# Patient Record
Sex: Female | Born: 1935 | Race: White | Hispanic: No | Smoking: Never smoker
Health system: Southern US, Community
[De-identification: ages and names within clinical notes are randomized; demographics above are authoritative.]

## PROBLEM LIST (undated history)

## (undated) DIAGNOSIS — Z923 Personal history of irradiation: Secondary | ICD-10-CM

## (undated) DIAGNOSIS — C801 Malignant (primary) neoplasm, unspecified: Secondary | ICD-10-CM

## (undated) DIAGNOSIS — I10 Essential (primary) hypertension: Secondary | ICD-10-CM

## (undated) DIAGNOSIS — E079 Disorder of thyroid, unspecified: Secondary | ICD-10-CM

## (undated) HISTORY — PX: EYE SURGERY: SHX253

## (undated) HISTORY — PX: ABDOMINAL HYSTERECTOMY: SHX81

## (undated) HISTORY — PX: BREAST BIOPSY: SHX20

## (undated) HISTORY — PX: HERNIA REPAIR: SHX51

## (undated) HISTORY — PX: KNEE SURGERY: SHX244

---

## 2005-01-23 ENCOUNTER — Other Ambulatory Visit: Payer: Self-pay

## 2005-01-23 ENCOUNTER — Emergency Department: Payer: Self-pay | Admitting: Emergency Medicine

## 2005-01-24 ENCOUNTER — Ambulatory Visit: Payer: Self-pay | Admitting: Emergency Medicine

## 2005-02-16 ENCOUNTER — Ambulatory Visit: Payer: Self-pay | Admitting: Internal Medicine

## 2005-08-05 ENCOUNTER — Ambulatory Visit: Payer: Self-pay | Admitting: Rheumatology

## 2006-02-20 ENCOUNTER — Ambulatory Visit: Payer: Self-pay | Admitting: Internal Medicine

## 2007-02-22 ENCOUNTER — Ambulatory Visit: Payer: Self-pay | Admitting: Internal Medicine

## 2007-03-17 ENCOUNTER — Ambulatory Visit: Payer: Self-pay | Admitting: Rheumatology

## 2007-04-12 ENCOUNTER — Ambulatory Visit: Payer: Self-pay | Admitting: General Practice

## 2007-04-12 ENCOUNTER — Other Ambulatory Visit: Payer: Self-pay

## 2007-04-19 ENCOUNTER — Ambulatory Visit: Payer: Self-pay | Admitting: General Practice

## 2008-02-13 ENCOUNTER — Other Ambulatory Visit: Payer: Self-pay

## 2008-02-13 ENCOUNTER — Emergency Department: Payer: Self-pay | Admitting: Emergency Medicine

## 2008-02-26 ENCOUNTER — Ambulatory Visit: Payer: Self-pay | Admitting: Internal Medicine

## 2009-02-26 ENCOUNTER — Ambulatory Visit: Payer: Self-pay | Admitting: Internal Medicine

## 2009-10-12 ENCOUNTER — Ambulatory Visit: Payer: Self-pay | Admitting: Rheumatology

## 2009-11-20 ENCOUNTER — Encounter: Admission: RE | Admit: 2009-11-20 | Discharge: 2009-11-20 | Payer: Self-pay | Admitting: Neurosurgery

## 2010-03-01 ENCOUNTER — Ambulatory Visit: Payer: Self-pay | Admitting: Internal Medicine

## 2010-03-08 ENCOUNTER — Ambulatory Visit: Payer: Self-pay | Admitting: General Practice

## 2010-03-24 ENCOUNTER — Inpatient Hospital Stay: Payer: Self-pay | Admitting: General Practice

## 2011-03-16 ENCOUNTER — Ambulatory Visit: Payer: Self-pay | Admitting: Internal Medicine

## 2011-03-19 ENCOUNTER — Ambulatory Visit: Payer: Self-pay | Admitting: Neurosurgery

## 2012-03-20 ENCOUNTER — Ambulatory Visit: Payer: Self-pay | Admitting: Internal Medicine

## 2012-10-04 ENCOUNTER — Ambulatory Visit: Payer: Self-pay | Admitting: Neurology

## 2012-10-05 ENCOUNTER — Ambulatory Visit: Payer: Self-pay | Admitting: Neurology

## 2012-10-09 ENCOUNTER — Ambulatory Visit: Payer: Self-pay | Admitting: Neurology

## 2012-11-05 ENCOUNTER — Ambulatory Visit: Payer: Self-pay | Admitting: Gastroenterology

## 2012-11-14 ENCOUNTER — Ambulatory Visit: Payer: Self-pay | Admitting: Gastroenterology

## 2012-12-03 ENCOUNTER — Ambulatory Visit: Payer: Self-pay | Admitting: Surgery

## 2012-12-03 DIAGNOSIS — I10 Essential (primary) hypertension: Secondary | ICD-10-CM

## 2012-12-03 LAB — CBC
HGB: 12.5 g/dL (ref 12.0–16.0)
MCHC: 34.8 g/dL (ref 32.0–36.0)
RBC: 4.01 10*6/uL (ref 3.80–5.20)
WBC: 5 10*3/uL (ref 3.6–11.0)

## 2012-12-14 ENCOUNTER — Inpatient Hospital Stay: Payer: Self-pay | Admitting: Surgery

## 2012-12-15 LAB — BASIC METABOLIC PANEL
Calcium, Total: 9 mg/dL (ref 8.5–10.1)
Chloride: 99 mmol/L (ref 98–107)
Creatinine: 0.62 mg/dL (ref 0.60–1.30)
EGFR (Non-African Amer.): >60
Glucose: 108 mg/dL — ABNORMAL HIGH (ref 65–99)
Potassium: 3.9 mmol/L (ref 3.5–5.1)
Sodium: 134 mmol/L — ABNORMAL LOW (ref 136–145)

## 2013-04-05 ENCOUNTER — Emergency Department: Payer: Self-pay | Admitting: Emergency Medicine

## 2013-04-05 LAB — CBC WITH DIFFERENTIAL/PLATELET
Basophil #: 0 10*3/uL (ref 0.0–0.1)
Eosinophil %: 4.6 %
HCT: 37.2 % (ref 35.0–47.0)
Lymphocyte #: 1.1 10*3/uL (ref 1.0–3.6)
Neutrophil %: 64.6 %
Platelet: 214 10*3/uL (ref 150–440)

## 2013-04-05 LAB — COMPREHENSIVE METABOLIC PANEL
Albumin: 3.2 g/dL — ABNORMAL LOW (ref 3.4–5.0)
Alkaline Phosphatase: 98 U/L (ref 50–136)
Anion Gap: 5 — ABNORMAL LOW (ref 7–16)
BUN: 14 mg/dL (ref 7–18)
Bilirubin,Total: 0.4 mg/dL (ref 0.2–1.0)
Chloride: 98 mmol/L (ref 98–107)
Co2: 30 mmol/L (ref 21–32)
Creatinine: 0.94 mg/dL (ref 0.60–1.30)
EGFR (African American): >60
EGFR (Non-African Amer.): 58 — ABNORMAL LOW
Osmolality: 267 (ref 275–301)
Total Protein: 6.7 g/dL (ref 6.4–8.2)

## 2013-04-05 LAB — URINALYSIS, COMPLETE
Bacteria: NONE SEEN
Bilirubin,UR: NEGATIVE
Blood: NEGATIVE
RBC,UR: 1 /HPF (ref 0–5)

## 2013-04-09 ENCOUNTER — Emergency Department: Payer: Self-pay | Admitting: Emergency Medicine

## 2013-04-09 LAB — URINALYSIS, COMPLETE
Bacteria: NONE SEEN
Glucose,UR: NEGATIVE mg/dL (ref 0–75)
Hyaline Cast: 3
Ketone: NEGATIVE
Protein: NEGATIVE

## 2013-04-09 LAB — MAGNESIUM: Magnesium: 1.4 mg/dL — ABNORMAL LOW

## 2013-04-09 LAB — COMPREHENSIVE METABOLIC PANEL
Albumin: 3.2 g/dL — ABNORMAL LOW (ref 3.4–5.0)
Alkaline Phosphatase: 100 U/L (ref 50–136)
Anion Gap: 5 — ABNORMAL LOW (ref 7–16)
EGFR (Non-African Amer.): 52 — ABNORMAL LOW
Glucose: 74 mg/dL (ref 65–99)

## 2013-04-09 LAB — CBC WITH DIFFERENTIAL/PLATELET
Basophil #: 0.1 10*3/uL (ref 0.0–0.1)
Eosinophil #: 0.6 10*3/uL (ref 0.0–0.7)
MCHC: 35 g/dL (ref 32.0–36.0)
MCV: 89 fL (ref 80–100)
Neutrophil #: 4.3 10*3/uL (ref 1.4–6.5)
Neutrophil %: 57.5 %
RBC: 4.09 10*6/uL (ref 3.80–5.20)
WBC: 7.4 10*3/uL (ref 3.6–11.0)

## 2013-04-10 LAB — WBCS, STOOL

## 2013-04-24 ENCOUNTER — Other Ambulatory Visit: Payer: Self-pay | Admitting: Gastroenterology

## 2013-04-24 LAB — CLOSTRIDIUM DIFFICILE BY PCR

## 2013-05-16 ENCOUNTER — Other Ambulatory Visit: Payer: Self-pay | Admitting: Gastroenterology

## 2013-05-16 LAB — CLOSTRIDIUM DIFFICILE BY PCR

## 2013-05-24 ENCOUNTER — Other Ambulatory Visit: Payer: Self-pay | Admitting: Gastroenterology

## 2013-05-24 LAB — CLOSTRIDIUM DIFFICILE BY PCR

## 2014-05-23 ENCOUNTER — Ambulatory Visit: Payer: Self-pay | Admitting: Family Medicine

## 2014-05-26 DIAGNOSIS — M5030 Other cervical disc degeneration, unspecified cervical region: Secondary | ICD-10-CM | POA: Insufficient documentation

## 2014-05-26 DIAGNOSIS — M5412 Radiculopathy, cervical region: Secondary | ICD-10-CM | POA: Insufficient documentation

## 2014-05-26 DIAGNOSIS — M47812 Spondylosis without myelopathy or radiculopathy, cervical region: Secondary | ICD-10-CM | POA: Insufficient documentation

## 2014-06-11 DIAGNOSIS — I10 Essential (primary) hypertension: Secondary | ICD-10-CM | POA: Diagnosis present

## 2014-06-11 DIAGNOSIS — K219 Gastro-esophageal reflux disease without esophagitis: Secondary | ICD-10-CM | POA: Insufficient documentation

## 2014-07-07 DIAGNOSIS — R002 Palpitations: Secondary | ICD-10-CM | POA: Insufficient documentation

## 2014-09-15 DIAGNOSIS — M1990 Unspecified osteoarthritis, unspecified site: Secondary | ICD-10-CM | POA: Insufficient documentation

## 2014-09-15 DIAGNOSIS — E039 Hypothyroidism, unspecified: Secondary | ICD-10-CM | POA: Diagnosis present

## 2014-11-28 DIAGNOSIS — M4807 Spinal stenosis, lumbosacral region: Secondary | ICD-10-CM | POA: Insufficient documentation

## 2014-11-28 DIAGNOSIS — M5416 Radiculopathy, lumbar region: Secondary | ICD-10-CM | POA: Insufficient documentation

## 2014-12-11 ENCOUNTER — Ambulatory Visit: Payer: Self-pay | Admitting: Family Medicine

## 2015-04-03 NOTE — Discharge Summary (Signed)
PATIENT NAME:  Jacqueline Tate, Jacqueline Tate MR#:  [id number] DATE OF BIRTH:  01-21-1931  DATE OF ADMISSION:  12/14/2012 DATE OF DISCHARGE:  12/17/2012  HISTORY OF PRESENT ILLNESS: This is a 81 year old female who has a history of epigastric pain and some dysphagia, heartburn and choking sensation. She has had endoscopic findings of large paraesophageal hiatus hernia and due to the magnitude of symptoms, repair was recommended for definitive treatment. Details of past medical history and medicines are recorded on the typed H and [place] [place]: She was brought in through the outpatient surgery department and went to the Operating Room, where she had a laparoscopic hiatus hernia repair and did use Gore-Tex Bio-A mesh and also did fundoplication.   Postoperatively, she was kept in the hospital for a period of 3 days of observation. She was begun on a liquid diet and gradually advanced to a full liquid diet, which she tolerated satisfactorily. She gradually increased her activities and walking, had minimal postoperative pain.   FINAL DIAGNOSES:  1.  Hiatus hernia. 2.  Gastroesophageal reflux.   OPERATION: Laparoscopic hiatus hernia repair with fundoplication.   DISCHARGE INSTRUCTIONS: Wound care instructions given. Glue remains intact. She may shower. She was advised to gradually advance her diet, chew thoroughly, also to continue her Dexilant for approximately a week post discharge, and plans made for followup in the office.   ____________________________ J. Rochel Brome, MD [email or web address] D: 12/27/2012 17:57:54 ET T: 12/27/2012 21:21:05 ET JOB#: [id number]  cc: Loreli Dollar, MD, <Dictator> Loreli Dollar MD ELECTRONICALLY SIGNED 12/28/2012 16:58

## 2015-04-03 NOTE — Op Note (Signed)
PATIENT NAME:  Jacqueline Tate, Jacqueline Tate MR#:  [id number] DATE OF BIRTH:  [birth year]  DATE OF PROCEDURE:  12/14/2012  PREOPERATIVE DIAGNOSIS: Paraesophageal hiatus hernia with chronic gastroesophageal reflux.   POSTOPERATIVE DIAGNOSIS: Paraesophageal hiatus hernia with chronic gastroesophageal reflux.   PROCEDURE: Laparoscopic hiatus hernia repair with Nissen fundoplication.   SURGEON: Rochel Brome, M.D.   ANESTHESIA: General.   INDICATIONS: This 81 year old female has been having problems with heartburn and choking sensation and episodes of epigastric pain gradually getting worse. She has been taking Dexalon  which helps with having heartburn. A barium swallow demonstrated a large paraesophageal hiatus hernia. It appeared that half or two thirds of her stomach extended up into the chest just anterior to the esophagus, and after evaluation surgery was recommended for definitive treatment.   DESCRIPTION OF PROCEDURE: The patient was placed on the operating table in the supine position under general endotracheal anesthesia. The abdomen was prepared with ChloraPrep and draped in a sterile manner.   A short incision was made in the inferior aspect of the umbilicus and carried down to the deep fascia which was grasped with laryngeal hook and elevated. A Veress needle was inserted, aspirated, and irrigated with a saline solution. Next, the peritoneal cavity was inflated with carbon dioxide. The Veress needle was removed. The 10 mm cannula was inserted. The 10 mm, 25 degrees laparoscope was inserted to view the peritoneal cavity. The liver appeared normal. The patient was placed in the reverse Trendelenburg position. Another incision was made in the subxiphoid position to insert an 11 mm cannula. Another incision was made in the left upper quadrant at the costal margin at the anterior axillary line to insert an 11 mm cannula. Another cannula was inserted in the right upper quadrant at the costal margin at the  midclavicular line, another midway between that point and the umbilicus, and ultimately another in the right upper quadrant at the anterior axillary line for a total of six cannulas. The fan retractor was introduced through the subxiphoid port and held in place with the Bookwalter mechanical arm to retract the left lobe of the liver in an anterior direction, exposing the diaphragmatic hiatus, most of the stomach, probably three-fourths of the stomach was in the chest. Babcock clamps were used to apply traction to the stomach, delivering a portion of it down into the abdomen. This subsequently needed to carry out this dissection to mobilize the stomach. The Harmonic scalpel was used to incise the gastrohepatic ligament down the right side and also incise the hernia sac circumferentially. The esophagus was found extending down into the posterior aspect of the hiatus, and with additional dissection, the stomach was mobilized down into the abdominal cavity, and circumferential dissection was carried out around the esophagus. The left and right crurea were identified. The hiatal hernia defect was large in size. Next, the repair was begun with 0 Surgidac sutures, figure-of-eight sutures, suturing the crurea together and beginning posteriorly and extending in an anterior direction. There was some tension appreciated, and I did make a relaxing incision in the crurea on the patient's right side which allowed better mobilization of the diaphragm, and the repair was continued; however, there was a point in which the diaphragm could no longer be brought together, as far dissecting more anteriorly and placing sutures, and it appeared that a portion of the gap would need to be bridged and I elected to use the Gore-Tex Bio-A 7 x 10 cm mesh. This did already come with the U-shaped slot cut  out to straddle the esophagus and this U-shaped slot was lengthened about another centimeter. The mesh was soaked and was inserted in place so  that the U-shaped slot faced anterior and to the left. The mesh was sutured to the crurea and also sutured to the left side of the diaphragm and subsequently the right side of the diaphragm and also between sutures applied multiple staples to further secure it to the diaphragm. This is also was sutured to the right of the relaxing incision in the right crus, and subsequently it was determined that the mesh was adequately secured. Next, the fundoplication was carried out, passing a portion of fundus posterior to the esophagus from left to right., and another portion of fundus brought adjacent to this. The fundus was plicated with interrupted 0 Surgidac simple sutures, placing 3, and incorporating the wall the esophagus into this plication. It is noted that hemostasis was intact. Blood loss for the entire case is very minimal and mostly in the skin edges. All the ports were removed. It is further noted that the patient did develop quite a bit of subcutaneous emphysema of the neck and I suspect that this was just carbon dioxide emphysema up through the mediastinum, and once the abdomen was deflated, we could soon determine that subcutaneous emphysema was starting to improve. Each of the skin incisions were closed with interrupted 5-0 chromic subcuticular sutures and Dermabond.      The patient was kept intubated as she was carried to the recovery room anticipating that the subcutaneous emphysema would soon resolve and soon be extubated. The patient tolerated the procedure satisfactorily other than that subcutaneous emphysema.    ____________________________ J. Rochel Brome, MD [email or web address] D: 12/14/2012 20:38:09 ET T: 12/15/2012 10:42:14 ET JOB#: [id number]  cc: Loreli Dollar, MD, <Dictator> Loreli Dollar MD ELECTRONICALLY SIGNED 12/17/2012 19:32

## 2015-07-18 ENCOUNTER — Emergency Department: Payer: Medicare Other

## 2015-07-18 ENCOUNTER — Other Ambulatory Visit: Payer: Self-pay

## 2015-07-18 ENCOUNTER — Encounter: Payer: Self-pay | Admitting: Emergency Medicine

## 2015-07-18 ENCOUNTER — Emergency Department
Admission: EM | Admit: 2015-07-18 | Discharge: 2015-07-18 | Disposition: A | Discharge status: Home / Self Care | Payer: Medicare Other | Attending: Emergency Medicine | Admitting: Emergency Medicine

## 2015-07-18 DIAGNOSIS — Z79899 Other long term (current) drug therapy: Secondary | ICD-10-CM | POA: Diagnosis not present

## 2015-07-18 DIAGNOSIS — K5732 Diverticulitis of large intestine without perforation or abscess without bleeding: Secondary | ICD-10-CM | POA: Insufficient documentation

## 2015-07-18 DIAGNOSIS — F419 Anxiety disorder, unspecified: Secondary | ICD-10-CM | POA: Insufficient documentation

## 2015-07-18 DIAGNOSIS — Z880 Allergy status to penicillin: Secondary | ICD-10-CM | POA: Diagnosis not present

## 2015-07-18 DIAGNOSIS — I10 Essential (primary) hypertension: Secondary | ICD-10-CM | POA: Insufficient documentation

## 2015-07-18 DIAGNOSIS — Z91040 Latex allergy status: Secondary | ICD-10-CM | POA: Insufficient documentation

## 2015-07-18 DIAGNOSIS — R109 Unspecified abdominal pain: Secondary | ICD-10-CM | POA: Diagnosis present

## 2015-07-18 HISTORY — DX: Essential (primary) hypertension: I10

## 2015-07-18 HISTORY — DX: Malignant (primary) neoplasm, unspecified: C80.1

## 2015-07-18 HISTORY — DX: Disorder of thyroid, unspecified: E07.9

## 2015-07-18 LAB — URINALYSIS COMPLETE WITH MICROSCOPIC (ARMC ONLY)
BILIRUBIN URINE: NEGATIVE
Bacteria, UA: NONE SEEN
GLUCOSE, UA: NEGATIVE mg/dL
Leukocytes, UA: NEGATIVE
NITRITE: NEGATIVE
PH: 7 (ref 5.0–8.0)
PROTEIN: 30 mg/dL — AB
SPECIFIC GRAVITY, URINE: 1.013 (ref 1.005–1.030)
Squamous Epithelial / LPF: NONE SEEN

## 2015-07-18 LAB — CBC WITH DIFFERENTIAL/PLATELET
BASOS ABS: 0.1 10*3/uL (ref 0–0.1)
BASOS PCT: 1 %
EOS ABS: 0.2 10*3/uL (ref 0–0.7)
EOS PCT: 2 %
HEMATOCRIT: 45.6 % (ref 35.0–47.0)
Hemoglobin: 16 g/dL (ref 12.0–16.0)
Lymphocytes Relative: 19 %
Lymphs Abs: 1.9 10*3/uL (ref 1.0–3.6)
MCH: 31.4 pg (ref 26.0–34.0)
MCHC: 35 g/dL (ref 32.0–36.0)
MCV: 89.7 fL (ref 80.0–100.0)
MONO ABS: 0.8 10*3/uL (ref 0.2–0.9)
MONOS PCT: 8 %
Neutro Abs: 7.3 10*3/uL — ABNORMAL HIGH (ref 1.4–6.5)
Neutrophils Relative %: 70 %
PLATELETS: 241 10*3/uL (ref 150–440)
RBC: 5.09 MIL/uL (ref 3.80–5.20)
RDW: 13.8 % (ref 11.5–14.5)
WBC: 10.3 10*3/uL (ref 3.6–11.0)

## 2015-07-18 LAB — BASIC METABOLIC PANEL
ANION GAP: 14 (ref 5–15)
BUN: 18 mg/dL (ref 6–20)
CALCIUM: 11.3 mg/dL — AB (ref 8.9–10.3)
CHLORIDE: 89 mmol/L — AB (ref 101–111)
CO2: 27 mmol/L (ref 22–32)
CREATININE: 0.8 mg/dL (ref 0.44–1.00)
GFR calc Af Amer: >60 mL/min (ref >60)
GFR calc non Af Amer: >60 mL/min (ref >60)
Glucose, Bld: 144 mg/dL — ABNORMAL HIGH (ref 65–99)
POTASSIUM: 2.8 mmol/L — AB (ref 3.5–5.1)
Sodium: 130 mmol/L — ABNORMAL LOW (ref 135–145)

## 2015-07-18 MED ORDER — IOHEXOL 350 MG/ML SOLN
75.0000 mL | Freq: Once | INTRAVENOUS | Status: AC | PRN
  Administered 2015-07-18: 75 mL via INTRAVENOUS

## 2015-07-18 MED ORDER — METRONIDAZOLE 500 MG PO TABS: 500.0000 mg | ORAL_TABLET | Freq: Three times a day (TID) | ORAL | Status: AC

## 2015-07-18 MED ORDER — LORAZEPAM 2 MG/ML IJ SOLN
0.5000 mg | Freq: Once | INTRAMUSCULAR | Status: AC
  Administered 2015-07-18: 0.5 mg via INTRAVENOUS
  Filled 2015-07-18 (×2): qty 1

## 2015-07-18 MED ORDER — CLONIDINE HCL 0.1 MG PO TABS: 0.1000 mg | ORAL_TABLET | Freq: Two times a day (BID) | ORAL | Status: DC | PRN

## 2015-07-18 MED ORDER — IOHEXOL 240 MG/ML SOLN
25.0000 mL | Freq: Once | INTRAMUSCULAR | Status: AC | PRN
  Administered 2015-07-18: 25 mL via ORAL

## 2015-07-18 MED ORDER — HYDROCODONE-ACETAMINOPHEN 5-325 MG PO TABS: 1.0000 | ORAL_TABLET | Freq: Four times a day (QID) | ORAL | Status: AC | PRN

## 2015-07-18 MED ORDER — POTASSIUM CHLORIDE CRYS ER 20 MEQ PO TBCR
20.0000 meq | EXTENDED_RELEASE_TABLET | Freq: Once | ORAL | Status: AC
  Administered 2015-07-18: 20 meq via ORAL
  Filled 2015-07-18: qty 1

## 2015-07-18 MED ORDER — CIPROFLOXACIN HCL 500 MG PO TABS
500.0000 mg | ORAL_TABLET | Freq: Two times a day (BID) | ORAL | Status: AC
Start: 2015-07-18 — End: 2015-07-25

## 2015-07-18 MED ORDER — MORPHINE SULFATE 2 MG/ML IJ SOLN
2.0000 mg | Freq: Once | INTRAMUSCULAR | Status: AC
  Administered 2015-07-18: 2 mg via INTRAVENOUS
  Filled 2015-07-18: qty 1

## 2015-07-18 MED ORDER — ONDANSETRON HCL 4 MG/2ML IJ SOLN
4.0000 mg | Freq: Once | INTRAMUSCULAR | Status: AC
  Administered 2015-07-18: 4 mg via INTRAVENOUS
  Filled 2015-07-18: qty 2

## 2015-07-18 NOTE — ED Notes (Signed)
Patient with no complaints at this time. Respirations even and unlabored. Skin warm/dry. Discharge instructions reviewed with patient at this time. Patient given opportunity to voice concerns/ask questions. IV removed per policy and band-aid applied to site. Patient discharged at this time and left Emergency Department, via wheelchair.   

## 2015-07-18 NOTE — Discharge Instructions (Signed)
Diverticulitis Diverticulitis is when small pockets that have formed in your colon (large intestine) become infected or swollen. HOME CARE  Follow your doctor's instructions.  Follow a special diet if told by your doctor.  When you feel better, your doctor may tell you to change your diet. You may be told to eat a lot of fiber. Fruits and vegetables are good sources of fiber. Fiber makes it easier to poop (have bowel movements).  Take supplements or probiotics as told by your doctor.  Only take medicines as told by your doctor.  Keep all follow-up visits with your doctor. GET HELP IF:  Your pain does not get better.  You have a hard time eating food.  You are not pooping like normal. GET HELP RIGHT AWAY IF:  Your pain gets worse.  Your problems do not get better.  Your problems suddenly get worse.  You have a fever.  You keep throwing up (vomiting).  You have bloody or black, tarry poop (stool). MAKE SURE YOU:   Understand these instructions.  Will watch your condition.  Will get help right away if you are not doing well or get worse. Document Released: 05/16/2008 Document Revised: 12/03/2013 Document Reviewed: 10/23/2013 [facility] Patient Information 2015 [place], [place]. This information is not intended to replace advice given to you by your health care provider. Make sure you discuss any questions you have with your health care provider.  Return immediately if condition worsens. Mostly liquid diet to rest the bowel. Return especially for increased pain, fever, uncontrolled blood pressure or any other new concerns. Only take the clonidine as needed for your blood pressure. He is continue on her normal blood pressure medications

## 2015-07-18 NOTE — ED Notes (Signed)
Pt complaining of her blood pressure being elevated , this AM with , also complaining of left flank pain and nausea

## 2015-07-18 NOTE — ED Provider Notes (Signed)
[facility] Emergency Department Provider Note  ____________________________________________  Time seen: 1900  I have reviewed the triage vital signs and the nursing notes.   HISTORY  Chief Complaint Flank Pain and Hypertension    HPI Jacqueline Tate is a 81 y.o. female was brought here by family for evaluation for possible urinary tract infection. Patient's also noted that her blood pressures been elevated throughout the day. She has taken her normal blood pressure medications along with some extra amlodipine. The patient signed describing some left-sided lower abdominal pain. She states she's had some urinary frequency without somewhat foul-smelling urine. She apparently was treated for urinary tract infection 3 weeks ago with antibiotics. Patient denies any fever or chills. Patient did have some nausea and vomited 2 prior to arrival without any biliary emesis or hematemesis. Patient denies any hematuria. She denies any chest pain or shortness of breath.    Past Medical History  Diagnosis Date  . Hypertension   . Thyroid disease   . Cancer     skin   urinary tract infection  There are no active problems to display for this patient.   Past Surgical History  Procedure Laterality Date  . Abdominal hysterectomy    . Eye surgery    . Hernia repair    . Knee surgery      Current Outpatient Rx  Name  Route  Sig  Dispense  Refill  . ciprofloxacin (CIPRO) 500 MG tablet   Oral   Take 1 tablet (500 mg total) by mouth 2 (two) times daily.   14 tablet   0   . cloNIDine (CATAPRES) 0.1 MG tablet   Oral   Take 1 tablet (0.1 mg total) by mouth 2 (two) times daily as needed.   14 tablet   11   . HYDROcodone-acetaminophen (NORCO) 5-325 MG per tablet   Oral   Take 1 tablet by mouth every 6 (six) hours as needed for moderate pain.   20 tablet   0   . metroNIDAZOLE (FLAGYL) 500 MG tablet   Oral   Take 1 tablet (500 mg total) by mouth 3 (three) times  daily.   21 tablet   0     Allergies Codeine; Latex; Penicillins; and Sulfa antibiotics  No family history on file.  Social History History  Substance Use Topics  . Smoking status: Never Smoker   . Smokeless tobacco: Not on file  . Alcohol Use: Not on file    Review of Systems  Constitutional: Negative for fever. Eyes: Negative for visual changes. ENT: Negative for sore throat Cardiovascular: Negative for chest pain. Respiratory: Negative for shortness of breath. Gastrointestinal: Patient describes left lower abdominal pain with some mild radiation of pain to the back region (see above). Genitourinary: She describes some burning and urinary frequency without hematuria and Musculoskeletal: Negative for back pain. Skin: Negative for rash. Neurological: Negative for headaches or focal weakness Psychiatric: Patient appears anxious and appears uncomfortable    ____________________________________________   PHYSICAL EXAM:  VITAL SIGNS: ED Triage Vitals  Enc Vitals Group     BP 07/18/15 1711 182/106 mmHg     Pulse Rate 07/18/15 1711 104     Resp 07/18/15 1711 20     Temp 07/18/15 1711 97.4 F (36.3 C)     Temp Source 07/18/15 1711 Oral     SpO2 07/18/15 1712 98 %     Weight 07/18/15 1712 150 lb (68.04 kg)     Height 07/18/15 1712  5\' 8"  (1.727 m)     Head Cir --      Peak Flow --      Pain Score 07/18/15 1713 8     Pain Loc --      Pain Edu? --      Excl. in [place]? --      Constitutional: Alert and oriented. Well appearing and in no distress. Eyes: Conjunctivae are normal.  ENT   Head: Normocephalic and atraumatic.   Mouth/Throat: Mucous membranes are moist. Cardiovascular: Normal rate, regular rhythm. Normal and symmetric distal pulses are present in all extremities. No murmurs, rubs, or gallops. Respiratory: Normal respiratory effort without tachypnea nor retractions. Breath sounds are clear and equal bilaterally.  Gastrointestinal: Mild tenderness to  deep palpation without any peritoneal signs such as rebound, guarding, or rigidity. Musculoskeletal: Nontender with normal range of motion in all extremities. No lower extremity tenderness nor edema. Neurologic:  Normal speech and language. No gross focal neurologic deficits are appreciated. Skin:  Skin is warm, dry and intact. No rash noted. Psychiatric: Mood and affect are normal. Patient exhibits appropriate insight and judgment.  ____________________________________________    LABS (pertinent positives/negatives)  Labs Reviewed  URINALYSIS COMPLETEWITH MICROSCOPIC ([facility] ONLY) - Abnormal; Notable for the following:    Color, Urine YELLOW (*)    APPearance CLOUDY (*)    Ketones, ur TRACE (*)    Hgb urine dipstick 1+ (*)    Protein, ur 30 (*)    All other components within normal limits  BASIC METABOLIC PANEL - Abnormal; Notable for the following:    Sodium 130 (*)    Potassium 2.8 (*)    Chloride 89 (*)    Glucose, Bld 144 (*)    Calcium 11.3 (*)    All other components within normal limits  CBC WITH DIFFERENTIAL/PLATELET - Abnormal; Notable for the following:    Neutro Abs 7.3 (*)    All other components within normal limits    ___Laboratory work was reviewed and noted to have significant hypokalemia. Patient was given 20 mEq of oral potassium here in emergency department _________________________________________   EKG  ED ECG REPORT I, Daymon Larsen, the attending physician, personally viewed and interpreted this ECG.  Date: 07/18/2015 EKG Time: 1743 Rate: 87 Rhythm: normal sinus rhythm QRS Axis: normal Intervals: normal ST/T Wave abnormalities: normal Conduction Disutrbances: none Narrative Interpretation: Left ventricular hypertrophy Old inferior infarct no acute ischemic changes are noted   ____________________________________________    RADIOLOGY I have personally reviewed any xrays that were ordered on this patient: Abdominal pelvic CT with  contrast   _______________________ IMPRESSION: 1. Suggestion of slight wall thickening along the descending colon, and slight soft tissue inflammation about the proximal sigmoid colon, concerning for mild diverticulitis. No evidence of perforation or abscess formation at this time. 2. Relatively diffuse diverticulosis along the descending and sigmoid colon. 3. Small right renal cyst seen. 4. Scattered calcification along the abdominal aorta and its branches._____________________   ____________________________________________   INITIAL IMPRESSION / ASSESSMENT AND PLAN / ED COURSE  Pertinent labs & imaging results that were available during my care of the patient were reviewed by me and considered in my medical decision making (see chart for details).  ED course: The patient's stay was uneventful. She was given some IV fluids and morphine for pain and felt symptomatically improved. Her blood pressure was decreased steadily since her arrival here to emergency department. (See nurses note). Patient I felt developed some left-sided lower abdominal pain which  likely increased her blood pressure. She normally has adequate control of her blood pressure and did have support backup medication she does not appear to have any signs or symptoms secondary to hypertensive emergency. Patient was given instructions on diverticulitis and that she'll need follow-up with her primary physician especially if she has any persistent pain and also recheck on her potassium levels. She has had hypokalemia in the past and usually resolves with bananas and other potassium-containing foods. Patient was given prescriptions for Cipro, Flagyl, Vicodin for pain, and clonidine as a secondary backup medication for hypertension.  ____________________________________________   FINAL CLINICAL IMPRESSION(S) / ED DIAGNOSES  Final diagnoses:  Diverticulitis of large intestine without perforation or abscess without bleeding    hypertensive urgency   Daymon Larsen, MD 07/18/15 2138

## 2015-07-18 NOTE — ED Notes (Signed)
Possible UTI with increased urination and increased smell

## 2015-07-18 NOTE — ED Notes (Signed)
Patient transported to CT 

## 2015-10-22 ENCOUNTER — Other Ambulatory Visit: Payer: Self-pay | Admitting: Family Medicine

## 2015-10-22 DIAGNOSIS — Z1231 Encounter for screening mammogram for malignant neoplasm of breast: Secondary | ICD-10-CM

## 2015-11-17 ENCOUNTER — Ambulatory Visit: Payer: PRIVATE HEALTH INSURANCE

## 2016-07-09 ENCOUNTER — Emergency Department: Payer: Medicare Other

## 2016-07-09 ENCOUNTER — Emergency Department
Admission: EM | Admit: 2016-07-09 | Discharge: 2016-07-09 | Disposition: A | Discharge status: Home / Self Care | Payer: Medicare Other | Attending: Emergency Medicine | Admitting: Emergency Medicine

## 2016-07-09 DIAGNOSIS — R000 Tachycardia, unspecified: Secondary | ICD-10-CM | POA: Diagnosis not present

## 2016-07-09 DIAGNOSIS — Z7982 Long term (current) use of aspirin: Secondary | ICD-10-CM | POA: Insufficient documentation

## 2016-07-09 DIAGNOSIS — Z91040 Latex allergy status: Secondary | ICD-10-CM | POA: Diagnosis not present

## 2016-07-09 DIAGNOSIS — Z791 Long term (current) use of non-steroidal anti-inflammatories (NSAID): Secondary | ICD-10-CM | POA: Diagnosis not present

## 2016-07-09 DIAGNOSIS — I10 Essential (primary) hypertension: Secondary | ICD-10-CM | POA: Diagnosis not present

## 2016-07-09 DIAGNOSIS — Z85828 Personal history of other malignant neoplasm of skin: Secondary | ICD-10-CM | POA: Diagnosis not present

## 2016-07-09 DIAGNOSIS — Z79899 Other long term (current) drug therapy: Secondary | ICD-10-CM | POA: Insufficient documentation

## 2016-07-09 DIAGNOSIS — R030 Elevated blood-pressure reading, without diagnosis of hypertension: Secondary | ICD-10-CM

## 2016-07-09 DIAGNOSIS — IMO0001 Reserved for inherently not codable concepts without codable children: Secondary | ICD-10-CM

## 2016-07-09 LAB — COMPREHENSIVE METABOLIC PANEL
ALK PHOS: 134 U/L — AB (ref 38–126)
ALT: 23 U/L (ref 14–54)
AST: 28 U/L (ref 15–41)
Albumin: 4.3 g/dL (ref 3.5–5.0)
Anion gap: 7 (ref 5–15)
BUN: 20 mg/dL (ref 6–20)
CALCIUM: 9.5 mg/dL (ref 8.9–10.3)
CHLORIDE: 102 mmol/L (ref 101–111)
CO2: 28 mmol/L (ref 22–32)
CREATININE: 0.93 mg/dL (ref 0.44–1.00)
GFR calc Af Amer: >60 mL/min (ref >60)
GFR calc non Af Amer: 55 mL/min — ABNORMAL LOW (ref >60)
GLUCOSE: 52 mg/dL — AB (ref 65–99)
Potassium: 3.3 mmol/L — ABNORMAL LOW (ref 3.5–5.1)
SODIUM: 137 mmol/L (ref 135–145)
Total Bilirubin: 0.3 mg/dL (ref 0.3–1.2)
Total Protein: 7.8 g/dL (ref 6.5–8.1)

## 2016-07-09 LAB — CBC
HCT: 41.5 % (ref 35.0–47.0)
HEMOGLOBIN: 14.7 g/dL (ref 12.0–16.0)
MCH: 32.7 pg (ref 26.0–34.0)
MCHC: 35.5 g/dL (ref 32.0–36.0)
MCV: 92.3 fL (ref 80.0–100.0)
PLATELETS: 197 10*3/uL (ref 150–440)
RBC: 4.49 MIL/uL (ref 3.80–5.20)
RDW: 13.5 % (ref 11.5–14.5)
WBC: 8.7 10*3/uL (ref 3.6–11.0)

## 2016-07-09 LAB — TROPONIN I: Troponin I: <0.03 ng/mL (ref <0.03)

## 2016-07-09 MED ORDER — SODIUM CHLORIDE 0.9 % IV SOLN
1000.0000 mL | Freq: Once | INTRAVENOUS | Status: AC
  Administered 2016-07-09: 1000 mL via INTRAVENOUS

## 2016-07-09 NOTE — ED Provider Notes (Signed)
[facility] Emergency Department Provider Note   ____________________________________________    I have reviewed the triage vital signs and the nursing notes.   HISTORY  Chief Complaint Tachycardia and Hypertension     HPI Jacqueline Tate is a 82 y.o. female who presents with complaints of high blood pressure and elevated heart rate. Patient reports she frequently checks her blood pressure in the morning and noted over the last 2 days it has been elevated. She checked it this morning because she "felt jittery" and it was elevated and her heart rate was also elevated. She decided to come to the emergency department to be "checked out". She denies chest pain. No palpitations. No shortness of breath. Overall she feels well now. She follows with her PCP who manages her blood pressure medications. No fevers or chills. No recent travel. No calf pain or swelling.   Past Medical History:  Diagnosis Date  . Cancer ([place])    skin  . Hypertension   . Thyroid disease     There are no active problems to display for this patient.   Past Surgical History:  Procedure Laterality Date  . ABDOMINAL HYSTERECTOMY    . EYE SURGERY    . HERNIA REPAIR    . KNEE SURGERY      Prior to Admission medications   Medication Sig Start Date End Date Taking? Authorizing Provider  amLODipine (NORVASC) 5 MG tablet Take 1 tablet by mouth daily. 06/15/16  Yes Historical Provider, MD  aspirin 81 MG tablet Take 81 mg by mouth daily.   Yes Historical Provider, MD  cyclobenzaprine (FLEXERIL) 5 MG tablet Take 5 mg by mouth at bedtime.   Yes Historical Provider, MD  docusate sodium (COLACE) 100 MG capsule Take 100 mg by mouth daily.   Yes Historical Provider, MD  gabapentin (NEURONTIN) 600 MG tablet Take 1 tablet by mouth 3 (three) times daily. 05/27/16  Yes Historical Provider, MD  levothyroxine (SYNTHROID, LEVOTHROID) 50 MCG tablet Take 1 tablet by mouth daily. 05/27/16  Yes Historical  Provider, MD  lisinopril-hydrochlorothiazide (PRINZIDE,ZESTORETIC) 20-12.5 MG tablet Take 1 tablet by mouth daily. 05/13/16  Yes Historical Provider, MD  meloxicam (MOBIC) 7.5 MG tablet Take 1 tablet by mouth 2 (two) times daily. 05/27/16  Yes Historical Provider, MD  metoCLOPramide (REGLAN) 10 MG tablet Take 5 mg by mouth daily. 05/27/16  Yes Historical Provider, MD  metoprolol tartrate (LOPRESSOR) 25 MG tablet Take 1 tablet by mouth 2 (two) times daily. 05/27/16  Yes Historical Provider, MD  Omeprazole 20 MG TBEC Take 10-20 mg by mouth daily. Take 10 mg by mouth in the morning, take 20 mg by mouth at night   Yes Historical Provider, MD  Probiotic Product ([place]) Take 1 capsule by mouth daily.   Yes Historical Provider, MD  traZODone (DESYREL) 50 MG tablet Take 50 mg by mouth daily. 04/19/16  Yes Historical Provider, MD  DULoxetine (CYMBALTA) 60 MG capsule Take 1 capsule by mouth daily. 05/27/16   Historical Provider, MD  fluticasone (FLONASE) 50 MCG/ACT nasal spray Place 1 spray into both nostrils daily as needed. 06/17/16   Historical Provider, MD  .   Allergies Codeine; Latex; Penicillins; and Sulfa antibiotics  History reviewed. No pertinent family history.  Social History Social History  Substance Use Topics  . Smoking status: Never Smoker  . Smokeless tobacco: Not on file  . Alcohol use Not on file    Review of Systems  Constitutional: No  fever/chills   Cardiovascular: Denies chest pain. Respiratory: Denies shortness of breath. Gastrointestinal: No abdominal pain.  No nausea, no vomiting.   Genitourinary: Negative for dysuria. Musculoskeletal: Negative for Neck pain Skin: Negative for rash. Neurological: Negative for headaches   10-point ROS otherwise negative.  ____________________________________________   PHYSICAL EXAM:  VITAL SIGNS: ED Triage Vitals  Enc Vitals Group     BP (!) 156/109     Pulse Rate (!) 107     Resp 18     Temp 97.8 F (36.6 C)      Temp Source Oral     SpO2 98 %     Weight 148 lb (67.1 kg)     Height 5\' 7"  (1.702 m)     Head Circumference      Peak Flow      Pain Score      Pain Loc      Pain Edu?      Excl. in [place]?     Constitutional: Alert and oriented. No acute distress. Pleasant and interactive Eyes: Conjunctivae are normal.  Head: Atraumatic. Nose: No congestion/rhinnorhea. Mouth/Throat: Mucous membranes are Dry Neck:  Painless ROM Cardiovascular: Tachycardia, regular rhythm. Grossly normal heart sounds.  Good peripheral circulation. Respiratory: Normal respiratory effort.  No retractions. Lungs CTAB. Gastrointestinal: Soft and nontender. No distention.  No CVA tenderness. Genitourinary: deferred Musculoskeletal: No lower extremity tenderness nor edema.  Warm and well perfused Neurologic:  Normal speech and language. No gross focal neurologic deficits are appreciated.  Skin:  Skin is warm, dry and intact. No rash noted. Psychiatric: Mood and affect are normal. Speech and behavior are normal.  ____________________________________________   LABS (all labs ordered are listed, but only abnormal results are displayed)  Labs Reviewed  COMPREHENSIVE METABOLIC PANEL - Abnormal; Notable for the following:       Result Value   Potassium 3.3 (*)    Glucose, Bld 52 (*)    Alkaline Phosphatase 134 (*)    GFR calc non Af Amer 55 (*)    All other components within normal limits  CBC  TROPONIN I  URINALYSIS COMPLETEWITH MICROSCOPIC ([facility] ONLY)   ____________________________________________  EKG  ED ECG REPORT I, Lavonia Drafts, the attending physician, personally viewed and interpreted this ECG.   Date: 07/09/2016   Rate: 110  Rhythm: sinus tachycardia  Axis: Normal  Intervals:none  ST&T Change: Nonspecific  ____________________________________________  RADIOLOGY  Chest x-ray unremarkable ____________________________________________   PROCEDURES  Procedure(s) performed:  No    Critical Care performed: No ____________________________________________   INITIAL IMPRESSION / ASSESSMENT AND PLAN / ED COURSE  Pertinent labs & imaging results that were available during my care of the patient were reviewed by me and considered in my medical decision making (see chart for details).  Patient well-appearing and in no distress. Her heart rate is mildly elevated upon arrival and her blood pressure is mildly elevated as well. Her exam is benign except she does have some dry mucous membranes which makes me suspect that she is mildly dehydrated. We will give IV fluids, check labs chest x-ray and reevaluate  Clinical Course  ----------------------------------------- 1:50 PM on 07/09/2016 -----------------------------------------  Heart rate 85 without intervention  ----------------------------------------- 2:59 PM on 07/09/2016 -----------------------------------------  Patient's workup is benign. On reexam she feels asymptomatic and very well. We discussed her lab work and agreed that outpatient follow-up was appropriate as needed for blood pressure management. Return precautions discussed ____________________________________________   FINAL CLINICAL IMPRESSION(S) / ED DIAGNOSES  Final diagnoses:  Elevated blood pressure      NEW MEDICATIONS STARTED DURING THIS VISIT:  New Prescriptions   No medications on file     Note:  This document was prepared using Dragon voice recognition software and may include unintentional dictation errors.    Lavonia Drafts, MD 07/09/16 1500

## 2016-07-09 NOTE — ED Notes (Signed)
Patient transported to X-ray 

## 2016-07-09 NOTE — ED Triage Notes (Signed)
Patient arrives to [facility] ed via POV from home with c/o hypertension and tachycardia. Patient regularly checks her HR/BP and noticed a change over the last two days with significant worsening today. Patient was concerned of elevation of HR>120 sustained and elevated BP sustained for 2+ days.   +ShOB +Weakness +Nausea

## 2016-09-28 ENCOUNTER — Other Ambulatory Visit: Payer: Self-pay | Admitting: Family Medicine

## 2016-09-28 DIAGNOSIS — Z1231 Encounter for screening mammogram for malignant neoplasm of breast: Secondary | ICD-10-CM

## 2016-11-16 ENCOUNTER — Ambulatory Visit
Admission: RE | Admit: 2016-11-16 | Discharge: 2016-11-16 | Disposition: A | Discharge status: Home / Self Care | Payer: Medicare Other | Source: Ambulatory Visit | Attending: Family Medicine | Admitting: Family Medicine

## 2016-11-16 DIAGNOSIS — Z1231 Encounter for screening mammogram for malignant neoplasm of breast: Secondary | ICD-10-CM

## 2017-02-20 ENCOUNTER — Ambulatory Visit: Payer: Medicare Other | Admitting: Radiation Oncology

## 2017-02-22 ENCOUNTER — Institutional Professional Consult (permissible substitution): Payer: Medicare Other | Admitting: Radiation Oncology

## 2017-02-27 ENCOUNTER — Ambulatory Visit: Payer: PRIVATE HEALTH INSURANCE | Admitting: Radiation Oncology

## 2017-02-28 ENCOUNTER — Emergency Department
Admission: EM | Admit: 2017-02-28 | Discharge: 2017-02-28 | Disposition: A | Discharge status: Other Acute Inpatient Hospital | Payer: Medicare Other | Attending: Emergency Medicine | Admitting: Emergency Medicine

## 2017-02-28 ENCOUNTER — Inpatient Hospital Stay (HOSPITAL_COMMUNITY)
Admission: AD | Admit: 2017-02-28 | Discharge: 2017-03-07 | DRG: 175 | Disposition: A | Discharge status: Home / Self Care | Payer: Medicare Other | Source: Other Acute Inpatient Hospital | Attending: Internal Medicine | Admitting: Internal Medicine

## 2017-02-28 ENCOUNTER — Inpatient Hospital Stay (HOSPITAL_COMMUNITY): Payer: Medicare Other

## 2017-02-28 ENCOUNTER — Emergency Department: Payer: Medicare Other

## 2017-02-28 DIAGNOSIS — I2510 Atherosclerotic heart disease of native coronary artery without angina pectoris: Secondary | ICD-10-CM | POA: Diagnosis present

## 2017-02-28 DIAGNOSIS — F419 Anxiety disorder, unspecified: Secondary | ICD-10-CM | POA: Diagnosis not present

## 2017-02-28 DIAGNOSIS — E039 Hypothyroidism, unspecified: Secondary | ICD-10-CM | POA: Diagnosis present

## 2017-02-28 DIAGNOSIS — I10 Essential (primary) hypertension: Secondary | ICD-10-CM | POA: Diagnosis present

## 2017-02-28 DIAGNOSIS — I272 Pulmonary hypertension, unspecified: Secondary | ICD-10-CM | POA: Diagnosis not present

## 2017-02-28 DIAGNOSIS — R0600 Dyspnea, unspecified: Secondary | ICD-10-CM | POA: Diagnosis present

## 2017-02-28 DIAGNOSIS — R739 Hyperglycemia, unspecified: Secondary | ICD-10-CM | POA: Diagnosis present

## 2017-02-28 DIAGNOSIS — Z7982 Long term (current) use of aspirin: Secondary | ICD-10-CM | POA: Insufficient documentation

## 2017-02-28 DIAGNOSIS — I959 Hypotension, unspecified: Secondary | ICD-10-CM | POA: Diagnosis present

## 2017-02-28 DIAGNOSIS — G4700 Insomnia, unspecified: Secondary | ICD-10-CM | POA: Diagnosis not present

## 2017-02-28 DIAGNOSIS — Z91040 Latex allergy status: Secondary | ICD-10-CM | POA: Diagnosis not present

## 2017-02-28 DIAGNOSIS — I2699 Other pulmonary embolism without acute cor pulmonale: Secondary | ICD-10-CM | POA: Diagnosis not present

## 2017-02-28 DIAGNOSIS — R0902 Hypoxemia: Secondary | ICD-10-CM | POA: Diagnosis not present

## 2017-02-28 DIAGNOSIS — I82491 Acute embolism and thrombosis of other specified deep vein of right lower extremity: Secondary | ICD-10-CM | POA: Diagnosis present

## 2017-02-28 DIAGNOSIS — I2609 Other pulmonary embolism with acute cor pulmonale: Secondary | ICD-10-CM | POA: Diagnosis present

## 2017-02-28 DIAGNOSIS — R0602 Shortness of breath: Secondary | ICD-10-CM | POA: Diagnosis present

## 2017-02-28 DIAGNOSIS — Z85820 Personal history of malignant melanoma of skin: Secondary | ICD-10-CM | POA: Insufficient documentation

## 2017-02-28 DIAGNOSIS — Z79899 Other long term (current) drug therapy: Secondary | ICD-10-CM | POA: Diagnosis not present

## 2017-02-28 DIAGNOSIS — K219 Gastro-esophageal reflux disease without esophagitis: Secondary | ICD-10-CM | POA: Diagnosis not present

## 2017-02-28 DIAGNOSIS — Z85828 Personal history of other malignant neoplasm of skin: Secondary | ICD-10-CM

## 2017-02-28 DIAGNOSIS — M79609 Pain in unspecified limb: Secondary | ICD-10-CM | POA: Diagnosis not present

## 2017-02-28 DIAGNOSIS — R0603 Acute respiratory distress: Secondary | ICD-10-CM | POA: Diagnosis not present

## 2017-02-28 DIAGNOSIS — I2602 Saddle embolus of pulmonary artery with acute cor pulmonale: Secondary | ICD-10-CM | POA: Diagnosis not present

## 2017-02-28 HISTORY — PX: IR GENERIC HISTORICAL: IMG1180011

## 2017-02-28 LAB — CBC
HEMATOCRIT: 41.2 % (ref 35.0–47.0)
HEMOGLOBIN: 14.2 g/dL (ref 12.0–16.0)
MCH: 32.2 pg (ref 26.0–34.0)
MCHC: 34.6 g/dL (ref 32.0–36.0)
MCV: 93 fL (ref 80.0–100.0)
Platelets: 193 10*3/uL (ref 150–440)
RBC: 4.43 MIL/uL (ref 3.80–5.20)
RDW: 14 % (ref 11.5–14.5)
WBC: 10.2 10*3/uL (ref 3.6–11.0)

## 2017-02-28 LAB — BASIC METABOLIC PANEL
ANION GAP: 8 (ref 5–15)
BUN: 29 mg/dL — ABNORMAL HIGH (ref 6–20)
CALCIUM: 8.8 mg/dL — AB (ref 8.9–10.3)
CO2: 21 mmol/L — ABNORMAL LOW (ref 22–32)
Chloride: 106 mmol/L (ref 101–111)
Creatinine, Ser: 0.99 mg/dL (ref 0.44–1.00)
GFR, EST AFRICAN AMERICAN: 59 mL/min — AB (ref >60)
GFR, EST NON AFRICAN AMERICAN: 51 mL/min — AB (ref >60)
Glucose, Bld: 187 mg/dL — ABNORMAL HIGH (ref 65–99)
Potassium: 4.3 mmol/L (ref 3.5–5.1)
Sodium: 135 mmol/L (ref 135–145)

## 2017-02-28 LAB — PROTIME-INR
INR: 1.11
Prothrombin Time: 14.3 seconds (ref 11.4–15.2)

## 2017-02-28 LAB — TROPONIN I: Troponin I: 0.06 ng/mL (ref <0.03)

## 2017-02-28 LAB — HEPARIN LEVEL (UNFRACTIONATED): HEPARIN UNFRACTIONATED: 0.99 [IU]/mL — AB (ref 0.30–0.70)

## 2017-02-28 LAB — APTT: aPTT: 31 seconds (ref 24–36)

## 2017-02-28 LAB — MRSA PCR SCREENING: MRSA by PCR: NEGATIVE

## 2017-02-28 LAB — BRAIN NATRIURETIC PEPTIDE: B Natriuretic Peptide: 1202 pg/mL — ABNORMAL HIGH (ref 0.0–100.0)

## 2017-02-28 MED ORDER — ZOLPIDEM TARTRATE 5 MG PO TABS
5.0000 mg | ORAL_TABLET | Freq: Every evening | ORAL | Status: DC | PRN
  Administered 2017-03-03 – 2017-03-06 (×2): 5 mg via ORAL
  Filled 2017-02-28 (×2): qty 1

## 2017-02-28 MED ORDER — SODIUM CHLORIDE 0.9 % IV SOLN: 250.0000 mL | INTRAVENOUS | Status: DC | PRN

## 2017-02-28 MED ORDER — METOCLOPRAMIDE HCL 5 MG PO TABS
5.0000 mg | ORAL_TABLET | Freq: Every day | ORAL | Status: DC
  Administered 2017-03-01 – 2017-03-07 (×7): 5 mg via ORAL
  Filled 2017-02-28 (×7): qty 1

## 2017-02-28 MED ORDER — PANTOPRAZOLE SODIUM 40 MG PO TBEC
40.0000 mg | DELAYED_RELEASE_TABLET | Freq: Every day | ORAL | Status: DC
  Administered 2017-03-01 – 2017-03-07 (×7): 40 mg via ORAL
  Filled 2017-02-28 (×7): qty 1

## 2017-02-28 MED ORDER — MIDAZOLAM HCL 2 MG/2ML IJ SOLN
INTRAMUSCULAR | Status: AC
  Filled 2017-02-28: qty 4

## 2017-02-28 MED ORDER — BISACODYL 5 MG PO TBEC
5.0000 mg | DELAYED_RELEASE_TABLET | Freq: Every day | ORAL | Status: DC | PRN
  Administered 2017-03-06: 5 mg via ORAL
  Filled 2017-02-28: qty 1

## 2017-02-28 MED ORDER — IOPAMIDOL (ISOVUE-300) INJECTION 61%
INTRAVENOUS | Status: AC
  Administered 2017-02-28: 25 mL
  Filled 2017-02-28: qty 100

## 2017-02-28 MED ORDER — LEVOTHYROXINE SODIUM 100 MCG IV SOLR: 25.0000 ug | Freq: Every day | INTRAVENOUS | Status: DC

## 2017-02-28 MED ORDER — FENTANYL CITRATE (PF) 100 MCG/2ML IJ SOLN
INTRAMUSCULAR | Status: AC
  Filled 2017-02-28: qty 2

## 2017-02-28 MED ORDER — DOCUSATE SODIUM 100 MG PO CAPS
100.0000 mg | ORAL_CAPSULE | Freq: Every day | ORAL | Status: DC
  Administered 2017-03-01 – 2017-03-07 (×5): 100 mg via ORAL
  Filled 2017-02-28 (×7): qty 1

## 2017-02-28 MED ORDER — ALPRAZOLAM 0.25 MG PO TABS
0.2500 mg | ORAL_TABLET | Freq: Every evening | ORAL | Status: DC | PRN
  Administered 2017-03-01: 0.25 mg via ORAL
  Filled 2017-02-28: qty 2
  Filled 2017-02-28: qty 1

## 2017-02-28 MED ORDER — SODIUM CHLORIDE 0.9 % IV BOLUS (SEPSIS)
500.0000 mL | Freq: Once | INTRAVENOUS | Status: AC
  Administered 2017-02-28: 500 mL via INTRAVENOUS

## 2017-02-28 MED ORDER — SODIUM CHLORIDE 0.9 % IV SOLN
12.0000 mg | Freq: Once | INTRAVENOUS | Status: AC
  Administered 2017-02-28: 12 mg via INTRAVENOUS
  Filled 2017-02-28: qty 12

## 2017-02-28 MED ORDER — GABAPENTIN 600 MG PO TABS
600.0000 mg | ORAL_TABLET | Freq: Three times a day (TID) | ORAL | Status: DC
  Administered 2017-03-01 – 2017-03-07 (×21): 600 mg via ORAL
  Filled 2017-02-28 (×23): qty 1

## 2017-02-28 MED ORDER — LORATADINE 10 MG PO TABS
10.0000 mg | ORAL_TABLET | Freq: Every day | ORAL | Status: DC
  Administered 2017-03-01 – 2017-03-07 (×7): 10 mg via ORAL
  Filled 2017-02-28 (×7): qty 1

## 2017-02-28 MED ORDER — LEVOTHYROXINE SODIUM 50 MCG PO TABS
50.0000 ug | ORAL_TABLET | Freq: Every day | ORAL | Status: DC
  Administered 2017-03-01 – 2017-03-07 (×7): 50 ug via ORAL
  Filled 2017-02-28 (×8): qty 1

## 2017-02-28 MED ORDER — IOPAMIDOL (ISOVUE-370) INJECTION 76%
75.0000 mL | Freq: Once | INTRAVENOUS | Status: AC | PRN
  Administered 2017-02-28: 75 mL via INTRAVENOUS

## 2017-02-28 MED ORDER — IOPAMIDOL (ISOVUE-300) INJECTION 61%
INTRAVENOUS | Status: AC
  Administered 2017-02-28: 1 mL
  Filled 2017-02-28: qty 100

## 2017-02-28 MED ORDER — HEPARIN (PORCINE) IN NACL 100-0.45 UNIT/ML-% IJ SOLN
1000.0000 [IU]/h | INTRAMUSCULAR | Status: DC
  Administered 2017-03-01: 950 [IU]/h via INTRAVENOUS
  Administered 2017-03-02 – 2017-03-06 (×5): 1000 [IU]/h via INTRAVENOUS
  Filled 2017-02-28 (×9): qty 250

## 2017-02-28 MED ORDER — HEPARIN (PORCINE) IN NACL 100-0.45 UNIT/ML-% IJ SOLN
1100.0000 [IU]/h | INTRAMUSCULAR | Status: DC
  Administered 2017-02-28: 1100 [IU]/h via INTRAVENOUS
  Filled 2017-02-28 (×3): qty 250

## 2017-02-28 MED ORDER — LIDOCAINE HCL (PF) 1 % IJ SOLN
INTRAMUSCULAR | Status: AC
  Filled 2017-02-28: qty 30

## 2017-02-28 MED ORDER — CYCLOBENZAPRINE HCL 10 MG PO TABS
5.0000 mg | ORAL_TABLET | Freq: Every day | ORAL | Status: DC
  Administered 2017-03-01 – 2017-03-06 (×6): 5 mg via ORAL
  Filled 2017-02-28 (×7): qty 1

## 2017-02-28 MED ORDER — DULOXETINE HCL 60 MG PO CPEP
60.0000 mg | ORAL_CAPSULE | Freq: Every day | ORAL | Status: DC
  Administered 2017-03-01 – 2017-03-06 (×6): 60 mg via ORAL
  Filled 2017-02-28 (×6): qty 1

## 2017-02-28 MED ORDER — FENTANYL CITRATE (PF) 100 MCG/2ML IJ SOLN
INTRAMUSCULAR | Status: AC | PRN
  Administered 2017-02-28: 25 ug via INTRAVENOUS

## 2017-02-28 MED ORDER — HEPARIN BOLUS VIA INFUSION
3350.0000 [IU] | Freq: Once | INTRAVENOUS | Status: AC
  Administered 2017-02-28: 3350 [IU] via INTRAVENOUS
  Filled 2017-02-28: qty 3350

## 2017-02-28 MED ORDER — PANTOPRAZOLE SODIUM 40 MG IV SOLR: 40.0000 mg | INTRAVENOUS | Status: DC

## 2017-02-28 MED ORDER — MIDAZOLAM HCL 2 MG/2ML IJ SOLN
INTRAMUSCULAR | Status: AC | PRN
  Administered 2017-02-28: 0.5 mg via INTRAVENOUS

## 2017-02-28 NOTE — Sedation Documentation (Signed)
R IF sheaths unremarkable, drsg CDI.

## 2017-02-28 NOTE — ED Provider Notes (Signed)
[facility] Emergency Department Provider Note  ____________________________________________  Time seen: Approximately 2:23 PM  I have reviewed the triage vital signs and the nursing notes.   HISTORY  Chief Complaint Weakness and Shortness of Breath   HPI Jacqueline Tate is a 83 y.o. female a history of hypertension, hypothyroidism, and skin cancer who presents for evaluation of shortness of breath. Patient reports4 days of shortness of breath worse with minimal exertion. Patient has also had sharp pleuritic moderate to severe pain located in her upper back between her shoulder blades for the same number of days. No cough or congestion, no chest pain. No leg pain or swelling. No prior history of PE. Yesterday she went to see her cardiologist who recommended an outpatient echo. Patient was feeling much worse today which prompted her visit to the emergency room. She denies personal or family history of blood clots.  Past Medical History:  Diagnosis Date  . Cancer ([place])    skin  . Hypertension   . Thyroid disease     Patient Active Problem List   Diagnosis Date Noted  . Pulmonary embolism ([place]) 02/28/2017    Past Surgical History:  Procedure Laterality Date  . ABDOMINAL HYSTERECTOMY    . BREAST BIOPSY Bilateral    years ago  . EYE SURGERY    . HERNIA REPAIR    . KNEE SURGERY      Prior to Admission medications   Medication Sig Start Date End Date Taking? Authorizing Provider  ALPRAZolam (XANAX) 0.25 MG tablet Take 0.25-0.5 mg by mouth at bedtime as needed for anxiety.   Yes Historical Provider, MD  amLODipine (NORVASC) 5 MG tablet Take 1 tablet by mouth daily. 06/15/16  Yes Historical Provider, MD  aspirin 81 MG tablet Take 81 mg by mouth daily.   Yes Historical Provider, MD  bisacodyl (DULCOLAX) 5 MG EC tablet Take 5 mg by mouth daily as needed for moderate constipation.   Yes Historical Provider, MD  cyclobenzaprine (FLEXERIL) 5 MG tablet Take 5  mg by mouth at bedtime.   Yes Historical Provider, MD  DULoxetine (CYMBALTA) 60 MG capsule Take 1 capsule by mouth daily. 05/27/16  Yes Historical Provider, MD  fluticasone (FLONASE) 50 MCG/ACT nasal spray Place 1 spray into both nostrils daily as needed. 06/17/16  Yes Historical Provider, MD  gabapentin (NEURONTIN) 600 MG tablet Take 1 tablet by mouth 3 (three) times daily. 05/27/16  Yes Historical Provider, MD  levothyroxine (SYNTHROID, LEVOTHROID) 50 MCG tablet Take 1 tablet by mouth daily. 05/27/16  Yes Historical Provider, MD  lisinopril (PRINIVIL,ZESTRIL) 20 MG tablet Take 20 mg by mouth daily.   Yes Historical Provider, MD  loratadine (CLARITIN) 10 MG tablet Take 10 mg by mouth daily.   Yes Historical Provider, MD  meloxicam (MOBIC) 7.5 MG tablet Take 1 tablet by mouth 2 (two) times daily. 05/27/16  Yes Historical Provider, MD  metoCLOPramide (REGLAN) 10 MG tablet Take 5 mg by mouth daily. 05/27/16  Yes Historical Provider, MD  metoprolol (LOPRESSOR) 50 MG tablet Take 1 tablet by mouth 2 (two) times daily. 05/27/16  Yes Historical Provider, MD  omeprazole (PRILOSEC) 20 MG capsule Take 20 mg by mouth 2 (two) times daily.    Yes Historical Provider, MD  Probiotic Product ([place]) Take 1 capsule by mouth daily.   Yes Historical Provider, MD  traZODone (DESYREL) 50 MG tablet Take 50 mg by mouth at bedtime.  04/19/16  Yes Historical Provider, MD  docusate  sodium (COLACE) 100 MG capsule Take 100 mg by mouth daily.    Historical Provider, MD  lisinopril-hydrochlorothiazide (PRINZIDE,ZESTORETIC) 20-12.5 MG tablet Take 1 tablet by mouth daily. 05/13/16   Historical Provider, MD    Allergies Codeine; Latex; Penicillins; and Sulfa antibiotics  Family History  Problem Relation Age of Onset  . Breast cancer Neg Hx     Social History Social History  Substance Use Topics  . Smoking status: Never Smoker  . Smokeless tobacco: Not on file  . Alcohol use Not on file    Review of  Systems  Constitutional: Negative for fever. Eyes: Negative for visual changes. ENT: Negative for sore throat. Neck: No neck pain  Cardiovascular: Negative for chest pain. Respiratory: + shortness of breath. Gastrointestinal: Negative for abdominal pain, vomiting or diarrhea. Genitourinary: Negative for dysuria. Musculoskeletal: + upper back pain. Skin: Negative for rash. Neurological: Negative for headaches, weakness or numbness. Psych: No SI or HI  ____________________________________________   PHYSICAL EXAM:  VITAL SIGNS: ED Triage Vitals  Enc Vitals Group     BP 02/28/17 1419 125/83     Pulse Rate 02/28/17 1419 (!) 107     Resp 02/28/17 1419 20     Temp 02/28/17 1419 97.7 F (36.5 C)     Temp Source 02/28/17 1419 Oral     SpO2 02/28/17 1419 96 %     Weight 02/28/17 1420 148 lb (67.1 kg)     Height --      Head Circumference --      Peak Flow --      Pain Score 02/28/17 1426 6     Pain Loc --      Pain Edu? --      Excl. in [place]? --     Constitutional: Alert and oriented, dyspneic.  HEENT:      Head: Normocephalic and atraumatic.         Eyes: Conjunctivae are normal. Sclera is non-icteric. EOMI. PERRL      Mouth/Throat: Mucous membranes are moist.       Neck: Supple with no signs of meningismus. Cardiovascular: Tachycardic with regular rhythm. No murmurs, gallops, or rubs. 2+ symmetrical distal pulses are present in all extremities. No JVD. Respiratory: Increased WOB, dyspneic, tachypenic, no hypoxia. Lungs are clear to auscultation bilaterally. No wheezes, crackles, or rhonchi.  Gastrointestinal: Soft, non tender, and non distended with positive bowel sounds. No rebound or guarding. Genitourinary: No CVA tenderness. Musculoskeletal: Nontender with normal range of motion in all extremities. No edema, cyanosis, or erythema of extremities. Neurologic: Normal speech and language. Face is symmetric. Moving all extremities. No gross focal neurologic deficits are  appreciated. Skin: Skin is warm, dry and intact. No rash noted. Psychiatric: Mood and affect are normal. Speech and behavior are normal.  ____________________________________________   LABS (all labs ordered are listed, but only abnormal results are displayed)  Labs Reviewed  BASIC METABOLIC PANEL - Abnormal; Notable for the following:       Result Value   CO2 21 (*)    Glucose, Bld 187 (*)    BUN 29 (*)    Calcium 8.8 (*)    GFR calc non Af Amer 51 (*)    GFR calc Af Amer 59 (*)    All other components within normal limits  TROPONIN I - Abnormal; Notable for the following:    Troponin I 0.06 (*)    All other components within normal limits  BRAIN NATRIURETIC PEPTIDE - Abnormal; Notable for the following:  B Natriuretic Peptide 1,202.0 (*)    All other components within normal limits  CBC  PROTIME-INR  APTT  URINALYSIS, COMPLETE (UACMP) WITH MICROSCOPIC  HEPARIN LEVEL (UNFRACTIONATED)  CBG MONITORING, ED   ____________________________________________  EKG  ED ECG REPORT I, Rudene Re, the attending physician, personally viewed and interpreted this ECG.  Sinus tachycardia, rate of 107,  incomplete right bundle branch block, right axis deviation, prolonged QTC, diffuse T-wave inversions in inferior, anterior and lateral leads which are unchanged from prior. ____________________________________________  RADIOLOGY  CTA chest: Positive for acute large bilateral pulmonary emboli with CT evidence of right heart strain (RV/LV Ratio = 1.6) consistent with at least submassive (intermediate risk) PE. The presence of right heart strain has been associated with an increased risk of morbidity and mortality. Please activate Code PE by paging [phone].  Aortic atherosclerosis.  Moderately large hiatal hernia.  Hazy parenchymal changes peripheral aspect right lower lobe possibly chronic or related to pulmonary embolus. Scarring/subsegmental atelectasis left lower  lobe/lingula. ____________________________________________   PROCEDURES  Procedure(s) performed: None Procedures Critical Care performed: yes  CRITICAL CARE Performed by: Rudene Re  ?  Total critical care time: 45 min  Critical care time was exclusive of separately billable procedures and treating other patients.  Critical care was necessary to treat or prevent imminent or life-threatening deterioration.  Critical care was time spent personally by me on the following activities: development of treatment plan with patient and/or surrogate as well as nursing, discussions with consultants, evaluation of patient's response to treatment, examination of patient, obtaining history from patient or surrogate, ordering and performing treatments and interventions, ordering and review of laboratory studies, ordering and review of radiographic studies, pulse oximetry and re-evaluation of patient's condition.  ____________________________________________   INITIAL IMPRESSION / ASSESSMENT AND PLAN / ED COURSE   83 y.o. female a history of hypertension, hypothyroidism, and skin cancer who presents for evaluation of shortness of breath and pleuritic upper back pain. Patient is very dyspneic, mildly tachypnea can tachycardic with mildly increased work of breathing and clear lungs concerning for pulmonary embolism versus pericardial effusion. CTA pending. EKG with no ischemic changes. Labs pending.  Clinical Course as of Feb 29 1912  Tue Feb 28, 2017  1531 CT showing bilateral submassive PE is with evidence of right heart strain. Troponin is elevated 0.06. Patient was started on fluids. Heparin has been ordered. Code PE has been activated.  [CV]  9629 Spoke with Dr. Alva Garnet, ICU MD at [facility] who recommended transferring patient for thrombolysis. IVF hanging. Pharmacy at the bedside for heparin initiation. Family updated. [facility] on their way to transport patient.  [CV]    Clinical Course User  Index [CV] Rudene Re, MD    Pertinent labs & imaging results that were available during my care of the patient were reviewed by me and considered in my medical decision making (see chart for details).    ____________________________________________   FINAL CLINICAL IMPRESSION(S) / ED DIAGNOSES  Final diagnoses:  Other acute pulmonary embolism with acute cor pulmonale (HCC)      NEW MEDICATIONS STARTED DURING THIS VISIT:  Discharge Medication List as of 02/28/2017  4:59 PM       Note:  This document was prepared using Dragon voice recognition software and may include unintentional dictation errors.    Rudene Re, MD 02/28/17 [phone]

## 2017-02-28 NOTE — Consult Note (Signed)
ANTICOAGULATION CONSULT NOTE - Initial Consult  Pharmacy Consult for heparin drip Indication: pulmonary embolus  Allergies  Allergen Reactions  . Codeine Nausea And Vomiting  . Latex   . Penicillins Rash    Has patient had a PCN reaction causing immediate rash, facial/tongue/throat swelling, SOB or lightheadedness with hypotension: Yes Has patient had a PCN reaction causing severe rash involving mucus membranes or skin necrosis: No Has patient had a PCN reaction that required hospitalization No Has patient had a PCN reaction occurring within the last 10 years: Yes If all of the above answers are "NO", then may proceed with Cephalosporin use.   . Sulfa Antibiotics Rash    Patient Measurements: Weight: 148 lb (67.1 kg) Heparin Dosing Weight: 67.1kg  Vital Signs: Temp: 97.7 F (36.5 C) (03/20 1419) Temp Source: Oral (03/20 1419) BP: 99/63 (03/20 1513) Pulse Rate: 84 (03/20 1513)  Labs:  Recent Labs  02/28/17 1415  HGB 14.2  HCT 41.2  PLT 193  CREATININE 0.99  TROPONINI 0.06*    CrCl cannot be calculated (Unknown ideal weight.).   Medical History: Past Medical History:  Diagnosis Date  . Cancer ([place])    skin  . Hypertension   . Thyroid disease     Medications:  Scheduled:  . heparin  3,350 Units Intravenous Once    Assessment: Pt is a 83 year old female who presents with SOB found to have a PE. Pharmacy consulted to dose heparin drip. CBC WNL. Baseline INR and APTT ordered as add on. Pt not on home anticoagulation.  Height per care everywhere =64 in  Goal of Therapy:  Heparin level 0.3-0.7 units/ml Monitor platelets by anticoagulation protocol: Yes   Plan:  Give 3350 units bolus x 1 Start heparin infusion at 1100 units/hr Check anti-Xa level in 8 hours and daily while on heparin Continue to monitor H&H and platelets  Melissa D Maccia, Pharm.D, BCPS Clinical Pharmacist  02/28/2017,3:39 PM

## 2017-02-28 NOTE — Sedation Documentation (Signed)
6Fr. sheat X2 to R IJ. Sutured, gauze/tegaderm bandage, CDI. NS syringe for flush, will connect to IVF upon arrival to unit.

## 2017-02-28 NOTE — ED Notes (Signed)
Pt taken to CT via stretcher.

## 2017-02-28 NOTE — Sedation Documentation (Signed)
Patient denies pain and is resting comfortably.  

## 2017-02-28 NOTE — ED Notes (Signed)
Called pharmacy to get heparin sent to ED

## 2017-02-28 NOTE — Procedures (Signed)
  Pre-operative Diagnosis: Bilateral PE with right heart strain       Post-operative Diagnosis:  Bilateral PE with right heart strain   Indications: Submassive PE with right heart strain  Procedure: Pulmonary arteriogram with placement of bilateral EKOS PA catheters for thrombolytic infusion.  PA pressures.  Findings: Elevated PA pressures:  RT PA: 51/25 (35 mean);  LT PA: 48/21 (mean 33)  Bilateral catheters directed into lower lobe pulmonary arteries  Complications: None     EBL: Minimal  Plan: Start 12 hour ultrasound-assisted thrombolysis with TPA.  Total of 2 mg TPA per hour.

## 2017-02-28 NOTE — Consult Note (Signed)
Chief Complaint: Patient was seen in consultation today for submassive pulmonary emboli at the request of Critical Care  Referring Physician(s): Merton Border   Patient Status: [facility] - In-pt  History of Present Illness: Jacqueline Tate is a 83 y.o. female with progressive shortness of breath and fatigue over the past weeks.  Patient was recently seen by Cardiologist and reportedly she was hypotensive and an echocardiogram was ordered but not yet performed.  Shortness of breath progressed and patient went to [facility] ED today.  Chest CTA demonstrated large bilateral emboli with evidence for significant right heart strain.  Patient was started on heparin and transferred to [facility] for potential catheter-directed thrombolysis with EKOS.  Patient is still complaining of shortness of breath with minimal exertion but she is resting comfortable when she is immobile in bed.  Patient has history of skin cancer but no other malignancy.  She has been complaining of dizziness but no significant falls or injuries in past few months.  She had a knot behind the left knee and says that it has resolved.  Past surgical history is significant for previous left knee surgery years ago and prior hiatal hernia surgery.  Past Medical History:  Diagnosis Date  . Cancer ([place])    skin  . Hypertension   . Thyroid disease     Past Surgical History:  Procedure Laterality Date  . ABDOMINAL HYSTERECTOMY    . BREAST BIOPSY Bilateral    years ago  . EYE SURGERY    . HERNIA REPAIR    . KNEE SURGERY      Allergies: Codeine; Latex; Penicillins; and Sulfa antibiotics  Medications: Prior to Admission medications   Medication Sig Start Date End Date Taking? Authorizing Provider  ALPRAZolam (XANAX) 0.25 MG tablet Take 0.25-0.5 mg by mouth at bedtime as needed for anxiety.    Historical Provider, MD  amLODipine (NORVASC) 5 MG tablet Take 1 tablet by mouth daily. 06/15/16   Historical Provider, MD  aspirin 81 MG  tablet Take 81 mg by mouth daily.    Historical Provider, MD  bisacodyl (DULCOLAX) 5 MG EC tablet Take 5 mg by mouth daily as needed for moderate constipation.    Historical Provider, MD  cyclobenzaprine (FLEXERIL) 5 MG tablet Take 5 mg by mouth at bedtime.    Historical Provider, MD  docusate sodium (COLACE) 100 MG capsule Take 100 mg by mouth daily.    Historical Provider, MD  DULoxetine (CYMBALTA) 60 MG capsule Take 1 capsule by mouth daily. 05/27/16   Historical Provider, MD  fluticasone (FLONASE) 50 MCG/ACT nasal spray Place 1 spray into both nostrils daily as needed. 06/17/16   Historical Provider, MD  gabapentin (NEURONTIN) 600 MG tablet Take 1 tablet by mouth 3 (three) times daily. 05/27/16   Historical Provider, MD  levothyroxine (SYNTHROID, LEVOTHROID) 50 MCG tablet Take 1 tablet by mouth daily. 05/27/16   Historical Provider, MD  lisinopril (PRINIVIL,ZESTRIL) 20 MG tablet Take 20 mg by mouth daily.    Historical Provider, MD  lisinopril-hydrochlorothiazide (PRINZIDE,ZESTORETIC) 20-12.5 MG tablet Take 1 tablet by mouth daily. 05/13/16   Historical Provider, MD  loratadine (CLARITIN) 10 MG tablet Take 10 mg by mouth daily.    Historical Provider, MD  meloxicam (MOBIC) 7.5 MG tablet Take 1 tablet by mouth 2 (two) times daily. 05/27/16   Historical Provider, MD  metoCLOPramide (REGLAN) 10 MG tablet Take 5 mg by mouth daily. 05/27/16   Historical Provider, MD  metoprolol (LOPRESSOR) 50 MG tablet Take  1 tablet by mouth 2 (two) times daily. 05/27/16   Historical Provider, MD  omeprazole (PRILOSEC) 20 MG capsule Take 20 mg by mouth 2 (two) times daily.     Historical Provider, MD  Probiotic Product ([place]) Take 1 capsule by mouth daily.    Historical Provider, MD  traZODone (DESYREL) 50 MG tablet Take 50 mg by mouth at bedtime.  04/19/16   Historical Provider, MD     Family History  Problem Relation Age of Onset  . Breast cancer Neg Hx     Social History   Social History  .  Marital status: Married    Spouse name: N/A  . Number of children: N/A  . Years of education: N/A   Social History Main Topics  . Smoking status: Never Smoker  . Smokeless tobacco: Not on file  . Alcohol use Not on file  . Drug use: Unknown  . Sexual activity: Not on file   Other Topics Concern  . Not on file   Social History Narrative  . No narrative on file     Review of Systems  Constitutional: Positive for activity change and fatigue.  Respiratory: Positive for shortness of breath.   Cardiovascular: Negative for leg swelling.    Vital Signs: BP 114/77 (BP Location: Right Arm)   Pulse 98   Temp 97.8 F (36.6 C) (Oral)   Resp (!) 21   Ht 5\' 5"  (1.651 m)   Wt 156 lb 8.4 oz (71 kg)   SpO2 97%   BMI 26.05 kg/m   Physical Exam  Constitutional: She is oriented to person, place, and time.  Cardiovascular: Regular rhythm and normal heart sounds.   Tachycardiac  Pulmonary/Chest: Effort normal and breath sounds normal.  Abdominal: Soft. Bowel sounds are normal. She exhibits no distension. There is no tenderness.  Neurological: She is alert and oriented to person, place, and time.    Mallampati Score: 2     Imaging: Ct Angio Chest Pe W And/or Wo Contrast  Result Date: 02/28/2017 CLINICAL DATA:  83 year old female with shortness of breath. Initial encounter. EXAM: CT ANGIOGRAPHY CHEST WITH CONTRAST TECHNIQUE: Multidetector CT imaging of the chest was performed using the standard protocol during bolus administration of intravenous contrast. Multiplanar CT image reconstructions and MIPs were obtained to evaluate the vascular anatomy. CONTRAST:  75 cc Isovue 370. COMPARISON:  None. FINDINGS: Cardiovascular: Large bilateral pulmonary emboli with evidence of right heart strain. Coronary artery calcifications. Atherosclerotic changes thoracic aorta. Exam not optimized to evaluate the aorta although no obvious dissection. Mediastinum/Nodes: Moderately large hiatal hernia.  Lungs/Pleura: Hazy parenchymal changes peripheral aspect right lower lobe possibly chronic or related to pulmonary embolus. Scarring/subsegmental atelectasis left lower lobe/lingula. Upper Abdomen: Prominent contrast hepatic veins may be related to increased right heart pressure. Musculoskeletal: Degenerative changes lower thoracic/ upper lumbar spine and lower cervical spine. Review of the MIP images confirms the above findings. IMPRESSION: Positive for acute large bilateral pulmonary emboli with CT evidence of right heart strain (RV/LV Ratio = 1.6) consistent with at least submassive (intermediate risk) PE. The presence of right heart strain has been associated with an increased risk of morbidity and mortality. Please activate Code PE by paging [phone]. Aortic atherosclerosis. Moderately large hiatal hernia. Hazy parenchymal changes peripheral aspect right lower lobe possibly chronic or related to pulmonary embolus. Scarring/subsegmental atelectasis left lower lobe/lingula. These results were called by telephone at the time of interpretation on 02/28/2017 at 3:14 pm to Dr. Rudene Re ,  who verbally acknowledged these results. Electronically Signed   By: Genia Del M.D.   On: 02/28/2017 15:24    Labs:  CBC:  Recent Labs  07/09/16 1304 02/28/17 1415  WBC 8.7 10.2  HGB 14.7 14.2  HCT 41.5 41.2  PLT 197 193    COAGS:  Recent Labs  02/28/17 1421  INR 1.11  APTT 31    BMP:  Recent Labs  07/09/16 1304 02/28/17 1415  NA 137 135  K 3.3* 4.3  CL 102 106  CO2 28 21*  GLUCOSE 52* 187*  BUN 20 29*  CALCIUM 9.5 8.8*  CREATININE 0.93 0.99  GFRNONAA 55* 51*  GFRAA >60 59*    LIVER FUNCTION TESTS:  Recent Labs  07/09/16 1304  BILITOT 0.3  AST 28  ALT 23  ALKPHOS 134*  PROT 7.8  ALBUMIN 4.3    TUMOR MARKERS: No results for input(s): AFPTM, CEA, CA199, CHROMGRNA in the last 8760 hours.  Assessment and Plan:  83 yo with large bilateral pulmonary emboli and  right heart strain.  Findings compatible with a submassive PE.  Patient would be a candidate for ultrasound-assistant PE thrombolysis.  She has no contraindications for thrombolytic therapy.  Discussed the risks and benefits of catheter-directed thrombolytic therapy with the patient and family.  The risk of bleeding at the catheter insertion sites and elsewhere were discussed with patient and family.  After a long discussion with patient and family, we decided to proceed with the catheter-directed thrombolytic therapy.  Will place the EKOS catheters this evening and plan for 12 hour bilateral treatment.  Thank you for this interesting consult.  I greatly enjoyed meeting Jacqueline Tate and look forward to participating in their care.  A copy of this report was sent to the requesting provider on this date.  Electronically Signed: Carylon Perches 02/28/2017, 7:25 PM   I spent a total of 20 Minutes    in face to face in clinical consultation, greater than 50% of which was counseling/coordinating care for submassive pulmonary emboli.

## 2017-02-28 NOTE — H&P (Signed)
Name: Jacqueline Tate MRN: [id number] DOB: [birth year]    ADMISSION DATE:  02/28/2017 CONSULTATION DATE:  02/28/2017  REFERRING MD :  Dr. Governor Specking  CHIEF COMPLAINT:  SOB  HISTORY OF PRESENT ILLNESS:  83 year old female with PMH as below, which is significant for skin Ca, HTN, and hypothyroidism. Family reports that she has experienced malaise and fatigue for about the past month and also noted a "knot" in the back of her left knee. Not a visible knot but more like a cramp. She initially complained of SOB dating back to 3/16 while gardening. She presented to her cardiologist 3/19 with complaints of chest pain, which was felt to be atypical. She was also complaining of dizziness and her BP was noted to be somewhat low and her amlodipine was reduced. 3/20 she presented to [facility] ED with worsening dyspnea and upper back pain. In the ED she was dyspneic and tachycardic with clear CXR so she was sent for CT angiogram of the chest, which demonstrated bilateral PE with evidence of R heart strain. She was transferred to [facility] for further evaluation and consideration of catheter directed lysis.  SIGNIFICANT EVENTS  3/20 admit   STUDIES:  CT angiogram 3/20 > acute large bilateral pulmonary emboli with CT evidence of right heart strain (RV/LV Ratio = 1.6). Moderate hiatal hernia. Hazy parenchymal changes to RLL.    PAST MEDICAL HISTORY :   has a past medical history of Cancer ([place]); Hypertension; and Thyroid disease.  has a past surgical history that includes Abdominal hysterectomy; Eye surgery; Hernia repair; Knee surgery; and Breast biopsy (Bilateral). Prior to Admission medications   Medication Sig Start Date End Date Taking? Authorizing Provider  ALPRAZolam (XANAX) 0.25 MG tablet Take 0.25-0.5 mg by mouth at bedtime as needed for anxiety.    Historical Provider, MD  amLODipine (NORVASC) 5 MG tablet Take 1 tablet by mouth daily. 06/15/16   Historical Provider, MD  aspirin 81 MG tablet Take 81  mg by mouth daily.    Historical Provider, MD  bisacodyl (DULCOLAX) 5 MG EC tablet Take 5 mg by mouth daily as needed for moderate constipation.    Historical Provider, MD  cyclobenzaprine (FLEXERIL) 5 MG tablet Take 5 mg by mouth at bedtime.    Historical Provider, MD  docusate sodium (COLACE) 100 MG capsule Take 100 mg by mouth daily.    Historical Provider, MD  DULoxetine (CYMBALTA) 60 MG capsule Take 1 capsule by mouth daily. 05/27/16   Historical Provider, MD  fluticasone (FLONASE) 50 MCG/ACT nasal spray Place 1 spray into both nostrils daily as needed. 06/17/16   Historical Provider, MD  gabapentin (NEURONTIN) 600 MG tablet Take 1 tablet by mouth 3 (three) times daily. 05/27/16   Historical Provider, MD  levothyroxine (SYNTHROID, LEVOTHROID) 50 MCG tablet Take 1 tablet by mouth daily. 05/27/16   Historical Provider, MD  lisinopril (PRINIVIL,ZESTRIL) 20 MG tablet Take 20 mg by mouth daily.    Historical Provider, MD  lisinopril-hydrochlorothiazide (PRINZIDE,ZESTORETIC) 20-12.5 MG tablet Take 1 tablet by mouth daily. 05/13/16   Historical Provider, MD  loratadine (CLARITIN) 10 MG tablet Take 10 mg by mouth daily.    Historical Provider, MD  meloxicam (MOBIC) 7.5 MG tablet Take 1 tablet by mouth 2 (two) times daily. 05/27/16   Historical Provider, MD  metoCLOPramide (REGLAN) 10 MG tablet Take 5 mg by mouth daily. 05/27/16   Historical Provider, MD  metoprolol (LOPRESSOR) 50 MG tablet Take 1 tablet by mouth 2 (two) times  daily. 05/27/16   Historical Provider, MD  omeprazole (PRILOSEC) 20 MG capsule Take 20 mg by mouth 2 (two) times daily.     Historical Provider, MD  Probiotic Product ([place]) Take 1 capsule by mouth daily.    Historical Provider, MD  traZODone (DESYREL) 50 MG tablet Take 50 mg by mouth at bedtime.  04/19/16   Historical Provider, MD   Allergies  Allergen Reactions  . Codeine Nausea And Vomiting  . Latex   . Penicillins Rash    Has patient had a PCN reaction causing  immediate rash, facial/tongue/throat swelling, SOB or lightheadedness with hypotension: Yes Has patient had a PCN reaction causing severe rash involving mucus membranes or skin necrosis: No Has patient had a PCN reaction that required hospitalization No Has patient had a PCN reaction occurring within the last 10 years: Yes If all of the above answers are "NO", then may proceed with Cephalosporin use.   . Sulfa Antibiotics Rash    FAMILY HISTORY:  family history is not on file. SOCIAL HISTORY:  reports that she has never smoked. She does not have any smokeless tobacco history on file.  REVIEW OF SYSTEMS:   Constitutional: weight loss, gain, night sweats, Fevers, chills, fatigue .  HEENT: headaches, Sore throat, sneezing, nasal congestion, post nasal drip, Difficulty swallowing, Tooth/dental problems, visual complaints visual changes, ear ache CV:  chest pain, radiates: back,Orthopnea, PND, swelling in lower extremities, dizziness, palpitations, syncope.  GI  heartburn, indigestion, abdominal pain, nausea, vomiting, diarrhea, change in bowel habits, loss of appetite, bloody stools.  Resp: cough, productive: , hemoptysis, dyspnea, chest pain, pleuritic.  Skin: rash or itching or icterus GU: dysuria, change in color of urine, urgency or frequency. flank pain, hematuria  MS: joint pain or swelling. decreased range of motion  Psych: change in mood or affect. depression or anxiety.  Neuro: difficulty with speech, weakness, numbness, ataxia, dizziness   SUBJECTIVE:  States she feels short of breath and dizzy. IR MD at beside.   VITAL SIGNS: Temp:  [97.7 F (36.5 C)] 97.7 F (36.5 C) (03/20 1419) Pulse Rate:  [84-107] 91 (03/20 1630) Resp:  [17-24] 19 (03/20 1630) BP: (99-125)/(63-90) 115/87 (03/20 1630) SpO2:  [90 %-99 %] 99 % (03/20 1630) Weight:  [67.1 kg (148 lb)-71 kg (156 lb 8.4 oz)] 71 kg (156 lb 8.4 oz) (03/20 1741)  PHYSICAL EXAMINATION: General:  Elderly female of normal  body habitus in mild resp distress on nasal cannula.  Neuro:  Alert, oriented, non-focal HEENT: [place]/AT, PERRL, no JVD Cardiovascular:  Borderline tachy, regular, no MRG Lungs:  Clear. Easily winded with conversation Abdomen:  Soft, non-tender, non-distended Musculoskeletal:  No acute deformity or ROM limitation Skin:  Grossly intact   Recent Labs Lab 02/28/17 1415  NA 135  K 4.3  CL 106  CO2 21*  BUN 29*  CREATININE 0.99  GLUCOSE 187*    Recent Labs Lab 02/28/17 1415  HGB 14.2  HCT 41.2  WBC 10.2  PLT 193   Ct Angio Chest Pe W And/or Wo Contrast  Result Date: 02/28/2017 CLINICAL DATA:  83 year old female with shortness of breath. Initial encounter. EXAM: CT ANGIOGRAPHY CHEST WITH CONTRAST TECHNIQUE: Multidetector CT imaging of the chest was performed using the standard protocol during bolus administration of intravenous contrast. Multiplanar CT image reconstructions and MIPs were obtained to evaluate the vascular anatomy. CONTRAST:  75 cc Isovue 370. COMPARISON:  None. FINDINGS: Cardiovascular: Large bilateral pulmonary emboli with evidence of right heart  strain. Coronary artery calcifications. Atherosclerotic changes thoracic aorta. Exam not optimized to evaluate the aorta although no obvious dissection. Mediastinum/Nodes: Moderately large hiatal hernia. Lungs/Pleura: Hazy parenchymal changes peripheral aspect right lower lobe possibly chronic or related to pulmonary embolus. Scarring/subsegmental atelectasis left lower lobe/lingula. Upper Abdomen: Prominent contrast hepatic veins may be related to increased right heart pressure. Musculoskeletal: Degenerative changes lower thoracic/ upper lumbar spine and lower cervical spine. Review of the MIP images confirms the above findings. IMPRESSION: Positive for acute large bilateral pulmonary emboli with CT evidence of right heart strain (RV/LV Ratio = 1.6) consistent with at least submassive (intermediate risk) PE. The presence of right  heart strain has been associated with an increased risk of morbidity and mortality. Please activate Code PE by paging [phone]. Aortic atherosclerosis. Moderately large hiatal hernia. Hazy parenchymal changes peripheral aspect right lower lobe possibly chronic or related to pulmonary embolus. Scarring/subsegmental atelectasis left lower lobe/lingula. These results were called by telephone at the time of interpretation on 02/28/2017 at 3:14 pm to Dr. Rudene Re , who verbally acknowledged these results. Electronically Signed   By: Genia Del M.D.   On: 02/28/2017 15:24    ASSESSMENT / PLAN:  Acute Hypoxic Respiratory Distress Secondary to Bilateral Pulmonary Emboli with evidence of right heart strain  Plan -Maintain Oxygenation >92 -Lower Extremity Dopplers Pending  -Heparin gtt per Pharmacy  -IR Consulted - will plan for EKOS   Tachycardia  Elevated Troponin (Suspected Demand ischemia) Elevated BNP H/O HTN Plan -Cardiac Monitoring  -Trend Troponin  -Echo Pending  -Hold home Norvasc, ASA, Lisinopril, Metoprolol in setting of hypotension   Hypothyroidism  Plan -Continue home synthroid   Hyperglycemia  Plan -Trend Glucose  H/O Diverticulitis, GERD Plan -NPO -PPI   H/O Anxiety  Plan -Hold home xanax, flexeril, Cymbalta, Neurontin, trazodone    CC Time: 40 minutes   Pulmonary and [place] Pager: [phone]  02/28/2017, 5:59 PM

## 2017-02-28 NOTE — Sedation Documentation (Signed)
RA pressure 51/25 (35)

## 2017-02-28 NOTE — ED Notes (Signed)
Pt placed on 2L nasal cannula for comfort.

## 2017-02-28 NOTE — Sedation Documentation (Signed)
PAP 47/20 (33)

## 2017-02-28 NOTE — ED Triage Notes (Signed)
Pt presents to ED via [facility] from home c/o of weakness that has progressively become worse over last week and SOB when talking or moving. Pt denies hx of CHF or COPD. Has chronic back pain and gets injections for that. Pt saw Dr. Ubaldo Glassing yesterday, normal 12 lead. EMS states sinus tach on their 12 lead. Pt was hardly able to walk 10 feet for EMS without almost giving out on them.   CBG198, 123/83, 95% RA, 110-120 HR. With EMS   Some nausea, SOB with talking, movement, weakness, and appears pale.

## 2017-03-01 ENCOUNTER — Inpatient Hospital Stay (HOSPITAL_COMMUNITY): Payer: Medicare Other

## 2017-03-01 ENCOUNTER — Encounter (HOSPITAL_COMMUNITY): Payer: Self-pay | Admitting: Diagnostic Radiology

## 2017-03-01 DIAGNOSIS — I2699 Other pulmonary embolism without acute cor pulmonale: Secondary | ICD-10-CM

## 2017-03-01 DIAGNOSIS — M79609 Pain in unspecified limb: Secondary | ICD-10-CM

## 2017-03-01 DIAGNOSIS — I2602 Saddle embolus of pulmonary artery with acute cor pulmonale: Secondary | ICD-10-CM

## 2017-03-01 HISTORY — PX: IR GENERIC HISTORICAL: IMG1180011

## 2017-03-01 LAB — CBC
HCT: 37.4 % (ref 36.0–46.0)
HCT: 37.6 % (ref 36.0–46.0)
Hemoglobin: 12.8 g/dL (ref 12.0–15.0)
Hemoglobin: 12.8 g/dL (ref 12.0–15.0)
MCH: 31.5 pg (ref 26.0–34.0)
MCH: 31.2 pg (ref 26.0–34.0)
MCHC: 34.2 g/dL (ref 30.0–36.0)
MCHC: 34 g/dL (ref 30.0–36.0)
MCV: 92.1 fL (ref 78.0–100.0)
MCV: 91.7 fL (ref 78.0–100.0)
Platelets: 141 10*3/uL — ABNORMAL LOW (ref 150–400)
Platelets: 150 10*3/uL (ref 150–400)
RBC: 4.06 MIL/uL (ref 3.87–5.11)
RBC: 4.1 MIL/uL (ref 3.87–5.11)
RDW: 14.3 % (ref 11.5–15.5)
RDW: 14 % (ref 11.5–15.5)
WBC: 6.3 10*3/uL (ref 4.0–10.5)
WBC: 6.9 10*3/uL (ref 4.0–10.5)

## 2017-03-01 LAB — ECHOCARDIOGRAM COMPLETE
AVLVOTPG: 6 mmHg
Ao-asc: 33 cm
CHL CUP DOP CALC LVOT VTI: 18.6 cm
CHL CUP MV DEC (S): 232
CHL CUP TV REG PEAK VELOCITY: 284 cm/s
EWDT: 232 ms
FS: 24 % — AB (ref 28–44)
Height: 65 in
IVS/LV PW RATIO, ED: 0.88
LA ID, A-P, ES: 31 mm
LADIAMINDEX: 1.71 cm/m2
LAVOLA4C: 27.1 mL
LEFT ATRIUM END SYS DIAM: 31 mm
LV PW d: 10.5 mm — AB (ref 0.6–1.1)
LVOT SV: 58 mL
LVOT area: 3.14 cm2
LVOT peak vel: 124 cm/s
LVOTD: 20 mm
MV pk E vel: 68.6 m/s
MVPKAVEL: 102 m/s
RV TAPSE: 17.1 mm
TR max vel: 284 cm/s
WEIGHTICAEL: 2504.43 [oz_av]

## 2017-03-01 LAB — GLUCOSE, CAPILLARY
GLUCOSE-CAPILLARY: 116 mg/dL — AB (ref 65–99)
GLUCOSE-CAPILLARY: 155 mg/dL — AB (ref 65–99)
GLUCOSE-CAPILLARY: 97 mg/dL (ref 65–99)
Glucose-Capillary: 118 mg/dL — ABNORMAL HIGH (ref 65–99)

## 2017-03-01 LAB — BASIC METABOLIC PANEL
Anion gap: 12 (ref 5–15)
BUN: 20 mg/dL (ref 6–20)
CHLORIDE: 108 mmol/L (ref 101–111)
CO2: 19 mmol/L — AB (ref 22–32)
CREATININE: 0.75 mg/dL (ref 0.44–1.00)
Calcium: 8.4 mg/dL — ABNORMAL LOW (ref 8.9–10.3)
GFR calc non Af Amer: >60 mL/min (ref >60)
Glucose, Bld: 124 mg/dL — ABNORMAL HIGH (ref 65–99)
POTASSIUM: 3.4 mmol/L — AB (ref 3.5–5.1)
SODIUM: 139 mmol/L (ref 135–145)

## 2017-03-01 LAB — HEPARIN LEVEL (UNFRACTIONATED)
HEPARIN UNFRACTIONATED: 0.69 [IU]/mL (ref 0.30–0.70)
Heparin Unfractionated: 0.4 IU/mL (ref 0.30–0.70)
Heparin Unfractionated: 0.4 IU/mL (ref 0.30–0.70)
Heparin Unfractionated: 0.49 IU/mL (ref 0.30–0.70)
Heparin Unfractionated: 0.53 IU/mL (ref 0.30–0.70)

## 2017-03-01 LAB — MAGNESIUM: MAGNESIUM: 1.9 mg/dL (ref 1.7–2.4)

## 2017-03-01 LAB — FIBRINOGEN
Fibrinogen: 383 mg/dL (ref 210–475)
Fibrinogen: 427 mg/dL (ref 210–475)

## 2017-03-01 LAB — TROPONIN I
TROPONIN I: 0.15 ng/mL — AB (ref <0.03)
Troponin I: 0.15 ng/mL (ref <0.03)
Troponin I: 0.09 ng/mL (ref <0.03)

## 2017-03-01 LAB — PHOSPHORUS: PHOSPHORUS: 3.3 mg/dL (ref 2.5–4.6)

## 2017-03-01 MED ORDER — SODIUM CHLORIDE 0.9 % IV SOLN: INTRAVENOUS | Status: DC

## 2017-03-01 MED ORDER — SODIUM CHLORIDE 0.9% FLUSH
3.0000 mL | Freq: Two times a day (BID) | INTRAVENOUS | Status: DC
  Administered 2017-03-01 – 2017-03-07 (×2): 3 mL via INTRAVENOUS

## 2017-03-01 MED ORDER — PERFLUTREN LIPID MICROSPHERE
1.0000 mL | INTRAVENOUS | Status: AC | PRN
  Administered 2017-03-01: 2 mL via INTRAVENOUS
  Filled 2017-03-01: qty 10

## 2017-03-01 MED ORDER — ONDANSETRON HCL 4 MG/2ML IJ SOLN
4.0000 mg | Freq: Four times a day (QID) | INTRAMUSCULAR | Status: DC | PRN
  Administered 2017-03-01 – 2017-03-05 (×2): 4 mg via INTRAVENOUS
  Filled 2017-03-01 (×2): qty 2

## 2017-03-01 MED ORDER — SODIUM CHLORIDE 0.9 % IV SOLN: 250.0000 mL | INTRAVENOUS | Status: DC | PRN

## 2017-03-01 MED ORDER — ACETAMINOPHEN 325 MG PO TABS
650.0000 mg | ORAL_TABLET | Freq: Four times a day (QID) | ORAL | Status: DC | PRN
  Administered 2017-03-01 – 2017-03-06 (×7): 650 mg via ORAL
  Filled 2017-03-01 (×7): qty 2

## 2017-03-01 MED ORDER — POTASSIUM CHLORIDE CRYS ER 20 MEQ PO TBCR
40.0000 meq | EXTENDED_RELEASE_TABLET | Freq: Once | ORAL | Status: AC
  Administered 2017-03-01: 40 meq via ORAL
  Filled 2017-03-01: qty 2

## 2017-03-01 MED ORDER — SODIUM CHLORIDE 0.9% FLUSH: 3.0000 mL | INTRAVENOUS | Status: DC | PRN

## 2017-03-01 NOTE — Consult Note (Signed)
[facility] for heparin Indication: pulmonary embolus  Allergies  Allergen Reactions  . Codeine Nausea And Vomiting  . Latex   . Penicillins Rash    Has patient had a PCN reaction causing immediate rash, facial/tongue/throat swelling, SOB or lightheadedness with hypotension: Yes Has patient had a PCN reaction causing severe rash involving mucus membranes or skin necrosis: No Has patient had a PCN reaction that required hospitalization No Has patient had a PCN reaction occurring within the last 10 years: Yes If all of the above answers are "NO", then may proceed with Cephalosporin use.   . Sulfa Antibiotics Rash    Patient Measurements: Height: 5\' 5"  (165.1 cm) Weight: 156 lb 8.4 oz (71 kg) IBW/kg (Calculated) : 57 Heparin Dosing Weight: 67.1kg  Vital Signs: Temp: 98.1 F (36.7 C) (03/21 0010) Temp Source: Oral (03/21 0010) BP: 120/90 (03/20 2115) Pulse Rate: 106 (03/20 2115)  Labs:  Recent Labs  02/28/17 1415 02/28/17 1421 02/28/17 2340  HGB 14.2  --   --   HCT 41.2  --   --   PLT 193  --   --   APTT  --  31  --   LABPROT  --  14.3  --   INR  --  1.11  --   HEPARINUNFRC  --   --  0.99*  CREATININE 0.99  --   --   TROPONINI 0.06*  --   --     Estimated Creatinine Clearance: 42.6 mL/min (by C-G formula based on SCr of 0.99 mg/dL).  Assessment: 83 y.o. female with PE receiving catheter-directed thrombolysis  for heparin  Goal of Therapy:  Heparin level 0.3-0.7 units/ml Monitor platelets by anticoagulation protocol: Yes   Plan:  Decrease Heparin 950 units/hr Follow-up am labs.   Phillis Knack, PharmD, BCPS  03/01/2017,12:11 AM

## 2017-03-01 NOTE — Progress Notes (Signed)
*  PRELIMINARY RESULTS* Vascular Ultrasound Bilateral lower extremity venous duplex has been completed.  Preliminary findings: Small segment of acute deep vein thrombosis in the right distal gastrocnemius veins.  Other visualized veins of the lower extremities appear negative for thrombosis. negative for bakers cysts bilaterally.  Jacqueline Tate 03/01/2017, 10:30 AM

## 2017-03-01 NOTE — Care Management Note (Addendum)
Case Management Note  Patient Details  Name: Jacqueline Tate MRN: [id number] Date of Birth: Jul 09, 1931  Subjective/Objective:     Pt admitted with PE               Action/Plan:   PTA from home with husband and active. Pt is now s/p EKOS.  CM will continue to follow for discharge needs   Expected Discharge Date:                  Expected Discharge Plan:  Home/Self Care  In-House Referral:     Discharge planning Services  CM Consult  Post Acute Care Choice:    Choice offered to:     DME Arranged:    DME Agency:     HH Arranged:    HH Agency:     Status of Service:  In process, will continue to follow  If discussed at Long Length of Stay Meetings, dates discussed:    Additional Comments:  Maryclare Labrador, RN 03/01/2017, 3:35 PM

## 2017-03-01 NOTE — Progress Notes (Signed)
Name: Jacqueline Tate MRN: [id number] DOB: November 10, 1931    ADMISSION DATE:  02/28/2017 CONSULTATION DATE:  02/28/2017  REFERRING MD :  Dr. Governor Specking  CHIEF COMPLAINT:  SOB  SIGNIFICANT EVENTS  3/20 admit with PE > taken for EKOS  STUDIES:  CTA 3/20 > acute large bilateral pulmonary emboli with CT evidence of right heart strain (RV/LV Ratio = 1.6) LE duplex 3/21 >  Echo 3/21 >   SUBJECTIVE:  No acute events.  Doing well this AM.  Has some mild discomfort at catheter site.  VITAL SIGNS: Temp:  [97.5 F (36.4 C)-99.4 F (37.4 C)] 99.4 F (37.4 C) (03/21 0332) Pulse Rate:  [84-111] 99 (03/21 0700) Resp:  [12-24] 15 (03/21 0700) BP: (99-155)/(63-132) 141/81 (03/21 0700) SpO2:  [90 %-99 %] 96 % (03/21 0700) Weight:  [67.1 kg (148 lb)-71 kg (156 lb 8.4 oz)] 71 kg (156 lb 8.4 oz) (03/21 0433)  PHYSICAL EXAMINATION: General:  Elderly female of normal body habitus in NAD. Neuro:  Alert, oriented, non-focal. HEENT: [place]/AT, PERRL, no JVD. Cardiovascular:  RRR, no M//R/G. Lungs:  Clear bilaterally.  Normal resp effort. Abdomen:  Soft, non-tender, non-distended. Musculoskeletal:  No acute deformity or ROM limitation. Skin:  Grossly intact.   Recent Labs Lab 02/28/17 1415 03/01/17 0026  NA 135 139  K 4.3 3.4*  CL 106 108  CO2 21* 19*  BUN 29* 20  CREATININE 0.99 0.75  GLUCOSE 187* 124*    Recent Labs Lab 02/28/17 1415 03/01/17 0540  HGB 14.2 12.8  HCT 41.2 37.4  WBC 10.2 6.3  PLT 193 141*   Ct Angio Chest Pe W And/or Wo Contrast  Result Date: 02/28/2017 CLINICAL DATA:  83 year old female with shortness of breath. Initial encounter. EXAM: CT ANGIOGRAPHY CHEST WITH CONTRAST TECHNIQUE: Multidetector CT imaging of the chest was performed using the standard protocol during bolus administration of intravenous contrast. Multiplanar CT image reconstructions and MIPs were obtained to evaluate the vascular anatomy. CONTRAST:  75 cc Isovue 370. COMPARISON:  None. FINDINGS:  Cardiovascular: Large bilateral pulmonary emboli with evidence of right heart strain. Coronary artery calcifications. Atherosclerotic changes thoracic aorta. Exam not optimized to evaluate the aorta although no obvious dissection. Mediastinum/Nodes: Moderately large hiatal hernia. Lungs/Pleura: Hazy parenchymal changes peripheral aspect right lower lobe possibly chronic or related to pulmonary embolus. Scarring/subsegmental atelectasis left lower lobe/lingula. Upper Abdomen: Prominent contrast hepatic veins may be related to increased right heart pressure. Musculoskeletal: Degenerative changes lower thoracic/ upper lumbar spine and lower cervical spine. Review of the MIP images confirms the above findings. IMPRESSION: Positive for acute large bilateral pulmonary emboli with CT evidence of right heart strain (RV/LV Ratio = 1.6) consistent with at least submassive (intermediate risk) PE. The presence of right heart strain has been associated with an increased risk of morbidity and mortality. Please activate Code PE by paging [phone]. Aortic atherosclerosis. Moderately large hiatal hernia. Hazy parenchymal changes peripheral aspect right lower lobe possibly chronic or related to pulmonary embolus. Scarring/subsegmental atelectasis left lower lobe/lingula. These results were called by telephone at the time of interpretation on 02/28/2017 at 3:14 pm to Dr. Rudene Re , who verbally acknowledged these results. Electronically Signed   By: Genia Del M.D.   On: 02/28/2017 15:24    ASSESSMENT / PLAN:  Acute Hypoxic Respiratory Distress Secondary to Bilateral Pulmonary Emboli with evidence of right heart strain.  S/p EKOS 3/20. Plan -Maintain Oxygenation >92. -f/u on echo, LE duplex. -Heparin gtt per Pharmacy, transition to coumadin  likely today or tomorrow 3/22. -to IR today for removal of EKOS catheters.  Elevated Troponin (Suspected Demand ischemia) Elevated BNP H/O HTN Plan -Cardiac  Monitoring.  -Trend Troponin.  -Echo Pending.  -Hold home Norvasc, ASA, Lisinopril, Metoprolol in setting of hypotension.  Hypothyroidism  Plan -Continue preadmission synthroid.   Hyperglycemia  Plan -Trend Glucose.  H/O Diverticulitis, GERD Plan -advance diet to clear liquids, can advance further after EKOS catheters removed. -PPI.  H/O Anxiety  Plan -continue preadmission xanax, flexeril, duloxetine, gabapentin.  CC time: 30 min.  Will return back to ICU after EKOS catheters removed and observe for a few hours.  If does well, can transfer out to tele.   Montey Hora, [facility] Pager: [phone]  or [phone] 03/01/2017, 7:38 AM  Attending Note:  I have examined patient, reviewed labs, studies and notes. I have discussed the case with [place], and I agree with the data and plans as amended above. 83 year old woman admitted 3/20 with acute bilateral pulmonary emboli and evidence of associated right heart strain and positive troponin. She is now status post targeted lysis. Her venous catheter was just removed this morning. She denies any current dyspnea. She remains slightly tachycardic, regular. Her lungs are clear. She is awake, comfortable, interactive. Abdominal exam is benign. We will continue heparin at this time, plan to transition her to oral anticoagulation. She will likely want to decide between warfarin and Xarelto. Her husband has been on both of these medications before, currently on Xarelto. We will advance her diet, continue to hold her home antihypertensive regimen given her relative hypotension in the setting of acute pulmonary embolism. If she remains stable then we will transition her out of the ICU this afternoon. Independent critical care time is 33 minutes.   Baltazar Apo, MD, PhD 03/01/2017, 1:29 PM [facility] [phone] or if no answer [phone]

## 2017-03-01 NOTE — Consult Note (Addendum)
[facility] for heparin Indication: pulmonary embolus  Allergies  Allergen Reactions  . Codeine Nausea And Vomiting  . Latex   . Penicillins Rash    Has patient had a PCN reaction causing immediate rash, facial/tongue/throat swelling, SOB or lightheadedness with hypotension: Yes Has patient had a PCN reaction causing severe rash involving mucus membranes or skin necrosis: No Has patient had a PCN reaction that required hospitalization No Has patient had a PCN reaction occurring within the last 10 years: Yes If all of the above answers are "NO", then may proceed with Cephalosporin use.   . Sulfa Antibiotics Rash    Patient Measurements: Height: 5\' 5"  (165.1 cm) Weight: 156 lb 8.4 oz (71 kg) IBW/kg (Calculated) : 57 Heparin Dosing Weight: 67.1kg  Vital Signs: Temp: 99.4 F (37.4 C) (03/21 0332) Temp Source: Oral (03/21 0332) BP: 129/70 (03/21 1000) Pulse Rate: 108 (03/21 1000)  Labs:  Recent Labs  02/28/17 1415 02/28/17 1421 02/28/17 2340 03/01/17 0026 03/01/17 0333 03/01/17 0540 03/01/17 0930 03/01/17 1033  HGB 14.2  --   --   --   --  12.8 12.8  --   HCT 41.2  --   --   --   --  37.4 37.6  --   PLT 193  --   --   --   --  141* 150  --   APTT  --  31  --   --   --   --   --   --   LABPROT  --  14.3  --   --   --   --   --   --   INR  --  1.11  --   --   --   --   --   --   HEPARINUNFRC  --   --  0.99*  --   --  0.69  --  0.40  CREATININE 0.99  --   --  0.75  --   --   --   --   TROPONINI 0.06*  --  0.15*  --  0.15*  --  0.09*  --     Estimated Creatinine Clearance: 52.7 mL/min (by C-G formula based on SCr of 0.75 mg/dL).   Assessment: 83 y.o. female admitted with SOB, CTA showing bilateral PE with R heart strain. Patient received catheter-directed thrombolysis overnight, had bilateral infusions over 12 hours. Lytic infusions are now complete, patient has saline infusing, awaiting return to IR to remove catheters. Heparin has  simultaneously been infusing, level this morning is therapeutic. Will need to aim for higher therapeutic level once catheters are pulled. CBC wnl. RN reports small amount of bleeding from R catheter site.   Goal of Therapy:  Heparin level 0.3-0.7 units/ml Monitor platelets by anticoagulation protocol: Yes    Plan:  -Continue heparin at 950 units/hr -Check level this evening -F/u transition to oral anticoagulation   Hughes Better, PharmD, BCPS Clinical Pharmacist 03/01/2017 10:23 AM

## 2017-03-01 NOTE — Progress Notes (Signed)
  Echocardiogram 2D Echocardiogram has been performed with definity.  Aggie Cosier 03/01/2017, 3:16 PM

## 2017-03-01 NOTE — Progress Notes (Signed)
Referring Physician(s):  Dr. Nehemiah Massed  Supervising Physician: Markus Daft  Patient Status:  [facility] - In-pt  Chief Complaint:  Bilateral pulmonary emboli  Subjective: Patient feeling much better.  States her shortness of breath has significantly improved.   Allergies: Codeine; Latex; Penicillins; and Sulfa antibiotics  Medications: Prior to Admission medications   Medication Sig Start Date End Date Taking? Authorizing Provider  ALPRAZolam (XANAX) 0.25 MG tablet Take 0.25-0.5 mg by mouth at bedtime as needed for anxiety.    Historical Provider, MD  amLODipine (NORVASC) 5 MG tablet Take 1 tablet by mouth daily. 06/15/16   Historical Provider, MD  aspirin 81 MG tablet Take 81 mg by mouth daily.    Historical Provider, MD  bisacodyl (DULCOLAX) 5 MG EC tablet Take 5 mg by mouth daily as needed for moderate constipation.    Historical Provider, MD  cyclobenzaprine (FLEXERIL) 5 MG tablet Take 5 mg by mouth at bedtime.    Historical Provider, MD  docusate sodium (COLACE) 100 MG capsule Take 100 mg by mouth daily.    Historical Provider, MD  DULoxetine (CYMBALTA) 60 MG capsule Take 1 capsule by mouth daily. 05/27/16   Historical Provider, MD  fluticasone (FLONASE) 50 MCG/ACT nasal spray Place 1 spray into both nostrils daily as needed. 06/17/16   Historical Provider, MD  gabapentin (NEURONTIN) 600 MG tablet Take 1 tablet by mouth 3 (three) times daily. 05/27/16   Historical Provider, MD  levothyroxine (SYNTHROID, LEVOTHROID) 50 MCG tablet Take 1 tablet by mouth daily. 05/27/16   Historical Provider, MD  lisinopril-hydrochlorothiazide (PRINZIDE,ZESTORETIC) 20-12.5 MG tablet Take 1 tablet by mouth daily. 05/13/16   Historical Provider, MD  loratadine (CLARITIN) 10 MG tablet Take 10 mg by mouth daily.    Historical Provider, MD  meloxicam (MOBIC) 7.5 MG tablet Take 1 tablet by mouth 2 (two) times daily. 05/27/16   Historical Provider, MD  metoCLOPramide (REGLAN) 10 MG tablet Take 5 mg by mouth daily.  05/27/16   Historical Provider, MD  metoprolol (LOPRESSOR) 50 MG tablet Take 1 tablet by mouth 2 (two) times daily. 05/27/16   Historical Provider, MD  omeprazole (PRILOSEC) 20 MG capsule Take 20 mg by mouth 2 (two) times daily.     Historical Provider, MD  Probiotic Product ([place]) Take 1 capsule by mouth daily.    Historical Provider, MD  traZODone (DESYREL) 50 MG tablet Take 50 mg by mouth at bedtime.  04/19/16   Historical Provider, MD     Vital Signs: BP 132/69 (BP Location: Right Arm)   Pulse (!) 110   Temp 99.4 F (37.4 C) (Oral)   Resp 13   Ht 5\' 5"  (1.651 m)   Wt 156 lb 8.4 oz (71 kg)   SpO2 93%   BMI 26.05 kg/m   Physical Exam  Alert, NAD Neck: sheath in place, no bleeding  Imaging: Ct Angio Chest Pe W And/or Wo Contrast  Result Date: 02/28/2017 CLINICAL DATA:  83 year old female with shortness of breath. Initial encounter. EXAM: CT ANGIOGRAPHY CHEST WITH CONTRAST TECHNIQUE: Multidetector CT imaging of the chest was performed using the standard protocol during bolus administration of intravenous contrast. Multiplanar CT image reconstructions and MIPs were obtained to evaluate the vascular anatomy. CONTRAST:  75 cc Isovue 370. COMPARISON:  None. FINDINGS: Cardiovascular: Large bilateral pulmonary emboli with evidence of right heart strain. Coronary artery calcifications. Atherosclerotic changes thoracic aorta. Exam not optimized to evaluate the aorta although no obvious dissection. Mediastinum/Nodes: Moderately large  hiatal hernia. Lungs/Pleura: Hazy parenchymal changes peripheral aspect right lower lobe possibly chronic or related to pulmonary embolus. Scarring/subsegmental atelectasis left lower lobe/lingula. Upper Abdomen: Prominent contrast hepatic veins may be related to increased right heart pressure. Musculoskeletal: Degenerative changes lower thoracic/ upper lumbar spine and lower cervical spine. Review of the MIP images confirms the above findings.  IMPRESSION: Positive for acute large bilateral pulmonary emboli with CT evidence of right heart strain (RV/LV Ratio = 1.6) consistent with at least submassive (intermediate risk) PE. The presence of right heart strain has been associated with an increased risk of morbidity and mortality. Please activate Code PE by paging [phone]. Aortic atherosclerosis. Moderately large hiatal hernia. Hazy parenchymal changes peripheral aspect right lower lobe possibly chronic or related to pulmonary embolus. Scarring/subsegmental atelectasis left lower lobe/lingula. These results were called by telephone at the time of interpretation on 02/28/2017 at 3:14 pm to Dr. Rudene Re , who verbally acknowledged these results. Electronically Signed   By: Genia Del M.D.   On: 02/28/2017 15:24   Ir Angiogram Pulmonary Bilateral Selective  Result Date: 03/01/2017 INDICATION: 83 year old with large bilateral pulmonary emboli and right heart strain. Patient is a candidate for ultrasound assisted pulmonary emboli thrombolysis. EXAM: PULMONARY ARTERIOGRAPHY WITH SELECTIVE ANGIOGRAPHY OF BILATERAL PULMONARY ARTERIES PLACEMENT OF BILATERAL EKOS INFUSION CATHETERS IN PULMONARY ARTERIES ULTRASOUND GUIDANCE FOR VASCULAR ACCESS X 2 COMPARISON:  Chest CTA 02/28/2017 MEDICATIONS: None. ANESTHESIA/SEDATION: Versed 0.5 mg IV; Fentanyl 25 mcg IV Moderate Sedation Time:  45 minutes The patient was continuously monitored during the procedure by the interventional radiology nurse under my direct supervision. FLUOROSCOPY TIME:  Fluoroscopy Time: 11 minutes 24 seconds (45 mGy). CONTRAST:  25 mL [id number] COMPLICATIONS: None immediate. TECHNIQUE: Informed written consent was obtained from the patient after a thorough discussion of the procedural risks, benefits and alternatives. All questions were addressed. Maximal Sterile Barrier Technique was utilized including caps, mask, sterile gowns, sterile gloves, sterile drape, hand hygiene and skin  antiseptic. A timeout was performed prior to the initiation of the procedure. Patient was placed supine. Ultrasound demonstrated a large patent right internal jugular vein. The right side of the neck was prepped and draped in sterile fashion. Skin was anesthetized with 1% lidocaine. Using ultrasound guidance, 21 gauge needle was directed into the right internal jugular vein and a micropuncture dilator set was placed. A second micropuncture dilator set was placed with ultrasound guidance just below the other catheter. Subsequently, the micro catheters were upsized to 6 [place] vascular sheaths. C2 catheter and Bentson wire were advanced through the heart and placed in the right pulmonary artery. Right pulmonary artery pressure and angiography performed. Catheter was advanced into a right lower lobe pulmonary artery and a Rosen wire was placed. C2 was again used to navigate the heart and was advanced into the left pulmonary artery. Left pulmonary artery pressure and arteriogram was performed. Catheter was then advanced into a left lower lobe pulmonary artery using a Glidewire. A second Rosen wire was placed. A 12 cm infusion length EKOS catheter was advanced over the Rosen wire and positioned in the left pulmonary artery. Small amount of contrast was injected in the lower lobe pulmonary artery to confirm adequate positioning. A second 12 cm EKOS catheter was advanced over the other Rosen wire and advanced into the right lower lobe pulmonary artery. Again, a small amount of contrast was injected to confirm adequate placement. Both sheaths were sutured to the skin. The infusion catheters were secured to the sheaths. Patient was transferred to  the ICU and the tPA infusion was started through both catheters. Fluoroscopic and ultrasound images were taken and saved for documentation. FINDINGS: Right pulmonary artery pressure:  51/25, mean of 35 Left pulmonary artery pressure:  48/21, mean of 33 Infusion catheters placed in  the bilateral lower lobe pulmonary arteries. IMPRESSION: Successful placement of bilateral pulmonary artery infusion EKOS catheters. Plan for ultrasound assisted pulmonary embolism thrombolysis through bilateral catheters. Plan for 1 mg of tPA through each catheter for 12 hours, total dose of 24 mg. Plan to recheck pulmonary artery pressures following the 12 hour infusion. Elevated pulmonary artery pressures as described. Electronically Signed   By: Markus Daft M.D.   On: 03/01/2017 08:22   Ir Angiogram Selective Each Additional Vessel  Result Date: 03/01/2017 INDICATION: 83 year old with large bilateral pulmonary emboli and right heart strain. Patient is a candidate for ultrasound assisted pulmonary emboli thrombolysis. EXAM: PULMONARY ARTERIOGRAPHY WITH SELECTIVE ANGIOGRAPHY OF BILATERAL PULMONARY ARTERIES PLACEMENT OF BILATERAL EKOS INFUSION CATHETERS IN PULMONARY ARTERIES ULTRASOUND GUIDANCE FOR VASCULAR ACCESS X 2 COMPARISON:  Chest CTA 02/28/2017 MEDICATIONS: None. ANESTHESIA/SEDATION: Versed 0.5 mg IV; Fentanyl 25 mcg IV Moderate Sedation Time:  45 minutes The patient was continuously monitored during the procedure by the interventional radiology nurse under my direct supervision. FLUOROSCOPY TIME:  Fluoroscopy Time: 11 minutes 24 seconds (45 mGy). CONTRAST:  25 mL [id number] COMPLICATIONS: None immediate. TECHNIQUE: Informed written consent was obtained from the patient after a thorough discussion of the procedural risks, benefits and alternatives. All questions were addressed. Maximal Sterile Barrier Technique was utilized including caps, mask, sterile gowns, sterile gloves, sterile drape, hand hygiene and skin antiseptic. A timeout was performed prior to the initiation of the procedure. Patient was placed supine. Ultrasound demonstrated a large patent right internal jugular vein. The right side of the neck was prepped and draped in sterile fashion. Skin was anesthetized with 1% lidocaine. Using  ultrasound guidance, 21 gauge needle was directed into the right internal jugular vein and a micropuncture dilator set was placed. A second micropuncture dilator set was placed with ultrasound guidance just below the other catheter. Subsequently, the micro catheters were upsized to 6 [place] vascular sheaths. C2 catheter and Bentson wire were advanced through the heart and placed in the right pulmonary artery. Right pulmonary artery pressure and angiography performed. Catheter was advanced into a right lower lobe pulmonary artery and a Rosen wire was placed. C2 was again used to navigate the heart and was advanced into the left pulmonary artery. Left pulmonary artery pressure and arteriogram was performed. Catheter was then advanced into a left lower lobe pulmonary artery using a Glidewire. A second Rosen wire was placed. A 12 cm infusion length EKOS catheter was advanced over the Rosen wire and positioned in the left pulmonary artery. Small amount of contrast was injected in the lower lobe pulmonary artery to confirm adequate positioning. A second 12 cm EKOS catheter was advanced over the other Rosen wire and advanced into the right lower lobe pulmonary artery. Again, a small amount of contrast was injected to confirm adequate placement. Both sheaths were sutured to the skin. The infusion catheters were secured to the sheaths. Patient was transferred to the ICU and the tPA infusion was started through both catheters. Fluoroscopic and ultrasound images were taken and saved for documentation. FINDINGS: Right pulmonary artery pressure:  51/25, mean of 35 Left pulmonary artery pressure:  48/21, mean of 33 Infusion catheters placed in the bilateral lower lobe pulmonary arteries. IMPRESSION: Successful placement of bilateral pulmonary  artery infusion EKOS catheters. Plan for ultrasound assisted pulmonary embolism thrombolysis through bilateral catheters. Plan for 1 mg of tPA through each catheter for 12 hours, total dose  of 24 mg. Plan to recheck pulmonary artery pressures following the 12 hour infusion. Elevated pulmonary artery pressures as described. Electronically Signed   By: Markus Daft M.D.   On: 03/01/2017 08:22   Ir Angiogram Selective Each Additional Vessel  Result Date: 03/01/2017 INDICATION: 83 year old with large bilateral pulmonary emboli and right heart strain. Patient is a candidate for ultrasound assisted pulmonary emboli thrombolysis. EXAM: PULMONARY ARTERIOGRAPHY WITH SELECTIVE ANGIOGRAPHY OF BILATERAL PULMONARY ARTERIES PLACEMENT OF BILATERAL EKOS INFUSION CATHETERS IN PULMONARY ARTERIES ULTRASOUND GUIDANCE FOR VASCULAR ACCESS X 2 COMPARISON:  Chest CTA 02/28/2017 MEDICATIONS: None. ANESTHESIA/SEDATION: Versed 0.5 mg IV; Fentanyl 25 mcg IV Moderate Sedation Time:  45 minutes The patient was continuously monitored during the procedure by the interventional radiology nurse under my direct supervision. FLUOROSCOPY TIME:  Fluoroscopy Time: 11 minutes 24 seconds (45 mGy). CONTRAST:  25 mL [id number] COMPLICATIONS: None immediate. TECHNIQUE: Informed written consent was obtained from the patient after a thorough discussion of the procedural risks, benefits and alternatives. All questions were addressed. Maximal Sterile Barrier Technique was utilized including caps, mask, sterile gowns, sterile gloves, sterile drape, hand hygiene and skin antiseptic. A timeout was performed prior to the initiation of the procedure. Patient was placed supine. Ultrasound demonstrated a large patent right internal jugular vein. The right side of the neck was prepped and draped in sterile fashion. Skin was anesthetized with 1% lidocaine. Using ultrasound guidance, 21 gauge needle was directed into the right internal jugular vein and a micropuncture dilator set was placed. A second micropuncture dilator set was placed with ultrasound guidance just below the other catheter. Subsequently, the micro catheters were upsized to 6 [place]  vascular sheaths. C2 catheter and Bentson wire were advanced through the heart and placed in the right pulmonary artery. Right pulmonary artery pressure and angiography performed. Catheter was advanced into a right lower lobe pulmonary artery and a Rosen wire was placed. C2 was again used to navigate the heart and was advanced into the left pulmonary artery. Left pulmonary artery pressure and arteriogram was performed. Catheter was then advanced into a left lower lobe pulmonary artery using a Glidewire. A second Rosen wire was placed. A 12 cm infusion length EKOS catheter was advanced over the Rosen wire and positioned in the left pulmonary artery. Small amount of contrast was injected in the lower lobe pulmonary artery to confirm adequate positioning. A second 12 cm EKOS catheter was advanced over the other Rosen wire and advanced into the right lower lobe pulmonary artery. Again, a small amount of contrast was injected to confirm adequate placement. Both sheaths were sutured to the skin. The infusion catheters were secured to the sheaths. Patient was transferred to the ICU and the tPA infusion was started through both catheters. Fluoroscopic and ultrasound images were taken and saved for documentation. FINDINGS: Right pulmonary artery pressure:  51/25, mean of 35 Left pulmonary artery pressure:  48/21, mean of 33 Infusion catheters placed in the bilateral lower lobe pulmonary arteries. IMPRESSION: Successful placement of bilateral pulmonary artery infusion EKOS catheters. Plan for ultrasound assisted pulmonary embolism thrombolysis through bilateral catheters. Plan for 1 mg of tPA through each catheter for 12 hours, total dose of 24 mg. Plan to recheck pulmonary artery pressures following the 12 hour infusion. Elevated pulmonary artery pressures as described. Electronically Signed   By: Markus Daft  M.D.   On: 03/01/2017 08:22   Ir US Guide Vasc Access Right  Result Date: 03/01/2017 INDICATION: 83 year old with  large bilateral pulmonary emboli and right heart strain. Patient is a candidate for ultrasound assisted pulmonary emboli thrombolysis. EXAM: PULMONARY ARTERIOGRAPHY WITH SELECTIVE ANGIOGRAPHY OF BILATERAL PULMONARY ARTERIES PLACEMENT OF BILATERAL EKOS INFUSION CATHETERS IN PULMONARY ARTERIES ULTRASOUND GUIDANCE FOR VASCULAR ACCESS X 2 COMPARISON:  Chest CTA 02/28/2017 MEDICATIONS: None. ANESTHESIA/SEDATION: Versed 0.5 mg IV; Fentanyl 25 mcg IV Moderate Sedation Time:  45 minutes The patient was continuously monitored during the procedure by the interventional radiology nurse under my direct supervision. FLUOROSCOPY TIME:  Fluoroscopy Time: 11 minutes 24 seconds (45 mGy). CONTRAST:  25 mL [id number] COMPLICATIONS: None immediate. TECHNIQUE: Informed written consent was obtained from the patient after a thorough discussion of the procedural risks, benefits and alternatives. All questions were addressed. Maximal Sterile Barrier Technique was utilized including caps, mask, sterile gowns, sterile gloves, sterile drape, hand hygiene and skin antiseptic. A timeout was performed prior to the initiation of the procedure. Patient was placed supine. Ultrasound demonstrated a large patent right internal jugular vein. The right side of the neck was prepped and draped in sterile fashion. Skin was anesthetized with 1% lidocaine. Using ultrasound guidance, 21 gauge needle was directed into the right internal jugular vein and a micropuncture dilator set was placed. A second micropuncture dilator set was placed with ultrasound guidance just below the other catheter. Subsequently, the micro catheters were upsized to 6 [place] vascular sheaths. C2 catheter and Bentson wire were advanced through the heart and placed in the right pulmonary artery. Right pulmonary artery pressure and angiography performed. Catheter was advanced into a right lower lobe pulmonary artery and a Rosen wire was placed. C2 was again used to navigate the heart and  was advanced into the left pulmonary artery. Left pulmonary artery pressure and arteriogram was performed. Catheter was then advanced into a left lower lobe pulmonary artery using a Glidewire. A second Rosen wire was placed. A 12 cm infusion length EKOS catheter was advanced over the Rosen wire and positioned in the left pulmonary artery. Small amount of contrast was injected in the lower lobe pulmonary artery to confirm adequate positioning. A second 12 cm EKOS catheter was advanced over the other Rosen wire and advanced into the right lower lobe pulmonary artery. Again, a small amount of contrast was injected to confirm adequate placement. Both sheaths were sutured to the skin. The infusion catheters were secured to the sheaths. Patient was transferred to the ICU and the tPA infusion was started through both catheters. Fluoroscopic and ultrasound images were taken and saved for documentation. FINDINGS: Right pulmonary artery pressure:  51/25, mean of 35 Left pulmonary artery pressure:  48/21, mean of 33 Infusion catheters placed in the bilateral lower lobe pulmonary arteries. IMPRESSION: Successful placement of bilateral pulmonary artery infusion EKOS catheters. Plan for ultrasound assisted pulmonary embolism thrombolysis through bilateral catheters. Plan for 1 mg of tPA through each catheter for 12 hours, total dose of 24 mg. Plan to recheck pulmonary artery pressures following the 12 hour infusion. Elevated pulmonary artery pressures as described. Electronically Signed   By: Markus Daft M.D.   On: 03/01/2017 08:22   Ir Infusion Thrombol Arterial Initial (ms)  Result Date: 03/01/2017 INDICATION: 83 year old with large bilateral pulmonary emboli and right heart strain. Patient is a candidate for ultrasound assisted pulmonary emboli thrombolysis. EXAM: PULMONARY ARTERIOGRAPHY WITH SELECTIVE ANGIOGRAPHY OF BILATERAL PULMONARY ARTERIES PLACEMENT OF BILATERAL EKOS INFUSION CATHETERS IN  PULMONARY ARTERIES ULTRASOUND  GUIDANCE FOR VASCULAR ACCESS X 2 COMPARISON:  Chest CTA 02/28/2017 MEDICATIONS: None. ANESTHESIA/SEDATION: Versed 0.5 mg IV; Fentanyl 25 mcg IV Moderate Sedation Time:  45 minutes The patient was continuously monitored during the procedure by the interventional radiology nurse under my direct supervision. FLUOROSCOPY TIME:  Fluoroscopy Time: 11 minutes 24 seconds (45 mGy). CONTRAST:  25 mL [id number] COMPLICATIONS: None immediate. TECHNIQUE: Informed written consent was obtained from the patient after a thorough discussion of the procedural risks, benefits and alternatives. All questions were addressed. Maximal Sterile Barrier Technique was utilized including caps, mask, sterile gowns, sterile gloves, sterile drape, hand hygiene and skin antiseptic. A timeout was performed prior to the initiation of the procedure. Patient was placed supine. Ultrasound demonstrated a large patent right internal jugular vein. The right side of the neck was prepped and draped in sterile fashion. Skin was anesthetized with 1% lidocaine. Using ultrasound guidance, 21 gauge needle was directed into the right internal jugular vein and a micropuncture dilator set was placed. A second micropuncture dilator set was placed with ultrasound guidance just below the other catheter. Subsequently, the micro catheters were upsized to 6 [place] vascular sheaths. C2 catheter and Bentson wire were advanced through the heart and placed in the right pulmonary artery. Right pulmonary artery pressure and angiography performed. Catheter was advanced into a right lower lobe pulmonary artery and a Rosen wire was placed. C2 was again used to navigate the heart and was advanced into the left pulmonary artery. Left pulmonary artery pressure and arteriogram was performed. Catheter was then advanced into a left lower lobe pulmonary artery using a Glidewire. A second Rosen wire was placed. A 12 cm infusion length EKOS catheter was advanced over the Rosen wire and  positioned in the left pulmonary artery. Small amount of contrast was injected in the lower lobe pulmonary artery to confirm adequate positioning. A second 12 cm EKOS catheter was advanced over the other Rosen wire and advanced into the right lower lobe pulmonary artery. Again, a small amount of contrast was injected to confirm adequate placement. Both sheaths were sutured to the skin. The infusion catheters were secured to the sheaths. Patient was transferred to the ICU and the tPA infusion was started through both catheters. Fluoroscopic and ultrasound images were taken and saved for documentation. FINDINGS: Right pulmonary artery pressure:  51/25, mean of 35 Left pulmonary artery pressure:  48/21, mean of 33 Infusion catheters placed in the bilateral lower lobe pulmonary arteries. IMPRESSION: Successful placement of bilateral pulmonary artery infusion EKOS catheters. Plan for ultrasound assisted pulmonary embolism thrombolysis through bilateral catheters. Plan for 1 mg of tPA through each catheter for 12 hours, total dose of 24 mg. Plan to recheck pulmonary artery pressures following the 12 hour infusion. Elevated pulmonary artery pressures as described. Electronically Signed   By: Markus Daft M.D.   On: 03/01/2017 08:22   Ir Infusion Thrombol Arterial Initial (ms)  Result Date: 03/01/2017 INDICATION: 83 year old with large bilateral pulmonary emboli and right heart strain. Patient is a candidate for ultrasound assisted pulmonary emboli thrombolysis. EXAM: PULMONARY ARTERIOGRAPHY WITH SELECTIVE ANGIOGRAPHY OF BILATERAL PULMONARY ARTERIES PLACEMENT OF BILATERAL EKOS INFUSION CATHETERS IN PULMONARY ARTERIES ULTRASOUND GUIDANCE FOR VASCULAR ACCESS X 2 COMPARISON:  Chest CTA 02/28/2017 MEDICATIONS: None. ANESTHESIA/SEDATION: Versed 0.5 mg IV; Fentanyl 25 mcg IV Moderate Sedation Time:  45 minutes The patient was continuously monitored during the procedure by the interventional radiology nurse under my direct  supervision. FLUOROSCOPY TIME:  Fluoroscopy Time: 11 minutes  24 seconds (45 mGy). CONTRAST:  25 mL [id number] COMPLICATIONS: None immediate. TECHNIQUE: Informed written consent was obtained from the patient after a thorough discussion of the procedural risks, benefits and alternatives. All questions were addressed. Maximal Sterile Barrier Technique was utilized including caps, mask, sterile gowns, sterile gloves, sterile drape, hand hygiene and skin antiseptic. A timeout was performed prior to the initiation of the procedure. Patient was placed supine. Ultrasound demonstrated a large patent right internal jugular vein. The right side of the neck was prepped and draped in sterile fashion. Skin was anesthetized with 1% lidocaine. Using ultrasound guidance, 21 gauge needle was directed into the right internal jugular vein and a micropuncture dilator set was placed. A second micropuncture dilator set was placed with ultrasound guidance just below the other catheter. Subsequently, the micro catheters were upsized to 6 [place] vascular sheaths. C2 catheter and Bentson wire were advanced through the heart and placed in the right pulmonary artery. Right pulmonary artery pressure and angiography performed. Catheter was advanced into a right lower lobe pulmonary artery and a Rosen wire was placed. C2 was again used to navigate the heart and was advanced into the left pulmonary artery. Left pulmonary artery pressure and arteriogram was performed. Catheter was then advanced into a left lower lobe pulmonary artery using a Glidewire. A second Rosen wire was placed. A 12 cm infusion length EKOS catheter was advanced over the Rosen wire and positioned in the left pulmonary artery. Small amount of contrast was injected in the lower lobe pulmonary artery to confirm adequate positioning. A second 12 cm EKOS catheter was advanced over the other Rosen wire and advanced into the right lower lobe pulmonary artery. Again, a small amount of  contrast was injected to confirm adequate placement. Both sheaths were sutured to the skin. The infusion catheters were secured to the sheaths. Patient was transferred to the ICU and the tPA infusion was started through both catheters. Fluoroscopic and ultrasound images were taken and saved for documentation. FINDINGS: Right pulmonary artery pressure:  51/25, mean of 35 Left pulmonary artery pressure:  48/21, mean of 33 Infusion catheters placed in the bilateral lower lobe pulmonary arteries. IMPRESSION: Successful placement of bilateral pulmonary artery infusion EKOS catheters. Plan for ultrasound assisted pulmonary embolism thrombolysis through bilateral catheters. Plan for 1 mg of tPA through each catheter for 12 hours, total dose of 24 mg. Plan to recheck pulmonary artery pressures following the 12 hour infusion. Elevated pulmonary artery pressures as described. Electronically Signed   By: Markus Daft M.D.   On: 03/01/2017 08:22   Ir Jacolyn Reedy F/u Eval Art/ven Final Day (ms)  Result Date: 03/01/2017 INDICATION: 83 year old female with large bilateral pulmonary emboli and recently completed a 12 hour infusion of tPA through bilateral EKOS catheters. Total dose of 24 mg of tPA was given. Patient says that her breathing has improved. No signs of bleeding. EXAM: FOLLOW-UP THROMBOLYTIC THERAPY- FINAL DAY COMPARISON:  None. MEDICATIONS: None. ANESTHESIA/SEDATION: None FLUOROSCOPY TIME:  None COMPLICATIONS: None immediate. TECHNIQUE: Pulmonary artery pressures were obtained from EKOS catheters. Catheters were slightly pulled back so that the pressures were obtained in the main left and right pulmonary arteries. The infusion catheters were completely removed. The right jugular vascular sheaths were removed with manual compression. Bandage placed over the puncture sites. FINDINGS: The mean pulmonary artery pressure in bilateral pulmonary arteries is 20. Mean pulmonary artery pressures prior to thrombolytic therapy was 33  on the left and 35 on the right. IMPRESSION: Decreased pulmonary artery pressures bilaterally.  Completion of the pulmonary emboli thrombolysis. Electronically Signed   By: Markus Daft M.D.   On: 03/01/2017 13:29    Labs:  CBC:  Recent Labs  07/09/16 1304 02/28/17 1415 03/01/17 0540 03/01/17 0930  WBC 8.7 10.2 6.3 6.9  HGB 14.7 14.2 12.8 12.8  HCT 41.5 41.2 37.4 37.6  PLT 197 193 141* 150    COAGS:  Recent Labs  02/28/17 1421  INR 1.11  APTT 31    BMP:  Recent Labs  07/09/16 1304 02/28/17 1415 03/01/17 0026  NA 137 135 139  K 3.3* 4.3 3.4*  CL 102 106 108  CO2 28 21* 19*  GLUCOSE 52* 187* 124*  BUN 20 29* 20  CALCIUM 9.5 8.8* 8.4*  CREATININE 0.93 0.99 0.75  GFRNONAA 55* 51* >60  GFRAA >60 59* >60    LIVER FUNCTION TESTS:  Recent Labs  07/09/16 1304  BILITOT 0.3  AST 28  ALT 23  ALKPHOS 134*  PROT 7.8  ALBUMIN 4.3    Assessment and Plan: Bilateral pulmonary embolus with right heart strain s/p placement of bilateral EKOS catheter for TPa infusion.  Visited patient with Dr. Anselm Pancoast today.  Fibrinogen 427 this AM.  Patient resting comfortably with sheath in place. States she is feeling much better.  Pulmonary mean arterial pressure: right- 20, left- 19 Ok to pull sheath per Dr. Anselm Pancoast.   Continues heparin infusion.  IR to follow.   Electronically Signed: Docia Barrier 03/01/2017, 2:05 PM   I spent a total of 15 Minutes at the the patient's bedside AND on the patient's hospital floor or unit, greater than 50% of which was counseling/coordinating care for pulmonary embolus

## 2017-03-01 NOTE — Consult Note (Signed)
[place] for heparin Indication: pulmonary embolus  Allergies  Allergen Reactions  . Codeine Nausea And Vomiting  . Latex   . Penicillins Rash    Has patient had a PCN reaction causing immediate rash, facial/tongue/throat swelling, SOB or lightheadedness with hypotension: Yes Has patient had a PCN reaction causing severe rash involving mucus membranes or skin necrosis: No Has patient had a PCN reaction that required hospitalization No Has patient had a PCN reaction occurring within the last 10 years: Yes If all of the above answers are "NO", then may proceed with Cephalosporin use.   . Sulfa Antibiotics Rash    Patient Measurements: Height: 5\' 5"  (165.1 cm) Weight: 156 lb 8.4 oz (71 kg) IBW/kg (Calculated) : 57 Heparin Dosing Weight: 67.1kg  Vital Signs: Temp: 98 F (36.7 C) (03/21 1516) Temp Source: Oral (03/21 1516) BP: 125/68 (03/21 1900) Pulse Rate: 112 (03/21 1900)  Labs:  Recent Labs  02/28/17 1415 02/28/17 1421  02/28/17 2340 03/01/17 0026 03/01/17 0333 03/01/17 0540 03/01/17 0930  03/01/17 1033 03/01/17 1530 03/01/17 1810  HGB 14.2  --   --   --   --   --  12.8 12.8  --   --   --   --   HCT 41.2  --   --   --   --   --  37.4 37.6  --   --   --   --   PLT 193  --   --   --   --   --  141* 150  --   --   --   --   APTT  --  31  --   --   --   --   --   --   --   --   --   --   LABPROT  --  14.3  --   --   --   --   --   --   --   --   --   --   INR  --  1.11  --   --   --   --   --   --   --   --   --   --   HEPARINUNFRC  --   --   < > 0.99*  --   --  0.69  --   < > 0.40 0.49 0.53  CREATININE 0.99  --   --   --  0.75  --   --   --   --   --   --   --   TROPONINI 0.06*  --   --  0.15*  --  0.15*  --  0.09*  --   --   --   --   < > = values in this interval not displayed.  Estimated Creatinine Clearance: 52.7 mL/min (by C-G formula based on SCr of 0.75 mg/dL).   Assessment: 83 y.o. female admitted with SOB, CTA  showing bilateral PE with R heart strain. Patient received catheter-directed thrombolysis overnight, had bilateral infusions over 12 hours. Lytic infusions are now complete, patient has saline infusing, awaiting return to IR to remove catheters. Heparin has simultaneously been infusing, level this morning is therapeutic. Will need to aim for higher therapeutic level once catheters are pulled. CBC wnl. RN reports small amount of bleeding from R catheter site this morning.  Patient completed 12 hour thrombolysis infusion without complication. Heparin level therapeutic this  evening at 0.53. Vascular US positive for acute DVT. Verified with RN that catheters have been removed.   Goal of Therapy:  Heparin level 0.3-0.7 units/ml Monitor platelets by anticoagulation protocol: Yes    Plan:  Increase heparin to 1000 units/hr Check anti-Xa level in 8 hours and daily while on heparin Continue to monitor H&H and platelets F/u transition to oral anticoagulation    Thank you for allowing [place] to participate in this patients care. Jens Som, PharmD Pager: [phone]

## 2017-03-02 LAB — CBC
HEMATOCRIT: 36.5 % (ref 36.0–46.0)
Hemoglobin: 12 g/dL (ref 12.0–15.0)
MCH: 31.1 pg (ref 26.0–34.0)
MCHC: 32.9 g/dL (ref 30.0–36.0)
MCV: 94.6 fL (ref 78.0–100.0)
PLATELETS: 115 10*3/uL — AB (ref 150–400)
RBC: 3.86 MIL/uL — ABNORMAL LOW (ref 3.87–5.11)
RDW: 14.7 % (ref 11.5–15.5)
WBC: 4.6 10*3/uL (ref 4.0–10.5)

## 2017-03-02 LAB — BASIC METABOLIC PANEL
ANION GAP: 10 (ref 5–15)
BUN: 15 mg/dL (ref 6–20)
CO2: 22 mmol/L (ref 22–32)
Calcium: 9 mg/dL (ref 8.9–10.3)
Chloride: 105 mmol/L (ref 101–111)
Creatinine, Ser: 0.9 mg/dL (ref 0.44–1.00)
GFR calc Af Amer: >60 mL/min (ref >60)
GFR calc non Af Amer: 58 mL/min — ABNORMAL LOW (ref >60)
Glucose, Bld: 200 mg/dL — ABNORMAL HIGH (ref 65–99)
POTASSIUM: 4.1 mmol/L (ref 3.5–5.1)
Sodium: 137 mmol/L (ref 135–145)

## 2017-03-02 LAB — GLUCOSE, CAPILLARY
GLUCOSE-CAPILLARY: 164 mg/dL — AB (ref 65–99)
GLUCOSE-CAPILLARY: 96 mg/dL (ref 65–99)
GLUCOSE-CAPILLARY: 98 mg/dL (ref 65–99)
Glucose-Capillary: 117 mg/dL — ABNORMAL HIGH (ref 65–99)
Glucose-Capillary: 127 mg/dL — ABNORMAL HIGH (ref 65–99)
Glucose-Capillary: 146 mg/dL — ABNORMAL HIGH (ref 65–99)

## 2017-03-02 LAB — HEPARIN LEVEL (UNFRACTIONATED): Heparin Unfractionated: 0.49 IU/mL (ref 0.30–0.70)

## 2017-03-02 MED ORDER — WARFARIN SODIUM 5 MG PO TABS
5.0000 mg | ORAL_TABLET | Freq: Once | ORAL | Status: AC
  Administered 2017-03-02: 5 mg via ORAL
  Filled 2017-03-02: qty 1

## 2017-03-02 MED ORDER — COUMADIN BOOK
Freq: Once | Status: AC
  Administered 2017-03-02: 11:00:00
  Filled 2017-03-02: qty 1

## 2017-03-02 MED ORDER — WARFARIN VIDEO: Freq: Once | Status: DC

## 2017-03-02 MED ORDER — WARFARIN - PHARMACIST DOSING INPATIENT: Freq: Every day | Status: DC

## 2017-03-02 NOTE — Consult Note (Addendum)
[place] for heparin Indication: pulmonary embolus  Allergies  Allergen Reactions  . Codeine Nausea And Vomiting  . Latex   . Penicillins Rash    Has patient had a PCN reaction causing immediate rash, facial/tongue/throat swelling, SOB or lightheadedness with hypotension: Yes Has patient had a PCN reaction causing severe rash involving mucus membranes or skin necrosis: No Has patient had a PCN reaction that required hospitalization No Has patient had a PCN reaction occurring within the last 10 years: Yes If all of the above answers are "NO", then may proceed with Cephalosporin use.   . Sulfa Antibiotics Rash    Patient Measurements: Height: 5\' 5"  (165.1 cm) Weight: 157 lb 10.1 oz (71.5 kg) IBW/kg (Calculated) : 57 Heparin Dosing Weight: 67.1kg  Vital Signs: Temp: 97.6 F (36.4 C) (03/22 0329) Temp Source: Oral (03/22 0329) BP: 121/70 (03/22 0600) Pulse Rate: 94 (03/22 0600)  Labs:  Recent Labs  02/28/17 1415 02/28/17 1421  02/28/17 2340 03/01/17 0026 03/01/17 0333 03/01/17 0540 03/01/17 0930  03/01/17 1530 03/01/17 1810 03/02/17 0504  HGB 14.2  --   --   --   --   --  12.8 12.8  --   --   --  12.0  HCT 41.2  --   --   --   --   --  37.4 37.6  --   --   --  36.5  PLT 193  --   --   --   --   --  141* 150  --   --   --  PENDING  APTT  --  31  --   --   --   --   --   --   --   --   --   --   LABPROT  --  14.3  --   --   --   --   --   --   --   --   --   --   INR  --  1.11  --   --   --   --   --   --   --   --   --   --   HEPARINUNFRC  --   --   < > 0.99*  --   --  0.69  --   < > 0.49 0.53 0.49  CREATININE 0.99  --   --   --  0.75  --   --   --   --   --   --   --   TROPONINI 0.06*  --   --  0.15*  --  0.15*  --  0.09*  --   --   --   --   < > = values in this interval not displayed.  Estimated Creatinine Clearance: 52.8 mL/min (by C-G formula based on SCr of 0.75 mg/dL).   Assessment: 83 y.o. female admitted with SOB,  CTA showing bilateral PE with R heart strain. Patient received catheter-directed thrombolysis on 3/21, had bilateral infusions over 12 hours. Thrombolysis is now complete. Pt remains on heparin, level WNL this morning. CBC and fibrinogen stable.   Goal of Therapy:  Heparin level 0.3-0.7 units/ml Monitor platelets by anticoagulation protocol: Yes    Plan:  -Continue heparin at 1000 units/hr -Daily HL, CBC -F/u transition to oral anticoagulation   Hughes Better, PharmD, BCPS Clinical Pharmacist 03/02/2017 7:49 AM     Addendum -Ready for PO anticoagulation -  Will start warfarin this evening -Baseline INR 1.1 -Warfarin 5 mg po x1 -Daily INR -Continue heparin for a minimum of 5 days and until INR is > 2 for a minimum of 24 hours    Harvel Quale 03/02/2017 9:47 AM

## 2017-03-02 NOTE — Progress Notes (Signed)
Name: Jacqueline Tate MRN: [id number] DOB: Mar 03, 1931    ADMISSION DATE:  02/28/2017 CONSULTATION DATE:  02/28/2017  REFERRING MD :  Dr. Governor Specking  CHIEF COMPLAINT:  SOB  SIGNIFICANT EVENTS  3/20 admit with PE > taken for EKOS  STUDIES:  CTA 3/20 > acute large bilateral pulmonary emboli with CT evidence of right heart strain (RV/LV Ratio = 1.6) LE duplex 3/21 > small DVT in right distal gastrocnemius. Echo 3/21 > EF 60-65%, G1DD.  SUBJECTIVE:  No acute events.  Doing well this AM.  Has some mild discomfort at catheter site.  VITAL SIGNS: Temp:  [97.2 F (36.2 C)-98 F (36.7 C)] 97.6 F (36.4 C) (03/22 0825) Pulse Rate:  [93-123] 98 (03/22 0800) Resp:  [9-24] 14 (03/22 0800) BP: (94-149)/(60-85) 133/66 (03/22 0800) SpO2:  [93 %-97 %] 96 % (03/22 0800) Weight:  [71.5 kg (157 lb 10.1 oz)] 71.5 kg (157 lb 10.1 oz) (03/22 0318)  PHYSICAL EXAMINATION: General:  Elderly female of normal body habitus in NAD.  Visiting with family. Neuro:  Alert, oriented, non-focal. HEENT: [place]/AT, PERRL, no JVD. Cardiovascular:  RRR, no M//R/G. Lungs:  Clear bilaterally.  Normal resp effort. Abdomen:  Soft, non-tender, non-distended. Musculoskeletal:  No acute deformity or ROM limitation. Skin:  Grossly intact.   Recent Labs Lab 02/28/17 1415 03/01/17 0026  NA 135 139  K 4.3 3.4*  CL 106 108  CO2 21* 19*  BUN 29* 20  CREATININE 0.99 0.75  GLUCOSE 187* 124*    Recent Labs Lab 03/01/17 0540 03/01/17 0930 03/02/17 0504  HGB 12.8 12.8 12.0  HCT 37.4 37.6 36.5  WBC 6.3 6.9 4.6  PLT 141* 150 PENDING   Ct Angio Chest Pe W And/or Wo Contrast  Result Date: 02/28/2017 CLINICAL DATA:  83 year old female with shortness of breath. Initial encounter. EXAM: CT ANGIOGRAPHY CHEST WITH CONTRAST TECHNIQUE: Multidetector CT imaging of the chest was performed using the standard protocol during bolus administration of intravenous contrast. Multiplanar CT image reconstructions and MIPs were  obtained to evaluate the vascular anatomy. CONTRAST:  75 cc Isovue 370. COMPARISON:  None. FINDINGS: Cardiovascular: Large bilateral pulmonary emboli with evidence of right heart strain. Coronary artery calcifications. Atherosclerotic changes thoracic aorta. Exam not optimized to evaluate the aorta although no obvious dissection. Mediastinum/Nodes: Moderately large hiatal hernia. Lungs/Pleura: Hazy parenchymal changes peripheral aspect right lower lobe possibly chronic or related to pulmonary embolus. Scarring/subsegmental atelectasis left lower lobe/lingula. Upper Abdomen: Prominent contrast hepatic veins may be related to increased right heart pressure. Musculoskeletal: Degenerative changes lower thoracic/ upper lumbar spine and lower cervical spine. Review of the MIP images confirms the above findings. IMPRESSION: Positive for acute large bilateral pulmonary emboli with CT evidence of right heart strain (RV/LV Ratio = 1.6) consistent with at least submassive (intermediate risk) PE. The presence of right heart strain has been associated with an increased risk of morbidity and mortality. Please activate Code PE by paging [phone]. Aortic atherosclerosis. Moderately large hiatal hernia. Hazy parenchymal changes peripheral aspect right lower lobe possibly chronic or related to pulmonary embolus. Scarring/subsegmental atelectasis left lower lobe/lingula. These results were called by telephone at the time of interpretation on 02/28/2017 at 3:14 pm to Dr. Rudene Re , who verbally acknowledged these results. Electronically Signed   By: Genia Del M.D.   On: 02/28/2017 15:24   Ir Angiogram Pulmonary Bilateral Selective  Result Date: 03/01/2017 INDICATION: 83 year old with large bilateral pulmonary emboli and right heart strain. Patient is a candidate for ultrasound  assisted pulmonary emboli thrombolysis. EXAM: PULMONARY ARTERIOGRAPHY WITH SELECTIVE ANGIOGRAPHY OF BILATERAL PULMONARY ARTERIES PLACEMENT OF  BILATERAL EKOS INFUSION CATHETERS IN PULMONARY ARTERIES ULTRASOUND GUIDANCE FOR VASCULAR ACCESS X 2 COMPARISON:  Chest CTA 02/28/2017 MEDICATIONS: None. ANESTHESIA/SEDATION: Versed 0.5 mg IV; Fentanyl 25 mcg IV Moderate Sedation Time:  45 minutes The patient was continuously monitored during the procedure by the interventional radiology nurse under my direct supervision. FLUOROSCOPY TIME:  Fluoroscopy Time: 11 minutes 24 seconds (45 mGy). CONTRAST:  25 mL [id number] COMPLICATIONS: None immediate. TECHNIQUE: Informed written consent was obtained from the patient after a thorough discussion of the procedural risks, benefits and alternatives. All questions were addressed. Maximal Sterile Barrier Technique was utilized including caps, mask, sterile gowns, sterile gloves, sterile drape, hand hygiene and skin antiseptic. A timeout was performed prior to the initiation of the procedure. Patient was placed supine. Ultrasound demonstrated a large patent right internal jugular vein. The right side of the neck was prepped and draped in sterile fashion. Skin was anesthetized with 1% lidocaine. Using ultrasound guidance, 21 gauge needle was directed into the right internal jugular vein and a micropuncture dilator set was placed. A second micropuncture dilator set was placed with ultrasound guidance just below the other catheter. Subsequently, the micro catheters were upsized to 6 [place] vascular sheaths. C2 catheter and Bentson wire were advanced through the heart and placed in the right pulmonary artery. Right pulmonary artery pressure and angiography performed. Catheter was advanced into a right lower lobe pulmonary artery and a Rosen wire was placed. C2 was again used to navigate the heart and was advanced into the left pulmonary artery. Left pulmonary artery pressure and arteriogram was performed. Catheter was then advanced into a left lower lobe pulmonary artery using a Glidewire. A second Rosen wire was placed. A 12 cm  infusion length EKOS catheter was advanced over the Rosen wire and positioned in the left pulmonary artery. Small amount of contrast was injected in the lower lobe pulmonary artery to confirm adequate positioning. A second 12 cm EKOS catheter was advanced over the other Rosen wire and advanced into the right lower lobe pulmonary artery. Again, a small amount of contrast was injected to confirm adequate placement. Both sheaths were sutured to the skin. The infusion catheters were secured to the sheaths. Patient was transferred to the ICU and the tPA infusion was started through both catheters. Fluoroscopic and ultrasound images were taken and saved for documentation. FINDINGS: Right pulmonary artery pressure:  51/25, mean of 35 Left pulmonary artery pressure:  48/21, mean of 33 Infusion catheters placed in the bilateral lower lobe pulmonary arteries. IMPRESSION: Successful placement of bilateral pulmonary artery infusion EKOS catheters. Plan for ultrasound assisted pulmonary embolism thrombolysis through bilateral catheters. Plan for 1 mg of tPA through each catheter for 12 hours, total dose of 24 mg. Plan to recheck pulmonary artery pressures following the 12 hour infusion. Elevated pulmonary artery pressures as described. Electronically Signed   By: Markus Daft M.D.   On: 03/01/2017 08:22   Ir Angiogram Selective Each Additional Vessel  Result Date: 03/01/2017 INDICATION: 83 year old with large bilateral pulmonary emboli and right heart strain. Patient is a candidate for ultrasound assisted pulmonary emboli thrombolysis. EXAM: PULMONARY ARTERIOGRAPHY WITH SELECTIVE ANGIOGRAPHY OF BILATERAL PULMONARY ARTERIES PLACEMENT OF BILATERAL EKOS INFUSION CATHETERS IN PULMONARY ARTERIES ULTRASOUND GUIDANCE FOR VASCULAR ACCESS X 2 COMPARISON:  Chest CTA 02/28/2017 MEDICATIONS: None. ANESTHESIA/SEDATION: Versed 0.5 mg IV; Fentanyl 25 mcg IV Moderate Sedation Time:  45 minutes The patient was continuously  monitored during the  procedure by the interventional radiology nurse under my direct supervision. FLUOROSCOPY TIME:  Fluoroscopy Time: 11 minutes 24 seconds (45 mGy). CONTRAST:  25 mL [id number] COMPLICATIONS: None immediate. TECHNIQUE: Informed written consent was obtained from the patient after a thorough discussion of the procedural risks, benefits and alternatives. All questions were addressed. Maximal Sterile Barrier Technique was utilized including caps, mask, sterile gowns, sterile gloves, sterile drape, hand hygiene and skin antiseptic. A timeout was performed prior to the initiation of the procedure. Patient was placed supine. Ultrasound demonstrated a large patent right internal jugular vein. The right side of the neck was prepped and draped in sterile fashion. Skin was anesthetized with 1% lidocaine. Using ultrasound guidance, 21 gauge needle was directed into the right internal jugular vein and a micropuncture dilator set was placed. A second micropuncture dilator set was placed with ultrasound guidance just below the other catheter. Subsequently, the micro catheters were upsized to 6 [place] vascular sheaths. C2 catheter and Bentson wire were advanced through the heart and placed in the right pulmonary artery. Right pulmonary artery pressure and angiography performed. Catheter was advanced into a right lower lobe pulmonary artery and a Rosen wire was placed. C2 was again used to navigate the heart and was advanced into the left pulmonary artery. Left pulmonary artery pressure and arteriogram was performed. Catheter was then advanced into a left lower lobe pulmonary artery using a Glidewire. A second Rosen wire was placed. A 12 cm infusion length EKOS catheter was advanced over the Rosen wire and positioned in the left pulmonary artery. Small amount of contrast was injected in the lower lobe pulmonary artery to confirm adequate positioning. A second 12 cm EKOS catheter was advanced over the other Rosen wire and advanced into  the right lower lobe pulmonary artery. Again, a small amount of contrast was injected to confirm adequate placement. Both sheaths were sutured to the skin. The infusion catheters were secured to the sheaths. Patient was transferred to the ICU and the tPA infusion was started through both catheters. Fluoroscopic and ultrasound images were taken and saved for documentation. FINDINGS: Right pulmonary artery pressure:  51/25, mean of 35 Left pulmonary artery pressure:  48/21, mean of 33 Infusion catheters placed in the bilateral lower lobe pulmonary arteries. IMPRESSION: Successful placement of bilateral pulmonary artery infusion EKOS catheters. Plan for ultrasound assisted pulmonary embolism thrombolysis through bilateral catheters. Plan for 1 mg of tPA through each catheter for 12 hours, total dose of 24 mg. Plan to recheck pulmonary artery pressures following the 12 hour infusion. Elevated pulmonary artery pressures as described. Electronically Signed   By: Markus Daft M.D.   On: 03/01/2017 08:22   Ir Angiogram Selective Each Additional Vessel  Result Date: 03/01/2017 INDICATION: 83 year old with large bilateral pulmonary emboli and right heart strain. Patient is a candidate for ultrasound assisted pulmonary emboli thrombolysis. EXAM: PULMONARY ARTERIOGRAPHY WITH SELECTIVE ANGIOGRAPHY OF BILATERAL PULMONARY ARTERIES PLACEMENT OF BILATERAL EKOS INFUSION CATHETERS IN PULMONARY ARTERIES ULTRASOUND GUIDANCE FOR VASCULAR ACCESS X 2 COMPARISON:  Chest CTA 02/28/2017 MEDICATIONS: None. ANESTHESIA/SEDATION: Versed 0.5 mg IV; Fentanyl 25 mcg IV Moderate Sedation Time:  45 minutes The patient was continuously monitored during the procedure by the interventional radiology nurse under my direct supervision. FLUOROSCOPY TIME:  Fluoroscopy Time: 11 minutes 24 seconds (45 mGy). CONTRAST:  25 mL [id number] COMPLICATIONS: None immediate. TECHNIQUE: Informed written consent was obtained from the patient after a thorough discussion  of the procedural risks, benefits and alternatives. All questions were  addressed. Maximal Sterile Barrier Technique was utilized including caps, mask, sterile gowns, sterile gloves, sterile drape, hand hygiene and skin antiseptic. A timeout was performed prior to the initiation of the procedure. Patient was placed supine. Ultrasound demonstrated a large patent right internal jugular vein. The right side of the neck was prepped and draped in sterile fashion. Skin was anesthetized with 1% lidocaine. Using ultrasound guidance, 21 gauge needle was directed into the right internal jugular vein and a micropuncture dilator set was placed. A second micropuncture dilator set was placed with ultrasound guidance just below the other catheter. Subsequently, the micro catheters were upsized to 6 [place] vascular sheaths. C2 catheter and Bentson wire were advanced through the heart and placed in the right pulmonary artery. Right pulmonary artery pressure and angiography performed. Catheter was advanced into a right lower lobe pulmonary artery and a Rosen wire was placed. C2 was again used to navigate the heart and was advanced into the left pulmonary artery. Left pulmonary artery pressure and arteriogram was performed. Catheter was then advanced into a left lower lobe pulmonary artery using a Glidewire. A second Rosen wire was placed. A 12 cm infusion length EKOS catheter was advanced over the Rosen wire and positioned in the left pulmonary artery. Small amount of contrast was injected in the lower lobe pulmonary artery to confirm adequate positioning. A second 12 cm EKOS catheter was advanced over the other Rosen wire and advanced into the right lower lobe pulmonary artery. Again, a small amount of contrast was injected to confirm adequate placement. Both sheaths were sutured to the skin. The infusion catheters were secured to the sheaths. Patient was transferred to the ICU and the tPA infusion was started through both catheters.  Fluoroscopic and ultrasound images were taken and saved for documentation. FINDINGS: Right pulmonary artery pressure:  51/25, mean of 35 Left pulmonary artery pressure:  48/21, mean of 33 Infusion catheters placed in the bilateral lower lobe pulmonary arteries. IMPRESSION: Successful placement of bilateral pulmonary artery infusion EKOS catheters. Plan for ultrasound assisted pulmonary embolism thrombolysis through bilateral catheters. Plan for 1 mg of tPA through each catheter for 12 hours, total dose of 24 mg. Plan to recheck pulmonary artery pressures following the 12 hour infusion. Elevated pulmonary artery pressures as described. Electronically Signed   By: Markus Daft M.D.   On: 03/01/2017 08:22   Ir US Guide Vasc Access Right  Result Date: 03/01/2017 INDICATION: 83 year old with large bilateral pulmonary emboli and right heart strain. Patient is a candidate for ultrasound assisted pulmonary emboli thrombolysis. EXAM: PULMONARY ARTERIOGRAPHY WITH SELECTIVE ANGIOGRAPHY OF BILATERAL PULMONARY ARTERIES PLACEMENT OF BILATERAL EKOS INFUSION CATHETERS IN PULMONARY ARTERIES ULTRASOUND GUIDANCE FOR VASCULAR ACCESS X 2 COMPARISON:  Chest CTA 02/28/2017 MEDICATIONS: None. ANESTHESIA/SEDATION: Versed 0.5 mg IV; Fentanyl 25 mcg IV Moderate Sedation Time:  45 minutes The patient was continuously monitored during the procedure by the interventional radiology nurse under my direct supervision. FLUOROSCOPY TIME:  Fluoroscopy Time: 11 minutes 24 seconds (45 mGy). CONTRAST:  25 mL [id number] COMPLICATIONS: None immediate. TECHNIQUE: Informed written consent was obtained from the patient after a thorough discussion of the procedural risks, benefits and alternatives. All questions were addressed. Maximal Sterile Barrier Technique was utilized including caps, mask, sterile gowns, sterile gloves, sterile drape, hand hygiene and skin antiseptic. A timeout was performed prior to the initiation of the procedure. Patient was placed  supine. Ultrasound demonstrated a large patent right internal jugular vein. The right side of the neck was prepped and draped in  sterile fashion. Skin was anesthetized with 1% lidocaine. Using ultrasound guidance, 21 gauge needle was directed into the right internal jugular vein and a micropuncture dilator set was placed. A second micropuncture dilator set was placed with ultrasound guidance just below the other catheter. Subsequently, the micro catheters were upsized to 6 [place] vascular sheaths. C2 catheter and Bentson wire were advanced through the heart and placed in the right pulmonary artery. Right pulmonary artery pressure and angiography performed. Catheter was advanced into a right lower lobe pulmonary artery and a Rosen wire was placed. C2 was again used to navigate the heart and was advanced into the left pulmonary artery. Left pulmonary artery pressure and arteriogram was performed. Catheter was then advanced into a left lower lobe pulmonary artery using a Glidewire. A second Rosen wire was placed. A 12 cm infusion length EKOS catheter was advanced over the Rosen wire and positioned in the left pulmonary artery. Small amount of contrast was injected in the lower lobe pulmonary artery to confirm adequate positioning. A second 12 cm EKOS catheter was advanced over the other Rosen wire and advanced into the right lower lobe pulmonary artery. Again, a small amount of contrast was injected to confirm adequate placement. Both sheaths were sutured to the skin. The infusion catheters were secured to the sheaths. Patient was transferred to the ICU and the tPA infusion was started through both catheters. Fluoroscopic and ultrasound images were taken and saved for documentation. FINDINGS: Right pulmonary artery pressure:  51/25, mean of 35 Left pulmonary artery pressure:  48/21, mean of 33 Infusion catheters placed in the bilateral lower lobe pulmonary arteries. IMPRESSION: Successful placement of bilateral  pulmonary artery infusion EKOS catheters. Plan for ultrasound assisted pulmonary embolism thrombolysis through bilateral catheters. Plan for 1 mg of tPA through each catheter for 12 hours, total dose of 24 mg. Plan to recheck pulmonary artery pressures following the 12 hour infusion. Elevated pulmonary artery pressures as described. Electronically Signed   By: Markus Daft M.D.   On: 03/01/2017 08:22   Ir Infusion Thrombol Arterial Initial (ms)  Result Date: 03/01/2017 INDICATION: 83 year old with large bilateral pulmonary emboli and right heart strain. Patient is a candidate for ultrasound assisted pulmonary emboli thrombolysis. EXAM: PULMONARY ARTERIOGRAPHY WITH SELECTIVE ANGIOGRAPHY OF BILATERAL PULMONARY ARTERIES PLACEMENT OF BILATERAL EKOS INFUSION CATHETERS IN PULMONARY ARTERIES ULTRASOUND GUIDANCE FOR VASCULAR ACCESS X 2 COMPARISON:  Chest CTA 02/28/2017 MEDICATIONS: None. ANESTHESIA/SEDATION: Versed 0.5 mg IV; Fentanyl 25 mcg IV Moderate Sedation Time:  45 minutes The patient was continuously monitored during the procedure by the interventional radiology nurse under my direct supervision. FLUOROSCOPY TIME:  Fluoroscopy Time: 11 minutes 24 seconds (45 mGy). CONTRAST:  25 mL [id number] COMPLICATIONS: None immediate. TECHNIQUE: Informed written consent was obtained from the patient after a thorough discussion of the procedural risks, benefits and alternatives. All questions were addressed. Maximal Sterile Barrier Technique was utilized including caps, mask, sterile gowns, sterile gloves, sterile drape, hand hygiene and skin antiseptic. A timeout was performed prior to the initiation of the procedure. Patient was placed supine. Ultrasound demonstrated a large patent right internal jugular vein. The right side of the neck was prepped and draped in sterile fashion. Skin was anesthetized with 1% lidocaine. Using ultrasound guidance, 21 gauge needle was directed into the right internal jugular vein and a  micropuncture dilator set was placed. A second micropuncture dilator set was placed with ultrasound guidance just below the other catheter. Subsequently, the micro catheters were upsized to 6 [place] vascular sheaths. C2  catheter and Bentson wire were advanced through the heart and placed in the right pulmonary artery. Right pulmonary artery pressure and angiography performed. Catheter was advanced into a right lower lobe pulmonary artery and a Rosen wire was placed. C2 was again used to navigate the heart and was advanced into the left pulmonary artery. Left pulmonary artery pressure and arteriogram was performed. Catheter was then advanced into a left lower lobe pulmonary artery using a Glidewire. A second Rosen wire was placed. A 12 cm infusion length EKOS catheter was advanced over the Rosen wire and positioned in the left pulmonary artery. Small amount of contrast was injected in the lower lobe pulmonary artery to confirm adequate positioning. A second 12 cm EKOS catheter was advanced over the other Rosen wire and advanced into the right lower lobe pulmonary artery. Again, a small amount of contrast was injected to confirm adequate placement. Both sheaths were sutured to the skin. The infusion catheters were secured to the sheaths. Patient was transferred to the ICU and the tPA infusion was started through both catheters. Fluoroscopic and ultrasound images were taken and saved for documentation. FINDINGS: Right pulmonary artery pressure:  51/25, mean of 35 Left pulmonary artery pressure:  48/21, mean of 33 Infusion catheters placed in the bilateral lower lobe pulmonary arteries. IMPRESSION: Successful placement of bilateral pulmonary artery infusion EKOS catheters. Plan for ultrasound assisted pulmonary embolism thrombolysis through bilateral catheters. Plan for 1 mg of tPA through each catheter for 12 hours, total dose of 24 mg. Plan to recheck pulmonary artery pressures following the 12 hour infusion. Elevated  pulmonary artery pressures as described. Electronically Signed   By: Markus Daft M.D.   On: 03/01/2017 08:22   Ir Infusion Thrombol Arterial Initial (ms)  Result Date: 03/01/2017 INDICATION: 83 year old with large bilateral pulmonary emboli and right heart strain. Patient is a candidate for ultrasound assisted pulmonary emboli thrombolysis. EXAM: PULMONARY ARTERIOGRAPHY WITH SELECTIVE ANGIOGRAPHY OF BILATERAL PULMONARY ARTERIES PLACEMENT OF BILATERAL EKOS INFUSION CATHETERS IN PULMONARY ARTERIES ULTRASOUND GUIDANCE FOR VASCULAR ACCESS X 2 COMPARISON:  Chest CTA 02/28/2017 MEDICATIONS: None. ANESTHESIA/SEDATION: Versed 0.5 mg IV; Fentanyl 25 mcg IV Moderate Sedation Time:  45 minutes The patient was continuously monitored during the procedure by the interventional radiology nurse under my direct supervision. FLUOROSCOPY TIME:  Fluoroscopy Time: 11 minutes 24 seconds (45 mGy). CONTRAST:  25 mL [id number] COMPLICATIONS: None immediate. TECHNIQUE: Informed written consent was obtained from the patient after a thorough discussion of the procedural risks, benefits and alternatives. All questions were addressed. Maximal Sterile Barrier Technique was utilized including caps, mask, sterile gowns, sterile gloves, sterile drape, hand hygiene and skin antiseptic. A timeout was performed prior to the initiation of the procedure. Patient was placed supine. Ultrasound demonstrated a large patent right internal jugular vein. The right side of the neck was prepped and draped in sterile fashion. Skin was anesthetized with 1% lidocaine. Using ultrasound guidance, 21 gauge needle was directed into the right internal jugular vein and a micropuncture dilator set was placed. A second micropuncture dilator set was placed with ultrasound guidance just below the other catheter. Subsequently, the micro catheters were upsized to 6 [place] vascular sheaths. C2 catheter and Bentson wire were advanced through the heart and placed in the right  pulmonary artery. Right pulmonary artery pressure and angiography performed. Catheter was advanced into a right lower lobe pulmonary artery and a Rosen wire was placed. C2 was again used to navigate the heart and was advanced into the left pulmonary artery. Left  pulmonary artery pressure and arteriogram was performed. Catheter was then advanced into a left lower lobe pulmonary artery using a Glidewire. A second Rosen wire was placed. A 12 cm infusion length EKOS catheter was advanced over the Rosen wire and positioned in the left pulmonary artery. Small amount of contrast was injected in the lower lobe pulmonary artery to confirm adequate positioning. A second 12 cm EKOS catheter was advanced over the other Rosen wire and advanced into the right lower lobe pulmonary artery. Again, a small amount of contrast was injected to confirm adequate placement. Both sheaths were sutured to the skin. The infusion catheters were secured to the sheaths. Patient was transferred to the ICU and the tPA infusion was started through both catheters. Fluoroscopic and ultrasound images were taken and saved for documentation. FINDINGS: Right pulmonary artery pressure:  51/25, mean of 35 Left pulmonary artery pressure:  48/21, mean of 33 Infusion catheters placed in the bilateral lower lobe pulmonary arteries. IMPRESSION: Successful placement of bilateral pulmonary artery infusion EKOS catheters. Plan for ultrasound assisted pulmonary embolism thrombolysis through bilateral catheters. Plan for 1 mg of tPA through each catheter for 12 hours, total dose of 24 mg. Plan to recheck pulmonary artery pressures following the 12 hour infusion. Elevated pulmonary artery pressures as described. Electronically Signed   By: Markus Daft M.D.   On: 03/01/2017 08:22   Ir Jacolyn Reedy F/u Eval Art/ven Final Day (ms)  Result Date: 03/01/2017 INDICATION: 83 year old female with large bilateral pulmonary emboli and recently completed a 12 hour infusion of tPA  through bilateral EKOS catheters. Total dose of 24 mg of tPA was given. Patient says that her breathing has improved. No signs of bleeding. EXAM: FOLLOW-UP THROMBOLYTIC THERAPY- FINAL DAY COMPARISON:  None. MEDICATIONS: None. ANESTHESIA/SEDATION: None FLUOROSCOPY TIME:  None COMPLICATIONS: None immediate. TECHNIQUE: Pulmonary artery pressures were obtained from EKOS catheters. Catheters were slightly pulled back so that the pressures were obtained in the main left and right pulmonary arteries. The infusion catheters were completely removed. The right jugular vascular sheaths were removed with manual compression. Bandage placed over the puncture sites. FINDINGS: The mean pulmonary artery pressure in bilateral pulmonary arteries is 20. Mean pulmonary artery pressures prior to thrombolytic therapy was 33 on the left and 35 on the right. IMPRESSION: Decreased pulmonary artery pressures bilaterally. Completion of the pulmonary emboli thrombolysis. Electronically Signed   By: Markus Daft M.D.   On: 03/01/2017 13:29    ASSESSMENT / PLAN:  Acute Hypoxic Respiratory Distress Secondary to Bilateral Pulmonary Emboli with evidence of right heart strain.  S/p EKOS 3/20. Small segment of DVT in right distal gastrocnemius. Plan -Maintain Oxygenation >92. -Heparin gtt per Pharmacy, transition to coumadin today. -OK to transfer to tele and to [facility] service.  Elevated Troponin (Suspected Demand ischemia). Elevated BNP. H/O HTN. Plan -Cardiac Monitoring.  -Hold home Norvasc, ASA, Lisinopril, Metoprolol in setting of hypotension.  Hypothyroidism.  Plan -Continue preadmission synthroid.   Hyperglycemia  - resolved. Plan -Trend Glucose.  H/O Diverticulitis, GERD. Plan -Advance diet to heart healthy. -PPI.  H/O Anxiety.  Plan -continue preadmission xanax, flexeril, duloxetine, gabapentin.   OK to transfer to tele and will ask TR[facility] to assume care starting in AM 3/23.   Montey Hora, [place]  Pulmonary & Critical Care Medicine Pager: [phone]  or [phone] 03/02/2017, 9:05 AM  Attending Note:  I have examined patient, reviewed labs, studies and notes. I have discussed the case with [place],  and I agree with the data and plans as amended above. 83 year old woman admitted with respiratory distress, right heart strain due to acute bilateral pulmonary emboli. She underwent targeted lytic therapy. Her lower extremity duplex study showed a small right distal DVT. She benefited significantly with increased dyspnea and improved hemodynamics. On my evaluation today she is awake alert and interactive. She has normal breath sounds. Heart rate normal. She continues to have some relative hypotension and her blood pressure medications have not been restarted. She will continue on heparin, transition to oral warfarin prior to discharge. Suspect that we will need to incrementally restart her blood pressure regimen as she stabilizes. She should have a repeat echocardiogram in approximately 4-6 months to assess right heart function.  She will transition out of the ICU today  Baltazar Apo, MD, PhD 03/02/2017, 1:12 PM [facility] [phone] or if no answer [phone]

## 2017-03-02 NOTE — Progress Notes (Signed)
Pt ambulated in hallway with RN. Pt ambulated about 250 feet. While ambulating HR increased to about 150. O2 sats >90%. Pt returned back to bed and HR decreased back to 100-110's. Pt tolerated well.

## 2017-03-02 NOTE — Progress Notes (Signed)
Patient ID: Jacqueline Tate, female   DOB: Aug 14, 1931, 83 y.o.   MRN: [id number]    Referring Physician(s): Dr. Merton Border  Supervising Physician: Corrie Mckusick  Patient Status: [facility] - In-pt  Chief Complaint: Submassive pulmonary emboli  Subjective: Patient sitting up in her chair with no complaints.  She's on RA and sating well in the 90s.  Allergies: Codeine; Latex; Penicillins; and Sulfa antibiotics  Medications: Prior to Admission medications   Medication Sig Start Date End Date Taking? Authorizing Provider  ALPRAZolam (XANAX) 0.25 MG tablet Take 0.25-0.5 mg by mouth at bedtime as needed for anxiety.    Historical Provider, MD  amLODipine (NORVASC) 5 MG tablet Take 1 tablet by mouth daily. 06/15/16   Historical Provider, MD  aspirin 81 MG tablet Take 81 mg by mouth daily.    Historical Provider, MD  bisacodyl (DULCOLAX) 5 MG EC tablet Take 5 mg by mouth daily as needed for moderate constipation.    Historical Provider, MD  cyclobenzaprine (FLEXERIL) 5 MG tablet Take 5 mg by mouth at bedtime.    Historical Provider, MD  docusate sodium (COLACE) 100 MG capsule Take 100 mg by mouth daily.    Historical Provider, MD  DULoxetine (CYMBALTA) 60 MG capsule Take 1 capsule by mouth daily. 05/27/16   Historical Provider, MD  fluticasone (FLONASE) 50 MCG/ACT nasal spray Place 1 spray into both nostrils daily as needed. 06/17/16   Historical Provider, MD  gabapentin (NEURONTIN) 600 MG tablet Take 1 tablet by mouth 3 (three) times daily. 05/27/16   Historical Provider, MD  levothyroxine (SYNTHROID, LEVOTHROID) 50 MCG tablet Take 1 tablet by mouth daily. 05/27/16   Historical Provider, MD  lisinopril-hydrochlorothiazide (PRINZIDE,ZESTORETIC) 20-12.5 MG tablet Take 1 tablet by mouth daily. 05/13/16   Historical Provider, MD  loratadine (CLARITIN) 10 MG tablet Take 10 mg by mouth daily.    Historical Provider, MD  meloxicam (MOBIC) 7.5 MG tablet Take 1 tablet by mouth 2 (two) times daily. 05/27/16    Historical Provider, MD  metoCLOPramide (REGLAN) 10 MG tablet Take 5 mg by mouth daily. 05/27/16   Historical Provider, MD  metoprolol (LOPRESSOR) 50 MG tablet Take 1 tablet by mouth 2 (two) times daily. 05/27/16   Historical Provider, MD  omeprazole (PRILOSEC) 20 MG capsule Take 20 mg by mouth 2 (two) times daily.     Historical Provider, MD  Probiotic Product ([place]) Take 1 capsule by mouth daily.    Historical Provider, MD  traZODone (DESYREL) 50 MG tablet Take 50 mg by mouth at bedtime.  04/19/16   Historical Provider, MD    Vital Signs: BP 130/66   Pulse (!) 125   Temp 97.6 F (36.4 C) (Oral)   Resp (!) 23   Ht 5\' 5"  (1.651 m)   Wt 157 lb 10.1 oz (71.5 kg)   SpO2 95%   BMI 26.23 kg/m   Physical Exam: Neck: right IJ stick site is covered with a bandage.  No bleeding and not evidence of hematoma.  Imaging: Ct Angio Chest Pe W And/or Wo Contrast  Result Date: 02/28/2017 CLINICAL DATA:  83 year old female with shortness of breath. Initial encounter. EXAM: CT ANGIOGRAPHY CHEST WITH CONTRAST TECHNIQUE: Multidetector CT imaging of the chest was performed using the standard protocol during bolus administration of intravenous contrast. Multiplanar CT image reconstructions and MIPs were obtained to evaluate the vascular anatomy. CONTRAST:  75 cc Isovue 370. COMPARISON:  None. FINDINGS: Cardiovascular: Large bilateral pulmonary emboli with evidence of  right heart strain. Coronary artery calcifications. Atherosclerotic changes thoracic aorta. Exam not optimized to evaluate the aorta although no obvious dissection. Mediastinum/Nodes: Moderately large hiatal hernia. Lungs/Pleura: Hazy parenchymal changes peripheral aspect right lower lobe possibly chronic or related to pulmonary embolus. Scarring/subsegmental atelectasis left lower lobe/lingula. Upper Abdomen: Prominent contrast hepatic veins may be related to increased right heart pressure. Musculoskeletal: Degenerative changes lower  thoracic/ upper lumbar spine and lower cervical spine. Review of the MIP images confirms the above findings. IMPRESSION: Positive for acute large bilateral pulmonary emboli with CT evidence of right heart strain (RV/LV Ratio = 1.6) consistent with at least submassive (intermediate risk) PE. The presence of right heart strain has been associated with an increased risk of morbidity and mortality. Please activate Code PE by paging [phone]. Aortic atherosclerosis. Moderately large hiatal hernia. Hazy parenchymal changes peripheral aspect right lower lobe possibly chronic or related to pulmonary embolus. Scarring/subsegmental atelectasis left lower lobe/lingula. These results were called by telephone at the time of interpretation on 02/28/2017 at 3:14 pm to Dr. Rudene Re , who verbally acknowledged these results. Electronically Signed   By: Genia Del M.D.   On: 02/28/2017 15:24   Ir Angiogram Pulmonary Bilateral Selective  Result Date: 03/01/2017 INDICATION: 83 year old with large bilateral pulmonary emboli and right heart strain. Patient is a candidate for ultrasound assisted pulmonary emboli thrombolysis. EXAM: PULMONARY ARTERIOGRAPHY WITH SELECTIVE ANGIOGRAPHY OF BILATERAL PULMONARY ARTERIES PLACEMENT OF BILATERAL EKOS INFUSION CATHETERS IN PULMONARY ARTERIES ULTRASOUND GUIDANCE FOR VASCULAR ACCESS X 2 COMPARISON:  Chest CTA 02/28/2017 MEDICATIONS: None. ANESTHESIA/SEDATION: Versed 0.5 mg IV; Fentanyl 25 mcg IV Moderate Sedation Time:  45 minutes The patient was continuously monitored during the procedure by the interventional radiology nurse under my direct supervision. FLUOROSCOPY TIME:  Fluoroscopy Time: 11 minutes 24 seconds (45 mGy). CONTRAST:  25 mL [id number] COMPLICATIONS: None immediate. TECHNIQUE: Informed written consent was obtained from the patient after a thorough discussion of the procedural risks, benefits and alternatives. All questions were addressed. Maximal Sterile Barrier  Technique was utilized including caps, mask, sterile gowns, sterile gloves, sterile drape, hand hygiene and skin antiseptic. A timeout was performed prior to the initiation of the procedure. Patient was placed supine. Ultrasound demonstrated a large patent right internal jugular vein. The right side of the neck was prepped and draped in sterile fashion. Skin was anesthetized with 1% lidocaine. Using ultrasound guidance, 21 gauge needle was directed into the right internal jugular vein and a micropuncture dilator set was placed. A second micropuncture dilator set was placed with ultrasound guidance just below the other catheter. Subsequently, the micro catheters were upsized to 6 [place] vascular sheaths. C2 catheter and Bentson wire were advanced through the heart and placed in the right pulmonary artery. Right pulmonary artery pressure and angiography performed. Catheter was advanced into a right lower lobe pulmonary artery and a Rosen wire was placed. C2 was again used to navigate the heart and was advanced into the left pulmonary artery. Left pulmonary artery pressure and arteriogram was performed. Catheter was then advanced into a left lower lobe pulmonary artery using a Glidewire. A second Rosen wire was placed. A 12 cm infusion length EKOS catheter was advanced over the Rosen wire and positioned in the left pulmonary artery. Small amount of contrast was injected in the lower lobe pulmonary artery to confirm adequate positioning. A second 12 cm EKOS catheter was advanced over the other Rosen wire and advanced into the right lower lobe pulmonary artery. Again, a small amount of contrast was  injected to confirm adequate placement. Both sheaths were sutured to the skin. The infusion catheters were secured to the sheaths. Patient was transferred to the ICU and the tPA infusion was started through both catheters. Fluoroscopic and ultrasound images were taken and saved for documentation. FINDINGS: Right pulmonary  artery pressure:  51/25, mean of 35 Left pulmonary artery pressure:  48/21, mean of 33 Infusion catheters placed in the bilateral lower lobe pulmonary arteries. IMPRESSION: Successful placement of bilateral pulmonary artery infusion EKOS catheters. Plan for ultrasound assisted pulmonary embolism thrombolysis through bilateral catheters. Plan for 1 mg of tPA through each catheter for 12 hours, total dose of 24 mg. Plan to recheck pulmonary artery pressures following the 12 hour infusion. Elevated pulmonary artery pressures as described. Electronically Signed   By: Markus Daft M.D.   On: 03/01/2017 08:22   Ir Angiogram Selective Each Additional Vessel  Result Date: 03/01/2017 INDICATION: 83 year old with large bilateral pulmonary emboli and right heart strain. Patient is a candidate for ultrasound assisted pulmonary emboli thrombolysis. EXAM: PULMONARY ARTERIOGRAPHY WITH SELECTIVE ANGIOGRAPHY OF BILATERAL PULMONARY ARTERIES PLACEMENT OF BILATERAL EKOS INFUSION CATHETERS IN PULMONARY ARTERIES ULTRASOUND GUIDANCE FOR VASCULAR ACCESS X 2 COMPARISON:  Chest CTA 02/28/2017 MEDICATIONS: None. ANESTHESIA/SEDATION: Versed 0.5 mg IV; Fentanyl 25 mcg IV Moderate Sedation Time:  45 minutes The patient was continuously monitored during the procedure by the interventional radiology nurse under my direct supervision. FLUOROSCOPY TIME:  Fluoroscopy Time: 11 minutes 24 seconds (45 mGy). CONTRAST:  25 mL [id number] COMPLICATIONS: None immediate. TECHNIQUE: Informed written consent was obtained from the patient after a thorough discussion of the procedural risks, benefits and alternatives. All questions were addressed. Maximal Sterile Barrier Technique was utilized including caps, mask, sterile gowns, sterile gloves, sterile drape, hand hygiene and skin antiseptic. A timeout was performed prior to the initiation of the procedure. Patient was placed supine. Ultrasound demonstrated a large patent right internal jugular vein. The right  side of the neck was prepped and draped in sterile fashion. Skin was anesthetized with 1% lidocaine. Using ultrasound guidance, 21 gauge needle was directed into the right internal jugular vein and a micropuncture dilator set was placed. A second micropuncture dilator set was placed with ultrasound guidance just below the other catheter. Subsequently, the micro catheters were upsized to 6 [place] vascular sheaths. C2 catheter and Bentson wire were advanced through the heart and placed in the right pulmonary artery. Right pulmonary artery pressure and angiography performed. Catheter was advanced into a right lower lobe pulmonary artery and a Rosen wire was placed. C2 was again used to navigate the heart and was advanced into the left pulmonary artery. Left pulmonary artery pressure and arteriogram was performed. Catheter was then advanced into a left lower lobe pulmonary artery using a Glidewire. A second Rosen wire was placed. A 12 cm infusion length EKOS catheter was advanced over the Rosen wire and positioned in the left pulmonary artery. Small amount of contrast was injected in the lower lobe pulmonary artery to confirm adequate positioning. A second 12 cm EKOS catheter was advanced over the other Rosen wire and advanced into the right lower lobe pulmonary artery. Again, a small amount of contrast was injected to confirm adequate placement. Both sheaths were sutured to the skin. The infusion catheters were secured to the sheaths. Patient was transferred to the ICU and the tPA infusion was started through both catheters. Fluoroscopic and ultrasound images were taken and saved for documentation. FINDINGS: Right pulmonary artery pressure:  51/25, mean of 35 Left  pulmonary artery pressure:  48/21, mean of 33 Infusion catheters placed in the bilateral lower lobe pulmonary arteries. IMPRESSION: Successful placement of bilateral pulmonary artery infusion EKOS catheters. Plan for ultrasound assisted pulmonary embolism  thrombolysis through bilateral catheters. Plan for 1 mg of tPA through each catheter for 12 hours, total dose of 24 mg. Plan to recheck pulmonary artery pressures following the 12 hour infusion. Elevated pulmonary artery pressures as described. Electronically Signed   By: Markus Daft M.D.   On: 03/01/2017 08:22   Ir Angiogram Selective Each Additional Vessel  Result Date: 03/01/2017 INDICATION: 83 year old with large bilateral pulmonary emboli and right heart strain. Patient is a candidate for ultrasound assisted pulmonary emboli thrombolysis. EXAM: PULMONARY ARTERIOGRAPHY WITH SELECTIVE ANGIOGRAPHY OF BILATERAL PULMONARY ARTERIES PLACEMENT OF BILATERAL EKOS INFUSION CATHETERS IN PULMONARY ARTERIES ULTRASOUND GUIDANCE FOR VASCULAR ACCESS X 2 COMPARISON:  Chest CTA 02/28/2017 MEDICATIONS: None. ANESTHESIA/SEDATION: Versed 0.5 mg IV; Fentanyl 25 mcg IV Moderate Sedation Time:  45 minutes The patient was continuously monitored during the procedure by the interventional radiology nurse under my direct supervision. FLUOROSCOPY TIME:  Fluoroscopy Time: 11 minutes 24 seconds (45 mGy). CONTRAST:  25 mL [id number] COMPLICATIONS: None immediate. TECHNIQUE: Informed written consent was obtained from the patient after a thorough discussion of the procedural risks, benefits and alternatives. All questions were addressed. Maximal Sterile Barrier Technique was utilized including caps, mask, sterile gowns, sterile gloves, sterile drape, hand hygiene and skin antiseptic. A timeout was performed prior to the initiation of the procedure. Patient was placed supine. Ultrasound demonstrated a large patent right internal jugular vein. The right side of the neck was prepped and draped in sterile fashion. Skin was anesthetized with 1% lidocaine. Using ultrasound guidance, 21 gauge needle was directed into the right internal jugular vein and a micropuncture dilator set was placed. A second micropuncture dilator set was placed with  ultrasound guidance just below the other catheter. Subsequently, the micro catheters were upsized to 6 [place] vascular sheaths. C2 catheter and Bentson wire were advanced through the heart and placed in the right pulmonary artery. Right pulmonary artery pressure and angiography performed. Catheter was advanced into a right lower lobe pulmonary artery and a Rosen wire was placed. C2 was again used to navigate the heart and was advanced into the left pulmonary artery. Left pulmonary artery pressure and arteriogram was performed. Catheter was then advanced into a left lower lobe pulmonary artery using a Glidewire. A second Rosen wire was placed. A 12 cm infusion length EKOS catheter was advanced over the Rosen wire and positioned in the left pulmonary artery. Small amount of contrast was injected in the lower lobe pulmonary artery to confirm adequate positioning. A second 12 cm EKOS catheter was advanced over the other Rosen wire and advanced into the right lower lobe pulmonary artery. Again, a small amount of contrast was injected to confirm adequate placement. Both sheaths were sutured to the skin. The infusion catheters were secured to the sheaths. Patient was transferred to the ICU and the tPA infusion was started through both catheters. Fluoroscopic and ultrasound images were taken and saved for documentation. FINDINGS: Right pulmonary artery pressure:  51/25, mean of 35 Left pulmonary artery pressure:  48/21, mean of 33 Infusion catheters placed in the bilateral lower lobe pulmonary arteries. IMPRESSION: Successful placement of bilateral pulmonary artery infusion EKOS catheters. Plan for ultrasound assisted pulmonary embolism thrombolysis through bilateral catheters. Plan for 1 mg of tPA through each catheter for 12 hours, total dose of 24 mg. Plan  to recheck pulmonary artery pressures following the 12 hour infusion. Elevated pulmonary artery pressures as described. Electronically Signed   By: Markus Daft M.D.    On: 03/01/2017 08:22   Ir US Guide Vasc Access Right  Result Date: 03/01/2017 INDICATION: 83 year old with large bilateral pulmonary emboli and right heart strain. Patient is a candidate for ultrasound assisted pulmonary emboli thrombolysis. EXAM: PULMONARY ARTERIOGRAPHY WITH SELECTIVE ANGIOGRAPHY OF BILATERAL PULMONARY ARTERIES PLACEMENT OF BILATERAL EKOS INFUSION CATHETERS IN PULMONARY ARTERIES ULTRASOUND GUIDANCE FOR VASCULAR ACCESS X 2 COMPARISON:  Chest CTA 02/28/2017 MEDICATIONS: None. ANESTHESIA/SEDATION: Versed 0.5 mg IV; Fentanyl 25 mcg IV Moderate Sedation Time:  45 minutes The patient was continuously monitored during the procedure by the interventional radiology nurse under my direct supervision. FLUOROSCOPY TIME:  Fluoroscopy Time: 11 minutes 24 seconds (45 mGy). CONTRAST:  25 mL [id number] COMPLICATIONS: None immediate. TECHNIQUE: Informed written consent was obtained from the patient after a thorough discussion of the procedural risks, benefits and alternatives. All questions were addressed. Maximal Sterile Barrier Technique was utilized including caps, mask, sterile gowns, sterile gloves, sterile drape, hand hygiene and skin antiseptic. A timeout was performed prior to the initiation of the procedure. Patient was placed supine. Ultrasound demonstrated a large patent right internal jugular vein. The right side of the neck was prepped and draped in sterile fashion. Skin was anesthetized with 1% lidocaine. Using ultrasound guidance, 21 gauge needle was directed into the right internal jugular vein and a micropuncture dilator set was placed. A second micropuncture dilator set was placed with ultrasound guidance just below the other catheter. Subsequently, the micro catheters were upsized to 6 [place] vascular sheaths. C2 catheter and Bentson wire were advanced through the heart and placed in the right pulmonary artery. Right pulmonary artery pressure and angiography performed. Catheter was advanced  into a right lower lobe pulmonary artery and a Rosen wire was placed. C2 was again used to navigate the heart and was advanced into the left pulmonary artery. Left pulmonary artery pressure and arteriogram was performed. Catheter was then advanced into a left lower lobe pulmonary artery using a Glidewire. A second Rosen wire was placed. A 12 cm infusion length EKOS catheter was advanced over the Rosen wire and positioned in the left pulmonary artery. Small amount of contrast was injected in the lower lobe pulmonary artery to confirm adequate positioning. A second 12 cm EKOS catheter was advanced over the other Rosen wire and advanced into the right lower lobe pulmonary artery. Again, a small amount of contrast was injected to confirm adequate placement. Both sheaths were sutured to the skin. The infusion catheters were secured to the sheaths. Patient was transferred to the ICU and the tPA infusion was started through both catheters. Fluoroscopic and ultrasound images were taken and saved for documentation. FINDINGS: Right pulmonary artery pressure:  51/25, mean of 35 Left pulmonary artery pressure:  48/21, mean of 33 Infusion catheters placed in the bilateral lower lobe pulmonary arteries. IMPRESSION: Successful placement of bilateral pulmonary artery infusion EKOS catheters. Plan for ultrasound assisted pulmonary embolism thrombolysis through bilateral catheters. Plan for 1 mg of tPA through each catheter for 12 hours, total dose of 24 mg. Plan to recheck pulmonary artery pressures following the 12 hour infusion. Elevated pulmonary artery pressures as described. Electronically Signed   By: Markus Daft M.D.   On: 03/01/2017 08:22   Ir Infusion Thrombol Arterial Initial (ms)  Result Date: 03/01/2017 INDICATION: 83 year old with large bilateral pulmonary emboli and right heart strain. Patient is a  candidate for ultrasound assisted pulmonary emboli thrombolysis. EXAM: PULMONARY ARTERIOGRAPHY WITH SELECTIVE  ANGIOGRAPHY OF BILATERAL PULMONARY ARTERIES PLACEMENT OF BILATERAL EKOS INFUSION CATHETERS IN PULMONARY ARTERIES ULTRASOUND GUIDANCE FOR VASCULAR ACCESS X 2 COMPARISON:  Chest CTA 02/28/2017 MEDICATIONS: None. ANESTHESIA/SEDATION: Versed 0.5 mg IV; Fentanyl 25 mcg IV Moderate Sedation Time:  45 minutes The patient was continuously monitored during the procedure by the interventional radiology nurse under my direct supervision. FLUOROSCOPY TIME:  Fluoroscopy Time: 11 minutes 24 seconds (45 mGy). CONTRAST:  25 mL [id number] COMPLICATIONS: None immediate. TECHNIQUE: Informed written consent was obtained from the patient after a thorough discussion of the procedural risks, benefits and alternatives. All questions were addressed. Maximal Sterile Barrier Technique was utilized including caps, mask, sterile gowns, sterile gloves, sterile drape, hand hygiene and skin antiseptic. A timeout was performed prior to the initiation of the procedure. Patient was placed supine. Ultrasound demonstrated a large patent right internal jugular vein. The right side of the neck was prepped and draped in sterile fashion. Skin was anesthetized with 1% lidocaine. Using ultrasound guidance, 21 gauge needle was directed into the right internal jugular vein and a micropuncture dilator set was placed. A second micropuncture dilator set was placed with ultrasound guidance just below the other catheter. Subsequently, the micro catheters were upsized to 6 [place] vascular sheaths. C2 catheter and Bentson wire were advanced through the heart and placed in the right pulmonary artery. Right pulmonary artery pressure and angiography performed. Catheter was advanced into a right lower lobe pulmonary artery and a Rosen wire was placed. C2 was again used to navigate the heart and was advanced into the left pulmonary artery. Left pulmonary artery pressure and arteriogram was performed. Catheter was then advanced into a left lower lobe pulmonary artery using  a Glidewire. A second Rosen wire was placed. A 12 cm infusion length EKOS catheter was advanced over the Rosen wire and positioned in the left pulmonary artery. Small amount of contrast was injected in the lower lobe pulmonary artery to confirm adequate positioning. A second 12 cm EKOS catheter was advanced over the other Rosen wire and advanced into the right lower lobe pulmonary artery. Again, a small amount of contrast was injected to confirm adequate placement. Both sheaths were sutured to the skin. The infusion catheters were secured to the sheaths. Patient was transferred to the ICU and the tPA infusion was started through both catheters. Fluoroscopic and ultrasound images were taken and saved for documentation. FINDINGS: Right pulmonary artery pressure:  51/25, mean of 35 Left pulmonary artery pressure:  48/21, mean of 33 Infusion catheters placed in the bilateral lower lobe pulmonary arteries. IMPRESSION: Successful placement of bilateral pulmonary artery infusion EKOS catheters. Plan for ultrasound assisted pulmonary embolism thrombolysis through bilateral catheters. Plan for 1 mg of tPA through each catheter for 12 hours, total dose of 24 mg. Plan to recheck pulmonary artery pressures following the 12 hour infusion. Elevated pulmonary artery pressures as described. Electronically Signed   By: Markus Daft M.D.   On: 03/01/2017 08:22   Ir Infusion Thrombol Arterial Initial (ms)  Result Date: 03/01/2017 INDICATION: 84 year old with large bilateral pulmonary emboli and right heart strain. Patient is a candidate for ultrasound assisted pulmonary emboli thrombolysis. EXAM: PULMONARY ARTERIOGRAPHY WITH SELECTIVE ANGIOGRAPHY OF BILATERAL PULMONARY ARTERIES PLACEMENT OF BILATERAL EKOS INFUSION CATHETERS IN PULMONARY ARTERIES ULTRASOUND GUIDANCE FOR VASCULAR ACCESS X 2 COMPARISON:  Chest CTA 02/28/2017 MEDICATIONS: None. ANESTHESIA/SEDATION: Versed 0.5 mg IV; Fentanyl 25 mcg IV Moderate Sedation Time:  45 minutes  The patient was continuously monitored during the procedure by the interventional radiology nurse under my direct supervision. FLUOROSCOPY TIME:  Fluoroscopy Time: 11 minutes 24 seconds (45 mGy). CONTRAST:  25 mL [id number] COMPLICATIONS: None immediate. TECHNIQUE: Informed written consent was obtained from the patient after a thorough discussion of the procedural risks, benefits and alternatives. All questions were addressed. Maximal Sterile Barrier Technique was utilized including caps, mask, sterile gowns, sterile gloves, sterile drape, hand hygiene and skin antiseptic. A timeout was performed prior to the initiation of the procedure. Patient was placed supine. Ultrasound demonstrated a large patent right internal jugular vein. The right side of the neck was prepped and draped in sterile fashion. Skin was anesthetized with 1% lidocaine. Using ultrasound guidance, 21 gauge needle was directed into the right internal jugular vein and a micropuncture dilator set was placed. A second micropuncture dilator set was placed with ultrasound guidance just below the other catheter. Subsequently, the micro catheters were upsized to 6 [place] vascular sheaths. C2 catheter and Bentson wire were advanced through the heart and placed in the right pulmonary artery. Right pulmonary artery pressure and angiography performed. Catheter was advanced into a right lower lobe pulmonary artery and a Rosen wire was placed. C2 was again used to navigate the heart and was advanced into the left pulmonary artery. Left pulmonary artery pressure and arteriogram was performed. Catheter was then advanced into a left lower lobe pulmonary artery using a Glidewire. A second Rosen wire was placed. A 12 cm infusion length EKOS catheter was advanced over the Rosen wire and positioned in the left pulmonary artery. Small amount of contrast was injected in the lower lobe pulmonary artery to confirm adequate positioning. A second 12 cm EKOS catheter was  advanced over the other Rosen wire and advanced into the right lower lobe pulmonary artery. Again, a small amount of contrast was injected to confirm adequate placement. Both sheaths were sutured to the skin. The infusion catheters were secured to the sheaths. Patient was transferred to the ICU and the tPA infusion was started through both catheters. Fluoroscopic and ultrasound images were taken and saved for documentation. FINDINGS: Right pulmonary artery pressure:  51/25, mean of 35 Left pulmonary artery pressure:  48/21, mean of 33 Infusion catheters placed in the bilateral lower lobe pulmonary arteries. IMPRESSION: Successful placement of bilateral pulmonary artery infusion EKOS catheters. Plan for ultrasound assisted pulmonary embolism thrombolysis through bilateral catheters. Plan for 1 mg of tPA through each catheter for 12 hours, total dose of 24 mg. Plan to recheck pulmonary artery pressures following the 12 hour infusion. Elevated pulmonary artery pressures as described. Electronically Signed   By: Markus Daft M.D.   On: 03/01/2017 08:22   Ir Jacolyn Reedy F/u Eval Art/ven Final Day (ms)  Result Date: 03/01/2017 INDICATION: 83 year old female with large bilateral pulmonary emboli and recently completed a 12 hour infusion of tPA through bilateral EKOS catheters. Total dose of 24 mg of tPA was given. Patient says that her breathing has improved. No signs of bleeding. EXAM: FOLLOW-UP THROMBOLYTIC THERAPY- FINAL DAY COMPARISON:  None. MEDICATIONS: None. ANESTHESIA/SEDATION: None FLUOROSCOPY TIME:  None COMPLICATIONS: None immediate. TECHNIQUE: Pulmonary artery pressures were obtained from EKOS catheters. Catheters were slightly pulled back so that the pressures were obtained in the main left and right pulmonary arteries. The infusion catheters were completely removed. The right jugular vascular sheaths were removed with manual compression. Bandage placed over the puncture sites. FINDINGS: The mean pulmonary artery  pressure in bilateral pulmonary arteries is 20.  Mean pulmonary artery pressures prior to thrombolytic therapy was 33 on the left and 35 on the right. IMPRESSION: Decreased pulmonary artery pressures bilaterally. Completion of the pulmonary emboli thrombolysis. Electronically Signed   By: Markus Daft M.D.   On: 03/01/2017 13:29    Labs:  CBC:  Recent Labs  02/28/17 1415 03/01/17 0540 03/01/17 0930 03/02/17 0504  WBC 10.2 6.3 6.9 4.6  HGB 14.2 12.8 12.8 12.0  HCT 41.2 37.4 37.6 36.5  PLT 193 141* 150 115*    COAGS:  Recent Labs  02/28/17 1421  INR 1.11  APTT 31    BMP:  Recent Labs  07/09/16 1304 02/28/17 1415 03/01/17 0026 03/02/17 1028  NA 137 135 139 137  K 3.3* 4.3 3.4* 4.1  CL 102 106 108 105  CO2 28 21* 19* 22  GLUCOSE 52* 187* 124* 200*  BUN 20 29* 20 15  CALCIUM 9.5 8.8* 8.4* 9.0  CREATININE 0.93 0.99 0.75 0.90  GFRNONAA 55* 51* >60 58*  GFRAA >60 59* >60 >60    LIVER FUNCTION TESTS:  Recent Labs  07/09/16 1304  BILITOT 0.3  AST 28  ALT 23  ALKPHOS 134*  PROT 7.8  ALBUMIN 4.3    Assessment and Plan: 1. s/p PE lysis -patient is doing well.  Her SOB is improving and she is asking when she can go home. -stick site is clean -no further IR intervention.  Further care per primary providers.  Electronically Signed: Henreitta Cea 03/02/2017, 3:23 PM   I spent a total of 15 Minutes at the the patient's bedside AND on the patient's hospital floor or unit, greater than 50% of which was counseling/coordinating care for submassive pulmonary emboli

## 2017-03-03 ENCOUNTER — Encounter (HOSPITAL_COMMUNITY): Payer: Self-pay | Admitting: Nurse Practitioner

## 2017-03-03 LAB — BASIC METABOLIC PANEL
Anion gap: 11 (ref 5–15)
BUN: 15 mg/dL (ref 6–20)
CO2: 24 mmol/L (ref 22–32)
Calcium: 9.3 mg/dL (ref 8.9–10.3)
Chloride: 105 mmol/L (ref 101–111)
Creatinine, Ser: 0.87 mg/dL (ref 0.44–1.00)
GFR, EST NON AFRICAN AMERICAN: 60 mL/min — AB (ref >60)
Glucose, Bld: 106 mg/dL — ABNORMAL HIGH (ref 65–99)
Potassium: 4.1 mmol/L (ref 3.5–5.1)
Sodium: 140 mmol/L (ref 135–145)

## 2017-03-03 LAB — PROTIME-INR
INR: 1.03
PROTHROMBIN TIME: 13.5 s (ref 11.4–15.2)

## 2017-03-03 LAB — CBC
HCT: 38.3 % (ref 36.0–46.0)
Hemoglobin: 12.7 g/dL (ref 12.0–15.0)
MCH: 31 pg (ref 26.0–34.0)
MCHC: 33.2 g/dL (ref 30.0–36.0)
MCV: 93.4 fL (ref 78.0–100.0)
PLATELETS: 148 10*3/uL — AB (ref 150–400)
RBC: 4.1 MIL/uL (ref 3.87–5.11)
RDW: 14.1 % (ref 11.5–15.5)
WBC: 6.3 10*3/uL (ref 4.0–10.5)

## 2017-03-03 LAB — GLUCOSE, CAPILLARY
GLUCOSE-CAPILLARY: 100 mg/dL — AB (ref 65–99)
Glucose-Capillary: 94 mg/dL (ref 65–99)
Glucose-Capillary: 99 mg/dL (ref 65–99)

## 2017-03-03 LAB — HEPARIN LEVEL (UNFRACTIONATED): HEPARIN UNFRACTIONATED: 0.48 [IU]/mL (ref 0.30–0.70)

## 2017-03-03 MED ORDER — WARFARIN SODIUM 5 MG PO TABS
5.0000 mg | ORAL_TABLET | Freq: Once | ORAL | Status: AC
  Administered 2017-03-03: 5 mg via ORAL
  Filled 2017-03-03: qty 1

## 2017-03-03 MED ORDER — METOPROLOL TARTRATE 25 MG PO TABS
25.0000 mg | ORAL_TABLET | Freq: Two times a day (BID) | ORAL | Status: DC
  Administered 2017-03-03 – 2017-03-07 (×9): 25 mg via ORAL
  Filled 2017-03-03 (×5): qty 1
  Filled 2017-03-03: qty 2
  Filled 2017-03-03 (×4): qty 1

## 2017-03-03 NOTE — Consult Note (Signed)
[place] for heparin Indication: pulmonary embolus  Allergies  Allergen Reactions  . Codeine Nausea And Vomiting  . Latex   . Penicillins Rash    Has patient had a PCN reaction causing immediate rash, facial/tongue/throat swelling, SOB or lightheadedness with hypotension: Yes Has patient had a PCN reaction causing severe rash involving mucus membranes or skin necrosis: No Has patient had a PCN reaction that required hospitalization No Has patient had a PCN reaction occurring within the last 10 years: Yes If all of the above answers are "NO", then may proceed with Cephalosporin use.   . Sulfa Antibiotics Rash    Patient Measurements: Height: 5\' 5"  (165.1 cm) Weight: 153 lb (69.4 kg) IBW/kg (Calculated) : 57 Heparin Dosing Weight: 67.1kg  Vital Signs: Temp: 97.8 F (36.6 C) (03/23 0716) Temp Source: Oral (03/23 0716) BP: 127/81 (03/23 0528) Pulse Rate: 111 (03/23 0900)  Labs:  Recent Labs  02/28/17 1421 02/28/17 2340 03/01/17 0026 03/01/17 0333  03/01/17 0930  03/01/17 1810 03/02/17 0504 03/02/17 1028 03/03/17 0302  HGB  --   --   --   --   < > 12.8  --   --  12.0  --  12.7  HCT  --   --   --   --   < > 37.6  --   --  36.5  --  38.3  PLT  --   --   --   --   < > 150  --   --  115*  --  148*  APTT 31  --   --   --   --   --   --   --   --   --   --   LABPROT 14.3  --   --   --   --   --   --   --   --   --  13.5  INR 1.11  --   --   --   --   --   --   --   --   --  1.03  HEPARINUNFRC  --  0.99*  --   --   < >  --   < > 0.53 0.49  --  0.48  CREATININE  --   --  0.75  --   --   --   --   --   --  0.90 0.87  TROPONINI  --  0.15*  --  0.15*  --  0.09*  --   --   --   --   --   < > = values in this interval not displayed.  Estimated Creatinine Clearance: 48 mL/min (by C-G formula based on SCr of 0.87 mg/dL).   Assessment: 83 y.o. female admitted with SOB, CTA showing bilateral PE with R heart strain. Patient received  catheter-directed thrombolysis on 3/21, had bilateral infusions over 12 hours. Thrombolysis is now complete. Pt remains on heparin, level WNL this morning. CBC and fibrinogen stable.  Now on warfarin with INR this am of 1.03  Goal of Therapy:  Heparin level 0.3-0.7 units/ml Monitor platelets by anticoagulation protocol: Yes  Plan:  - Heparin infusion at 1000 units/hr - Warfarin 5 mg po x1 - Daily heparin level and CBC - Daily INR - Continue heparin for a minimum of 5 days and until INR is > 2 for a minimum of 24 hours  Levester Fresh, PharmD, BCPS, BCCCP Clinical Pharmacist Clinical phone for  03/03/2017 from 7a-3:30p: [phone] If after 3:30p, please call main pharmacy at: [phone] 03/03/2017 10:33 AM

## 2017-03-03 NOTE — Progress Notes (Signed)
[facility] PROGRESS NOTE  Jacqueline Tate FBP:[id number] DOB: 20-Mar-1931 DOA: 02/28/2017   PCP: Marcello Fennel, MD     Assessment/Plan: Active Problems:   Pulmonary embolism ([place])   83 year old female with PMH as below, which is significant for skin Ca, HTN, and hypothyroidism.  . She initially complained of SOB dating back to 3/16 while gardening. She presented to her cardiologist 3/19 with complaints of chest pain, which was felt to be atypical. She was also complaining of dizziness and her BP was noted to be somewhat low and her amlodipine was reduced. 3/20 she presented to [facility] ED with worsening dyspnea and upper back pain. In the ED she was dyspneic and tachycardic with clear CXR so she was sent for CT angiogram of the chest, which demonstrated bilateral PE with evidence of R heart strain. She was transferred to [facility] for further evaluation and consideration of catheter directed lysis.TRH   assumed care starting  3/23.   CT angiogram 3/20 > acute large bilateral pulmonary emboli with CT evidence of right heart strain (RV/LV Ratio = 1.6). Moderate hiatal hernia. Hazy parenchymal changes to RLL.     Assessment/plan: Acute Hypoxic Respiratory Distress Secondary to Bilateral Pulmonary Emboli with evidence of right heart strain.  S/p EKOS 3/20. Small segment of DVT in right distal gastrocnemius. -Patient is currently on heparin gtt >> also started on Coumadin per pharmacy, INR 1.03 She should have a repeat echocardiogram in approximately 4-6 months to assess right heart function Continue heparin drip for now for another 24 hours   Elevated Troponin , due to right heart strain? Elevated BNP. H/O HTN. Holding  Norvasc,  Lisinopril, resume Metoprolol which was  initially held due to hypotension,now improved hemodynamics    Hx of hypothyroidism. - continue synthroid  Anxiety, insomnia. continue preadmission xanax, flexeril, duloxetine, gabapentin.  Hx of  hiatal hernia. - prn reglan - protonix   DVT prophylaxsis heparin/Coumadin  Code Status:  Full code     Family Communication: Discussed in detail with the patient, daughter, husband, all imaging results, lab results explained to the patient   Disposition Plan:   1-2 days     Consultants:  Critical care  Procedures:  3/20 admit with PE > taken for EKOS   Antibiotics: Anti-infectives    None         HPI/Subjective: Patient denies any chest pain shortness of breath, able to speak in full sentences  Objective: Vitals:   03/03/17 0310 03/03/17 0500 03/03/17 0528 03/03/17 0716  BP:   127/81   Pulse:   (!) 104   Resp:   16   Temp: 97.8 F (36.6 C)   97.8 F (36.6 C)  TempSrc: Oral   Oral  SpO2:   93%   Weight:  69.4 kg (153 lb)    Height:        Intake/Output Summary (Last 24 hours) at 03/03/17 0820 Last data filed at 03/03/17 0600  Gross per 24 hour  Intake              990 ml  Output                2 ml  Net              988 ml    Exam:  General:  Elderly female of normal body habitus in NAD.  Visiting with family. Neuro:  Alert, oriented, non-focal. HEENT: [place]/AT, PERRL, no JVD.  Cardiovascular:  RRR, no M//R/G. Lungs:  Clear bilaterally.  Normal resp effort. Abdomen:  Soft, non-tender, non-distended. Musculoskeletal:  No acute deformity or ROM limitation. Skin:  Grossly intact.    Data Reviewed: I have personally reviewed following labs and imaging studies  Micro Results Recent Results (from the past 240 hour(s))  MRSA PCR Screening     Status: None   Collection Time: 02/28/17  6:00 PM  Result Value Ref Range Status   MRSA by PCR NEGATIVE NEGATIVE Final    Comment:        The GeneXpert MRSA Assay (FDA approved for NASAL specimens only), is one component of a comprehensive MRSA colonization surveillance program. It is not intended to diagnose MRSA infection nor to guide or monitor treatment for MRSA infections.     Radiology  Reports Ct Angio Chest Pe W And/or Wo Contrast  Result Date: 02/28/2017 CLINICAL DATA:  83 year old female with shortness of breath. Initial encounter. EXAM: CT ANGIOGRAPHY CHEST WITH CONTRAST TECHNIQUE: Multidetector CT imaging of the chest was performed using the standard protocol during bolus administration of intravenous contrast. Multiplanar CT image reconstructions and MIPs were obtained to evaluate the vascular anatomy. CONTRAST:  75 cc Isovue 370. COMPARISON:  None. FINDINGS: Cardiovascular: Large bilateral pulmonary emboli with evidence of right heart strain. Coronary artery calcifications. Atherosclerotic changes thoracic aorta. Exam not optimized to evaluate the aorta although no obvious dissection. Mediastinum/Nodes: Moderately large hiatal hernia. Lungs/Pleura: Hazy parenchymal changes peripheral aspect right lower lobe possibly chronic or related to pulmonary embolus. Scarring/subsegmental atelectasis left lower lobe/lingula. Upper Abdomen: Prominent contrast hepatic veins may be related to increased right heart pressure. Musculoskeletal: Degenerative changes lower thoracic/ upper lumbar spine and lower cervical spine. Review of the MIP images confirms the above findings. IMPRESSION: Positive for acute large bilateral pulmonary emboli with CT evidence of right heart strain (RV/LV Ratio = 1.6) consistent with at least submassive (intermediate risk) PE. The presence of right heart strain has been associated with an increased risk of morbidity and mortality. Please activate Code PE by paging [phone]. Aortic atherosclerosis. Moderately large hiatal hernia. Hazy parenchymal changes peripheral aspect right lower lobe possibly chronic or related to pulmonary embolus. Scarring/subsegmental atelectasis left lower lobe/lingula. These results were called by telephone at the time of interpretation on 02/28/2017 at 3:14 pm to Dr. Rudene Re , who verbally acknowledged these results. Electronically  Signed   By: Genia Del M.D.   On: 02/28/2017 15:24   Ir Angiogram Pulmonary Bilateral Selective  Result Date: 03/01/2017 INDICATION: 83 year old with large bilateral pulmonary emboli and right heart strain. Patient is a candidate for ultrasound assisted pulmonary emboli thrombolysis. EXAM: PULMONARY ARTERIOGRAPHY WITH SELECTIVE ANGIOGRAPHY OF BILATERAL PULMONARY ARTERIES PLACEMENT OF BILATERAL EKOS INFUSION CATHETERS IN PULMONARY ARTERIES ULTRASOUND GUIDANCE FOR VASCULAR ACCESS X 2 COMPARISON:  Chest CTA 02/28/2017 MEDICATIONS: None. ANESTHESIA/SEDATION: Versed 0.5 mg IV; Fentanyl 25 mcg IV Moderate Sedation Time:  45 minutes The patient was continuously monitored during the procedure by the interventional radiology nurse under my direct supervision. FLUOROSCOPY TIME:  Fluoroscopy Time: 11 minutes 24 seconds (45 mGy). CONTRAST:  25 mL [id number] COMPLICATIONS: None immediate. TECHNIQUE: Informed written consent was obtained from the patient after a thorough discussion of the procedural risks, benefits and alternatives. All questions were addressed. Maximal Sterile Barrier Technique was utilized including caps, mask, sterile gowns, sterile gloves, sterile drape, hand hygiene and skin antiseptic. A timeout was performed prior to the initiation of the procedure. Patient was placed supine. Ultrasound demonstrated a  large patent right internal jugular vein. The right side of the neck was prepped and draped in sterile fashion. Skin was anesthetized with 1% lidocaine. Using ultrasound guidance, 21 gauge needle was directed into the right internal jugular vein and a micropuncture dilator set was placed. A second micropuncture dilator set was placed with ultrasound guidance just below the other catheter. Subsequently, the micro catheters were upsized to 6 [place] vascular sheaths. C2 catheter and Bentson wire were advanced through the heart and placed in the right pulmonary artery. Right pulmonary artery pressure and  angiography performed. Catheter was advanced into a right lower lobe pulmonary artery and a Rosen wire was placed. C2 was again used to navigate the heart and was advanced into the left pulmonary artery. Left pulmonary artery pressure and arteriogram was performed. Catheter was then advanced into a left lower lobe pulmonary artery using a Glidewire. A second Rosen wire was placed. A 12 cm infusion length EKOS catheter was advanced over the Rosen wire and positioned in the left pulmonary artery. Small amount of contrast was injected in the lower lobe pulmonary artery to confirm adequate positioning. A second 12 cm EKOS catheter was advanced over the other Rosen wire and advanced into the right lower lobe pulmonary artery. Again, a small amount of contrast was injected to confirm adequate placement. Both sheaths were sutured to the skin. The infusion catheters were secured to the sheaths. Patient was transferred to the ICU and the tPA infusion was started through both catheters. Fluoroscopic and ultrasound images were taken and saved for documentation. FINDINGS: Right pulmonary artery pressure:  51/25, mean of 35 Left pulmonary artery pressure:  48/21, mean of 33 Infusion catheters placed in the bilateral lower lobe pulmonary arteries. IMPRESSION: Successful placement of bilateral pulmonary artery infusion EKOS catheters. Plan for ultrasound assisted pulmonary embolism thrombolysis through bilateral catheters. Plan for 1 mg of tPA through each catheter for 12 hours, total dose of 24 mg. Plan to recheck pulmonary artery pressures following the 12 hour infusion. Elevated pulmonary artery pressures as described. Electronically Signed   By: Markus Daft M.D.   On: 03/01/2017 08:22   Ir Angiogram Selective Each Additional Vessel  Result Date: 03/01/2017 INDICATION: 83 year old with large bilateral pulmonary emboli and right heart strain. Patient is a candidate for ultrasound assisted pulmonary emboli thrombolysis. EXAM:  PULMONARY ARTERIOGRAPHY WITH SELECTIVE ANGIOGRAPHY OF BILATERAL PULMONARY ARTERIES PLACEMENT OF BILATERAL EKOS INFUSION CATHETERS IN PULMONARY ARTERIES ULTRASOUND GUIDANCE FOR VASCULAR ACCESS X 2 COMPARISON:  Chest CTA 02/28/2017 MEDICATIONS: None. ANESTHESIA/SEDATION: Versed 0.5 mg IV; Fentanyl 25 mcg IV Moderate Sedation Time:  45 minutes The patient was continuously monitored during the procedure by the interventional radiology nurse under my direct supervision. FLUOROSCOPY TIME:  Fluoroscopy Time: 11 minutes 24 seconds (45 mGy). CONTRAST:  25 mL [id number] COMPLICATIONS: None immediate. TECHNIQUE: Informed written consent was obtained from the patient after a thorough discussion of the procedural risks, benefits and alternatives. All questions were addressed. Maximal Sterile Barrier Technique was utilized including caps, mask, sterile gowns, sterile gloves, sterile drape, hand hygiene and skin antiseptic. A timeout was performed prior to the initiation of the procedure. Patient was placed supine. Ultrasound demonstrated a large patent right internal jugular vein. The right side of the neck was prepped and draped in sterile fashion. Skin was anesthetized with 1% lidocaine. Using ultrasound guidance, 21 gauge needle was directed into the right internal jugular vein and a micropuncture dilator set was placed. A second micropuncture dilator set was placed with ultrasound guidance  just below the other catheter. Subsequently, the micro catheters were upsized to 6 [place] vascular sheaths. C2 catheter and Bentson wire were advanced through the heart and placed in the right pulmonary artery. Right pulmonary artery pressure and angiography performed. Catheter was advanced into a right lower lobe pulmonary artery and a Rosen wire was placed. C2 was again used to navigate the heart and was advanced into the left pulmonary artery. Left pulmonary artery pressure and arteriogram was performed. Catheter was then advanced into a  left lower lobe pulmonary artery using a Glidewire. A second Rosen wire was placed. A 12 cm infusion length EKOS catheter was advanced over the Rosen wire and positioned in the left pulmonary artery. Small amount of contrast was injected in the lower lobe pulmonary artery to confirm adequate positioning. A second 12 cm EKOS catheter was advanced over the other Rosen wire and advanced into the right lower lobe pulmonary artery. Again, a small amount of contrast was injected to confirm adequate placement. Both sheaths were sutured to the skin. The infusion catheters were secured to the sheaths. Patient was transferred to the ICU and the tPA infusion was started through both catheters. Fluoroscopic and ultrasound images were taken and saved for documentation. FINDINGS: Right pulmonary artery pressure:  51/25, mean of 35 Left pulmonary artery pressure:  48/21, mean of 33 Infusion catheters placed in the bilateral lower lobe pulmonary arteries. IMPRESSION: Successful placement of bilateral pulmonary artery infusion EKOS catheters. Plan for ultrasound assisted pulmonary embolism thrombolysis through bilateral catheters. Plan for 1 mg of tPA through each catheter for 12 hours, total dose of 24 mg. Plan to recheck pulmonary artery pressures following the 12 hour infusion. Elevated pulmonary artery pressures as described. Electronically Signed   By: Markus Daft M.D.   On: 03/01/2017 08:22   Ir Angiogram Selective Each Additional Vessel  Result Date: 03/01/2017 INDICATION: 83 year old with large bilateral pulmonary emboli and right heart strain. Patient is a candidate for ultrasound assisted pulmonary emboli thrombolysis. EXAM: PULMONARY ARTERIOGRAPHY WITH SELECTIVE ANGIOGRAPHY OF BILATERAL PULMONARY ARTERIES PLACEMENT OF BILATERAL EKOS INFUSION CATHETERS IN PULMONARY ARTERIES ULTRASOUND GUIDANCE FOR VASCULAR ACCESS X 2 COMPARISON:  Chest CTA 02/28/2017 MEDICATIONS: None. ANESTHESIA/SEDATION: Versed 0.5 mg IV; Fentanyl 25  mcg IV Moderate Sedation Time:  45 minutes The patient was continuously monitored during the procedure by the interventional radiology nurse under my direct supervision. FLUOROSCOPY TIME:  Fluoroscopy Time: 11 minutes 24 seconds (45 mGy). CONTRAST:  25 mL [id number] COMPLICATIONS: None immediate. TECHNIQUE: Informed written consent was obtained from the patient after a thorough discussion of the procedural risks, benefits and alternatives. All questions were addressed. Maximal Sterile Barrier Technique was utilized including caps, mask, sterile gowns, sterile gloves, sterile drape, hand hygiene and skin antiseptic. A timeout was performed prior to the initiation of the procedure. Patient was placed supine. Ultrasound demonstrated a large patent right internal jugular vein. The right side of the neck was prepped and draped in sterile fashion. Skin was anesthetized with 1% lidocaine. Using ultrasound guidance, 21 gauge needle was directed into the right internal jugular vein and a micropuncture dilator set was placed. A second micropuncture dilator set was placed with ultrasound guidance just below the other catheter. Subsequently, the micro catheters were upsized to 6 [place] vascular sheaths. C2 catheter and Bentson wire were advanced through the heart and placed in the right pulmonary artery. Right pulmonary artery pressure and angiography performed. Catheter was advanced into a right lower lobe pulmonary artery and a Rosen wire was placed.  C2 was again used to navigate the heart and was advanced into the left pulmonary artery. Left pulmonary artery pressure and arteriogram was performed. Catheter was then advanced into a left lower lobe pulmonary artery using a Glidewire. A second Rosen wire was placed. A 12 cm infusion length EKOS catheter was advanced over the Rosen wire and positioned in the left pulmonary artery. Small amount of contrast was injected in the lower lobe pulmonary artery to confirm adequate  positioning. A second 12 cm EKOS catheter was advanced over the other Rosen wire and advanced into the right lower lobe pulmonary artery. Again, a small amount of contrast was injected to confirm adequate placement. Both sheaths were sutured to the skin. The infusion catheters were secured to the sheaths. Patient was transferred to the ICU and the tPA infusion was started through both catheters. Fluoroscopic and ultrasound images were taken and saved for documentation. FINDINGS: Right pulmonary artery pressure:  51/25, mean of 35 Left pulmonary artery pressure:  48/21, mean of 33 Infusion catheters placed in the bilateral lower lobe pulmonary arteries. IMPRESSION: Successful placement of bilateral pulmonary artery infusion EKOS catheters. Plan for ultrasound assisted pulmonary embolism thrombolysis through bilateral catheters. Plan for 1 mg of tPA through each catheter for 12 hours, total dose of 24 mg. Plan to recheck pulmonary artery pressures following the 12 hour infusion. Elevated pulmonary artery pressures as described. Electronically Signed   By: Markus Daft M.D.   On: 03/01/2017 08:22   Ir US Guide Vasc Access Right  Result Date: 03/01/2017 INDICATION: 83 year old with large bilateral pulmonary emboli and right heart strain. Patient is a candidate for ultrasound assisted pulmonary emboli thrombolysis. EXAM: PULMONARY ARTERIOGRAPHY WITH SELECTIVE ANGIOGRAPHY OF BILATERAL PULMONARY ARTERIES PLACEMENT OF BILATERAL EKOS INFUSION CATHETERS IN PULMONARY ARTERIES ULTRASOUND GUIDANCE FOR VASCULAR ACCESS X 2 COMPARISON:  Chest CTA 02/28/2017 MEDICATIONS: None. ANESTHESIA/SEDATION: Versed 0.5 mg IV; Fentanyl 25 mcg IV Moderate Sedation Time:  45 minutes The patient was continuously monitored during the procedure by the interventional radiology nurse under my direct supervision. FLUOROSCOPY TIME:  Fluoroscopy Time: 11 minutes 24 seconds (45 mGy). CONTRAST:  25 mL [id number] COMPLICATIONS: None immediate. TECHNIQUE:  Informed written consent was obtained from the patient after a thorough discussion of the procedural risks, benefits and alternatives. All questions were addressed. Maximal Sterile Barrier Technique was utilized including caps, mask, sterile gowns, sterile gloves, sterile drape, hand hygiene and skin antiseptic. A timeout was performed prior to the initiation of the procedure. Patient was placed supine. Ultrasound demonstrated a large patent right internal jugular vein. The right side of the neck was prepped and draped in sterile fashion. Skin was anesthetized with 1% lidocaine. Using ultrasound guidance, 21 gauge needle was directed into the right internal jugular vein and a micropuncture dilator set was placed. A second micropuncture dilator set was placed with ultrasound guidance just below the other catheter. Subsequently, the micro catheters were upsized to 6 [place] vascular sheaths. C2 catheter and Bentson wire were advanced through the heart and placed in the right pulmonary artery. Right pulmonary artery pressure and angiography performed. Catheter was advanced into a right lower lobe pulmonary artery and a Rosen wire was placed. C2 was again used to navigate the heart and was advanced into the left pulmonary artery. Left pulmonary artery pressure and arteriogram was performed. Catheter was then advanced into a left lower lobe pulmonary artery using a Glidewire. A second Rosen wire was placed. A 12 cm infusion length EKOS catheter was advanced over the [employer]  wire and positioned in the left pulmonary artery. Small amount of contrast was injected in the lower lobe pulmonary artery to confirm adequate positioning. A second 12 cm EKOS catheter was advanced over the other Rosen wire and advanced into the right lower lobe pulmonary artery. Again, a small amount of contrast was injected to confirm adequate placement. Both sheaths were sutured to the skin. The infusion catheters were secured to the sheaths. Patient  was transferred to the ICU and the tPA infusion was started through both catheters. Fluoroscopic and ultrasound images were taken and saved for documentation. FINDINGS: Right pulmonary artery pressure:  51/25, mean of 35 Left pulmonary artery pressure:  48/21, mean of 33 Infusion catheters placed in the bilateral lower lobe pulmonary arteries. IMPRESSION: Successful placement of bilateral pulmonary artery infusion EKOS catheters. Plan for ultrasound assisted pulmonary embolism thrombolysis through bilateral catheters. Plan for 1 mg of tPA through each catheter for 12 hours, total dose of 24 mg. Plan to recheck pulmonary artery pressures following the 12 hour infusion. Elevated pulmonary artery pressures as described. Electronically Signed   By: Markus Daft M.D.   On: 03/01/2017 08:22   Ir Infusion Thrombol Arterial Initial (ms)  Result Date: 03/01/2017 INDICATION: 83 year old with large bilateral pulmonary emboli and right heart strain. Patient is a candidate for ultrasound assisted pulmonary emboli thrombolysis. EXAM: PULMONARY ARTERIOGRAPHY WITH SELECTIVE ANGIOGRAPHY OF BILATERAL PULMONARY ARTERIES PLACEMENT OF BILATERAL EKOS INFUSION CATHETERS IN PULMONARY ARTERIES ULTRASOUND GUIDANCE FOR VASCULAR ACCESS X 2 COMPARISON:  Chest CTA 02/28/2017 MEDICATIONS: None. ANESTHESIA/SEDATION: Versed 0.5 mg IV; Fentanyl 25 mcg IV Moderate Sedation Time:  45 minutes The patient was continuously monitored during the procedure by the interventional radiology nurse under my direct supervision. FLUOROSCOPY TIME:  Fluoroscopy Time: 11 minutes 24 seconds (45 mGy). CONTRAST:  25 mL [id number] COMPLICATIONS: None immediate. TECHNIQUE: Informed written consent was obtained from the patient after a thorough discussion of the procedural risks, benefits and alternatives. All questions were addressed. Maximal Sterile Barrier Technique was utilized including caps, mask, sterile gowns, sterile gloves, sterile drape, hand hygiene and skin  antiseptic. A timeout was performed prior to the initiation of the procedure. Patient was placed supine. Ultrasound demonstrated a large patent right internal jugular vein. The right side of the neck was prepped and draped in sterile fashion. Skin was anesthetized with 1% lidocaine. Using ultrasound guidance, 21 gauge needle was directed into the right internal jugular vein and a micropuncture dilator set was placed. A second micropuncture dilator set was placed with ultrasound guidance just below the other catheter. Subsequently, the micro catheters were upsized to 6 [place] vascular sheaths. C2 catheter and Bentson wire were advanced through the heart and placed in the right pulmonary artery. Right pulmonary artery pressure and angiography performed. Catheter was advanced into a right lower lobe pulmonary artery and a Rosen wire was placed. C2 was again used to navigate the heart and was advanced into the left pulmonary artery. Left pulmonary artery pressure and arteriogram was performed. Catheter was then advanced into a left lower lobe pulmonary artery using a Glidewire. A second Rosen wire was placed. A 12 cm infusion length EKOS catheter was advanced over the Rosen wire and positioned in the left pulmonary artery. Small amount of contrast was injected in the lower lobe pulmonary artery to confirm adequate positioning. A second 12 cm EKOS catheter was advanced over the other Rosen wire and advanced into the right lower lobe pulmonary artery. Again, a small amount of contrast was injected to confirm  adequate placement. Both sheaths were sutured to the skin. The infusion catheters were secured to the sheaths. Patient was transferred to the ICU and the tPA infusion was started through both catheters. Fluoroscopic and ultrasound images were taken and saved for documentation. FINDINGS: Right pulmonary artery pressure:  51/25, mean of 35 Left pulmonary artery pressure:  48/21, mean of 33 Infusion catheters placed in  the bilateral lower lobe pulmonary arteries. IMPRESSION: Successful placement of bilateral pulmonary artery infusion EKOS catheters. Plan for ultrasound assisted pulmonary embolism thrombolysis through bilateral catheters. Plan for 1 mg of tPA through each catheter for 12 hours, total dose of 24 mg. Plan to recheck pulmonary artery pressures following the 12 hour infusion. Elevated pulmonary artery pressures as described. Electronically Signed   By: Markus Daft M.D.   On: 03/01/2017 08:22   Ir Infusion Thrombol Arterial Initial (ms)  Result Date: 03/01/2017 INDICATION: 83 year old with large bilateral pulmonary emboli and right heart strain. Patient is a candidate for ultrasound assisted pulmonary emboli thrombolysis. EXAM: PULMONARY ARTERIOGRAPHY WITH SELECTIVE ANGIOGRAPHY OF BILATERAL PULMONARY ARTERIES PLACEMENT OF BILATERAL EKOS INFUSION CATHETERS IN PULMONARY ARTERIES ULTRASOUND GUIDANCE FOR VASCULAR ACCESS X 2 COMPARISON:  Chest CTA 02/28/2017 MEDICATIONS: None. ANESTHESIA/SEDATION: Versed 0.5 mg IV; Fentanyl 25 mcg IV Moderate Sedation Time:  45 minutes The patient was continuously monitored during the procedure by the interventional radiology nurse under my direct supervision. FLUOROSCOPY TIME:  Fluoroscopy Time: 11 minutes 24 seconds (45 mGy). CONTRAST:  25 mL [id number] COMPLICATIONS: None immediate. TECHNIQUE: Informed written consent was obtained from the patient after a thorough discussion of the procedural risks, benefits and alternatives. All questions were addressed. Maximal Sterile Barrier Technique was utilized including caps, mask, sterile gowns, sterile gloves, sterile drape, hand hygiene and skin antiseptic. A timeout was performed prior to the initiation of the procedure. Patient was placed supine. Ultrasound demonstrated a large patent right internal jugular vein. The right side of the neck was prepped and draped in sterile fashion. Skin was anesthetized with 1% lidocaine. Using ultrasound  guidance, 21 gauge needle was directed into the right internal jugular vein and a micropuncture dilator set was placed. A second micropuncture dilator set was placed with ultrasound guidance just below the other catheter. Subsequently, the micro catheters were upsized to 6 [place] vascular sheaths. C2 catheter and Bentson wire were advanced through the heart and placed in the right pulmonary artery. Right pulmonary artery pressure and angiography performed. Catheter was advanced into a right lower lobe pulmonary artery and a Rosen wire was placed. C2 was again used to navigate the heart and was advanced into the left pulmonary artery. Left pulmonary artery pressure and arteriogram was performed. Catheter was then advanced into a left lower lobe pulmonary artery using a Glidewire. A second Rosen wire was placed. A 12 cm infusion length EKOS catheter was advanced over the Rosen wire and positioned in the left pulmonary artery. Small amount of contrast was injected in the lower lobe pulmonary artery to confirm adequate positioning. A second 12 cm EKOS catheter was advanced over the other Rosen wire and advanced into the right lower lobe pulmonary artery. Again, a small amount of contrast was injected to confirm adequate placement. Both sheaths were sutured to the skin. The infusion catheters were secured to the sheaths. Patient was transferred to the ICU and the tPA infusion was started through both catheters. Fluoroscopic and ultrasound images were taken and saved for documentation. FINDINGS: Right pulmonary artery pressure:  51/25, mean of 35 Left pulmonary artery pressure:  48/21, mean of 33 Infusion catheters placed in the bilateral lower lobe pulmonary arteries. IMPRESSION: Successful placement of bilateral pulmonary artery infusion EKOS catheters. Plan for ultrasound assisted pulmonary embolism thrombolysis through bilateral catheters. Plan for 1 mg of tPA through each catheter for 12 hours, total dose of 24 mg.  Plan to recheck pulmonary artery pressures following the 12 hour infusion. Elevated pulmonary artery pressures as described. Electronically Signed   By: Markus Daft M.D.   On: 03/01/2017 08:22   Ir Jacolyn Reedy F/u Eval Art/ven Final Day (ms)  Result Date: 03/01/2017 INDICATION: 83 year old female with large bilateral pulmonary emboli and recently completed a 12 hour infusion of tPA through bilateral EKOS catheters. Total dose of 24 mg of tPA was given. Patient says that her breathing has improved. No signs of bleeding. EXAM: FOLLOW-UP THROMBOLYTIC THERAPY- FINAL DAY COMPARISON:  None. MEDICATIONS: None. ANESTHESIA/SEDATION: None FLUOROSCOPY TIME:  None COMPLICATIONS: None immediate. TECHNIQUE: Pulmonary artery pressures were obtained from EKOS catheters. Catheters were slightly pulled back so that the pressures were obtained in the main left and right pulmonary arteries. The infusion catheters were completely removed. The right jugular vascular sheaths were removed with manual compression. Bandage placed over the puncture sites. FINDINGS: The mean pulmonary artery pressure in bilateral pulmonary arteries is 20. Mean pulmonary artery pressures prior to thrombolytic therapy was 33 on the left and 35 on the right. IMPRESSION: Decreased pulmonary artery pressures bilaterally. Completion of the pulmonary emboli thrombolysis. Electronically Signed   By: Markus Daft M.D.   On: 03/01/2017 13:29     CBC  Recent Labs Lab 02/28/17 1415 03/01/17 0540 03/01/17 0930 03/02/17 0504 03/03/17 0302  WBC 10.2 6.3 6.9 4.6 6.3  HGB 14.2 12.8 12.8 12.0 12.7  HCT 41.2 37.4 37.6 36.5 38.3  PLT 193 141* 150 115* 148*  MCV 93.0 92.1 91.7 94.6 93.4  MCH 32.2 31.5 31.2 31.1 31.0  MCHC 34.6 34.2 34.0 32.9 33.2  RDW 14.0 14.3 14.0 14.7 14.1    Chemistries   Recent Labs Lab 02/28/17 1415 03/01/17 0026 03/02/17 1028 03/03/17 0302  NA 135 139 137 140  K 4.3 3.4* 4.1 4.1  CL 106 108 105 105  CO2 21* 19* 22 24  GLUCOSE  187* 124* 200* 106*  BUN 29* 20 15 15   CREATININE 0.99 0.75 0.90 0.87  CALCIUM 8.8* 8.4* 9.0 9.3  MG  --  1.9  --   --    ------------------------------------------------------------------------------------------------------------------ estimated creatinine clearance is 48 mL/min (by C-G formula based on SCr of 0.87 mg/dL). ------------------------------------------------------------------------------------------------------------------ No results for input(s): HGBA1C in the last 72 hours. ------------------------------------------------------------------------------------------------------------------ No results for input(s): CHOL, HDL, LDLCALC, TRIG, CHOLHDL, LDLDIRECT in the last 72 hours. ------------------------------------------------------------------------------------------------------------------ No results for input(s): TSH, T4TOTAL, T3FREE, THYROIDAB in the last 72 hours.  Invalid input(s): FREET3 ------------------------------------------------------------------------------------------------------------------ No results for input(s): VITAMINB12, FOLATE, FERRITIN, TIBC, IRON, RETICCTPCT in the last 72 hours.  Coagulation profile  Recent Labs Lab 02/28/17 1421 03/03/17 0302  INR 1.11 1.03    No results for input(s): DDIMER in the last 72 hours.  Cardiac Enzymes  Recent Labs Lab 02/28/17 2340 03/01/17 0333 03/01/17 0930  TROPONINI 0.15* 0.15* 0.09*   ------------------------------------------------------------------------------------------------------------------ Invalid input(s): POCBNP   CBG:  Recent Labs Lab 03/02/17 1620 03/02/17 1918 03/02/17 2341 03/03/17 0308 03/03/17 0714  GLUCAP 146* 164* 96 100* 94       Studies: Ir Jacolyn Reedy F/u Eval Art/ven Final Day (ms)  Result Date: 03/01/2017 INDICATION: 83 year old female with large bilateral pulmonary emboli and  recently completed a 12 hour infusion of tPA through bilateral EKOS catheters. Total  dose of 24 mg of tPA was given. Patient says that her breathing has improved. No signs of bleeding. EXAM: FOLLOW-UP THROMBOLYTIC THERAPY- FINAL DAY COMPARISON:  None. MEDICATIONS: None. ANESTHESIA/SEDATION: None FLUOROSCOPY TIME:  None COMPLICATIONS: None immediate. TECHNIQUE: Pulmonary artery pressures were obtained from EKOS catheters. Catheters were slightly pulled back so that the pressures were obtained in the main left and right pulmonary arteries. The infusion catheters were completely removed. The right jugular vascular sheaths were removed with manual compression. Bandage placed over the puncture sites. FINDINGS: The mean pulmonary artery pressure in bilateral pulmonary arteries is 20. Mean pulmonary artery pressures prior to thrombolytic therapy was 33 on the left and 35 on the right. IMPRESSION: Decreased pulmonary artery pressures bilaterally. Completion of the pulmonary emboli thrombolysis. Electronically Signed   By: Markus Daft M.D.   On: 03/01/2017 13:29      No results found for: HGBA1C Lab Results  Component Value Date   CREATININE 0.87 03/03/2017       Scheduled Meds: . cyclobenzaprine  5 mg Oral QHS  . docusate sodium  100 mg Oral Daily  . DULoxetine  60 mg Oral Daily  . gabapentin  600 mg Oral TID  . levothyroxine  50 mcg Oral QAC breakfast  . loratadine  10 mg Oral Daily  . metoCLOPramide  5 mg Oral Daily  . pantoprazole  40 mg Oral Q1200  . sodium chloride flush  3 mL Intravenous Q12H  . warfarin   Does not apply Once  . Warfarin - Pharmacist Dosing Inpatient   Does not apply q1800   Continuous Infusions: . sodium chloride    . sodium chloride    . sodium chloride    . sodium chloride    . heparin 1,000 Units/hr (03/02/17 1900)     LOS: 3 days    Time spent: >30 MINS    [facility] Pager [phone]. If 7PM-7AM, please contact night-coverage at [email or web address], password [email or web address] 03/03/2017, 8:20 AM  LOS: 3 days

## 2017-03-03 NOTE — Progress Notes (Signed)
Patient arrived on the unit from 48M, assessment completed see flowsheet, patient oriented to room and staff, placed on tele ccmd notified, bed in lowest position ,call bell within reach will continue to monitor.

## 2017-03-03 NOTE — Progress Notes (Signed)
Patient ambulated 300 ft in the hallway with this RN, using a rolling walker on room air. Ambulation well tolerated with HR in the low 100's. Patient returned back to bed, bed in lowest position, call bell within reach will continue to monitor.

## 2017-03-04 ENCOUNTER — Encounter (HOSPITAL_COMMUNITY): Payer: Self-pay | Admitting: *Deleted

## 2017-03-04 DIAGNOSIS — I82491 Acute embolism and thrombosis of other specified deep vein of right lower extremity: Secondary | ICD-10-CM

## 2017-03-04 DIAGNOSIS — I10 Essential (primary) hypertension: Secondary | ICD-10-CM

## 2017-03-04 DIAGNOSIS — E039 Hypothyroidism, unspecified: Secondary | ICD-10-CM

## 2017-03-04 LAB — CBC
HCT: 36.4 % (ref 36.0–46.0)
Hemoglobin: 12.2 g/dL (ref 12.0–15.0)
MCH: 31.4 pg (ref 26.0–34.0)
MCHC: 33.5 g/dL (ref 30.0–36.0)
MCV: 93.8 fL (ref 78.0–100.0)
PLATELETS: 153 10*3/uL (ref 150–400)
RBC: 3.88 MIL/uL (ref 3.87–5.11)
RDW: 14.2 % (ref 11.5–15.5)
WBC: 5.3 10*3/uL (ref 4.0–10.5)

## 2017-03-04 LAB — PROTIME-INR
INR: 1.29
Prothrombin Time: 16.2 seconds — ABNORMAL HIGH (ref 11.4–15.2)

## 2017-03-04 LAB — HEPARIN LEVEL (UNFRACTIONATED): Heparin Unfractionated: 0.42 IU/mL (ref 0.30–0.70)

## 2017-03-04 LAB — GLUCOSE, CAPILLARY
Glucose-Capillary: 102 mg/dL — ABNORMAL HIGH (ref 65–99)
Glucose-Capillary: 110 mg/dL — ABNORMAL HIGH (ref 65–99)
Glucose-Capillary: 108 mg/dL — ABNORMAL HIGH (ref 65–99)

## 2017-03-04 MED ORDER — WARFARIN SODIUM 5 MG PO TABS
5.0000 mg | ORAL_TABLET | Freq: Once | ORAL | Status: AC
  Administered 2017-03-04: 5 mg via ORAL
  Filled 2017-03-04: qty 1

## 2017-03-04 MED ORDER — LISINOPRIL 10 MG PO TABS
20.0000 mg | ORAL_TABLET | Freq: Every day | ORAL | Status: DC
  Administered 2017-03-04 – 2017-03-07 (×4): 20 mg via ORAL
  Filled 2017-03-04 (×4): qty 2

## 2017-03-04 NOTE — Discharge Instructions (Addendum)
Pulmonary Embolism A pulmonary embolism (PE) is a sudden blockage or decrease of blood flow in one lung or both lungs. Most blockages come from a blood clot that travels from the legs or the pelvis to the lungs. PE is a dangerous and potentially life-threatening condition if it is not treated right away. What are the causes? A pulmonary embolism occurs most commonly when a blood clot travels from one of your veins to your lungs. Rarely, PE is caused by air, fat, amniotic fluid, or part of a tumor traveling through your veins to your lungs. What increases the risk? A PE is more likely to develop in:  People who smoke.  People who areolder, especially over 4 years of age.  People who are overweight (obese).  People who sit or lie still for a long time, such as during long-distance travel (over 4 hours), bed rest, hospitalization, or during recovery from certain medical conditions like a stroke.  People who do not engage in much physical activity (sedentary lifestyle).  People who have chronic breathing disorders.  People whohave a personal or family history of blood clots or blood clotting disease.  People whohave peripheral vascular disease (PVD), diabetes, or some types of cancer.  People who haveheart disease, especially if the person had a recent heart attack or has congestive heart failure.  People who have neurological diseases that affect the legs (leg paresis).  People who have had a traumatic injury, such as breaking a hip or leg.  People whohave recently had major or lengthy surgery, especially on the hip, knee, or abdomen.  People who have hada central line placed inside a large vein.  People who takemedicines that contain the hormone estrogen. These include birth control pills and hormone replacement therapy.  Pregnancy or during childbirth or the postpartum period. What are the signs or symptoms? The symptoms of a PE usually start suddenly and  include:  Shortness of breath while active or at rest.  Coughing or coughing up blood or blood-tinged mucus.  Chest pain that is often worse with deep breaths.  Rapid or irregular heartbeat.  Feeling light-headed or dizzy.  Fainting.  Feelinganxious.  Sweating. There may also be pain and swelling in a leg if that is where the blood clot started. These symptoms may represent a serious problem that is an emergency. Do not wait to see if the symptoms will go away. Get medical help right away. Call your local emergency services (911 in the [place] Do not drive yourself to the hospital.  How is this diagnosed? Your health care provider will take a medical history and perform a physical exam. You may also have other tests, including:  Blood tests to assess the clotting properties of your blood, assess oxygen levels in your blood, and find blood clots.  Imaging tests, such as CT, ultrasound, MRI, X-ray, and other tests to see if you have clots anywhere in your body.  An electrocardiogram (ECG) to look for heart strain from blood clots in the lungs. How is this treated? The main goals of PE treatment are:  To stop a blood clot from growing larger.  To stop new blood clots from forming. The type of treatment that you receive depends on many factors, such as the cause of your PE, your risk for bleeding or developing more clots, and other medical conditions that you have. Sometimes, a combination of treatments is necessary. This condition may be treated with:  Medicines, including newer oral blood thinners (anticoagulants), warfarin, low  molecular weight heparins, thrombolytics, or heparins.  Wearing compression stockings or using different types of devices.  Surgery (rare) to remove the blood clot or to place a filter in your abdomen to stop the blood clot from traveling to your lungs. Treatments for a PE are often divided into immediate treatment, long-term treatment (up to 3 months  after PE), and extended treatment (more than 3 months after PE). Your treatment may continue for several months. This is called maintenance therapy, and it is used to prevent the forming of new blood clots. You can work with your health care provider to choose the treatment program that is best for you. What are anticoagulants?  Anticoagulants are medicines that treat PEs. They can stop current blood clots from growing and stop new clots from forming. They cannot dissolve existing clots. Your body dissolves clots by itself over time. Anticoagulants are given by mouth, by injection, or through an IV tube. What are thrombolytics?  Thrombolytics are clot-dissolving medicines that are used to dissolve a PE. They carry a high risk of bleeding, so they tend to be used only in severe cases or if you have very low blood pressure. Follow these instructions at home: If you are taking a newer oral anticoagulant:   Take the medicine every single day at the same time each day.  Understand what foods and drugs interact with this medicine.  Understand that there are no regular blood tests required when using this medicine.  Understandthe side effects of this medicine, including excessive bruising or bleeding. Ask your health care provider or pharmacist about other possible side effects. If you are taking warfarin:   Understand how to take warfarin and know which foods can affect how warfarin works in Veterinary surgeon.  Understand that it is dangerous to taketoo much or too little warfarin. Too much warfarin increases the risk of bleeding. Too little warfarin continues to allow the risk for blood clots.  Follow your PT and INR blood testing schedule. The PT and INR results allow your health care provider to adjust your dose of warfarin. It is very important that you have your PT and INR tested as often as told by your health care provider.  Avoid major changes in your diet, or tell your health care provider before  you change your diet. Arrange a visit with a registered dietitian to answer your questions. Many foods, especially foods that are high in vitamin K, can interfere with warfarin and affect the PT and INR results. Eat a consistent amount of foods that are high in vitamin K, such as:  Spinach, kale, broccoli, cabbage, collard greens, turnip greens, Brussels sprouts, peas, cauliflower, seaweed, and parsley.  Beef liver and pork liver.  Green tea.  Soybean oil.  Tell your health care provider about any and all medicines, vitamins, and supplements that you take, including aspirin and other over-the-counter anti-inflammatory medicines. Be especially cautious with aspirin and anti-inflammatory medicines. Do not take those before you ask your health care provider if it is safe to do so. This is important because many medicines can interfere with warfarin and affect the PT and INR results.  Do not start or stop taking any over-the-counter or prescription medicine unless your health care provider or pharmacist tells you to do so. If you take warfarin, you will also need to do these things:  Hold pressure over cuts for longer than usual.  Tell your dentist and other health care providers that you are taking warfarin before you have  any procedures in which bleeding may occur.  Avoid alcohol or drink very small amounts. Tell your health care provider if you change your alcohol intake.  Do not use tobacco products, including cigarettes, chewing tobacco, and e-cigarettes. If you need help quitting, ask your health care provider.  Avoid contact sports. General instructions   Take over-the-counter and prescription medicines only as told by your health care provider. Anticoagulant medicines can have side effects, including easy bruising and difficulty stopping bleeding. If you are prescribed an anticoagulant, you will also need to do these things:  Hold pressure over cuts for longer than usual.  Tell your  dentist and other health care providers that you are taking anticoagulants before you have any procedures in which bleeding may occur.  Avoid contact sports.  Wear a medical alert bracelet or carry a medical alert card that says you have had a PE.  Ask your health care provider how soon you can go back to your normal activities. Stay active to prevent new blood clots from forming.  Make sure to exercise while traveling or when you have been sitting or standing for a long period of time. It is very important to exercise. Exercise your legs by walking or by tightening and relaxing your leg muscles often. Take frequent walks.  Wear compression stockings as told by your health care provider to help prevent more blood clots from forming.  Do not use tobacco products, including cigarettes, chewing tobacco, and e-cigarettes. If you need help quitting, ask your health care provider.  Keep all follow-up appointments with your health care provider. This is important. How is this prevented? Take these actions to decrease your risk of developing another PE:  Exercise regularly. For at least 30 minutes every day, engage in:  Activity that involves moving your arms and legs.  Activity that encourages good blood flow through your body by increasing your heart rate.  Exercise your arms and legs every hour during long-distance travel (over 4 hours). Drink plenty of water and avoid drinking alcohol while traveling.  Avoid sitting or lying in bed for long periods of time without moving your legs.  Maintain a weight that is appropriate for your height. Ask your health care provider what weight is healthy for you.  If you are a woman who is over 3 years of age, avoid unnecessary use of medicines that contain estrogen. These include birth control pills.  Do not smoke, especially if you take estrogen medicines. If you need help quitting, ask your health care provider.  If you are at very high risk for  PE, wear compression stockings.  If you recently had a PE, have regularly scheduled ultrasound testing on your legs to check for new blood clots. If you are hospitalized, prevention measures may include:  Early walking after surgery, as soon as your health care provider says that it is safe.  Receiving anticoagulants to prevent blood clots. If you cannot take anticoagulants, other options may be available, such as wearing compression stockings or using different types of devices. Get help right away if:  You have new or increased pain, swelling, or redness in an arm or leg.  You have numbness or tingling in an arm or leg.  You have shortness of breath while active or at rest.  You have chest pain.  You have a rapid or irregular heartbeat.  You feel light-headed or dizzy.  You cough up blood.  You notice blood in your vomit, bowel movement, or urine.  You have a fever. These symptoms may represent a serious problem that is an emergency. Do not wait to see if the symptoms will go away. Get medical help right away. Call your local emergency services (911 in the [place] Do not drive yourself to the hospital. This information is not intended to replace advice given to you by your health care provider. Make sure you discuss any questions you have with your health care provider. Document Released: 11/25/2000 Document Revised: 05/05/2016 Document Reviewed: 03/25/2015 Elsevier Interactive Patient Education  2017 [place] on my medicine - Coumadin   (Warfarin)  This medication education was reviewed with me or my healthcare representative as part of my discharge preparation.  The pharmacist that spoke with me during my hospital stay was:  Deboraha Sprang, [facility]  Why was Coumadin prescribed for you? Coumadin was prescribed for you because you have a blood clot or a medical condition that can cause an increased risk of forming blood clots. Blood clots can cause  serious health problems by blocking the flow of blood to the heart, lung, or brain. Coumadin can prevent harmful blood clots from forming. As a reminder your indication for Coumadin is:   Pulmonary Embolism Treatment  What test will check on my response to Coumadin? While on Coumadin (warfarin) you will need to have an INR test regularly to ensure that your dose is keeping you in the desired range. The INR (international normalized ratio) number is calculated from the result of the laboratory test called prothrombin time (PT).  If an INR APPOINTMENT HAS NOT ALREADY BEEN MADE FOR YOU please schedule an appointment to have this lab work done by your health care provider within 7 days. Your INR goal is usually a number between:  2 to 3 or your provider may give you a more narrow range like 2-2.5.  Ask your health care provider during an office visit what your goal INR is.  What  do you need to  know  About  COUMADIN? Take Coumadin (warfarin) exactly as prescribed by your healthcare provider about the same time each day.  DO NOT stop taking without talking to the doctor who prescribed the medication.  Stopping without other blood clot prevention medication to take the place of Coumadin may increase your risk of developing a new clot or stroke.  Get refills before you run out.  What do you do if you miss a dose? If you miss a dose, take it as soon as you remember on the same day then continue your regularly scheduled regimen the next day.  Do not take two doses of Coumadin at the same time.  Important Safety Information A possible side effect of Coumadin (Warfarin) is an increased risk of bleeding. You should call your healthcare provider right away if you experience any of the following: ? Bleeding from an injury or your nose that does not stop. ? Unusual colored urine (red or dark brown) or unusual colored stools (red or black). ? Unusual bruising for unknown reasons. ? A serious fall or if you hit  your head (even if there is no bleeding).  Some foods or medicines interact with Coumadin (warfarin) and might alter your response to warfarin. To help avoid this: ? Eat a balanced diet, maintaining a consistent amount of Vitamin K. ? Notify your provider about major diet changes you plan to make. ? Avoid alcohol or limit your intake to 1 drink for women and 2 drinks for men  per day. (1 drink is 5 oz. wine, 12 oz. beer, or 1.5 oz. liquor.)  Make sure that ANY health care provider who prescribes medication for you knows that you are taking Coumadin (warfarin).  Also make sure the healthcare provider who is monitoring your Coumadin knows when you have started a new medication including herbals and non-prescription products.  Coumadin (Warfarin)  Major Drug Interactions  Increased Warfarin Effect Decreased Warfarin Effect  Alcohol (large quantities) Antibiotics (esp. Septra/Bactrim, Flagyl, Cipro) Amiodarone (Cordarone) Aspirin (ASA) Cimetidine (Tagamet) Megestrol (Megace) NSAIDs (ibuprofen, naproxen, etc.) Piroxicam (Feldene) Propafenone (Rythmol SR) Propranolol (Inderal) Isoniazid (INH) Posaconazole (Noxafil) Barbiturates (Phenobarbital) Carbamazepine (Tegretol) Chlordiazepoxide (Librium) Cholestyramine (Questran) Griseofulvin Oral Contraceptives Rifampin Sucralfate (Carafate) Vitamin K   Coumadin (Warfarin) Major Herbal Interactions  Increased Warfarin Effect Decreased Warfarin Effect  Garlic Ginseng Ginkgo biloba Coenzyme Q10 Green tea St. Johns wort    Coumadin (Warfarin) FOOD Interactions  Eat a consistent number of servings per week of foods HIGH in Vitamin K (1 serving =  cup)  Collards (cooked, or boiled & drained) Kale (cooked, or boiled & drained) Mustard greens (cooked, or boiled & drained) Parsley *serving size only =  cup Spinach (cooked, or boiled & drained) Swiss chard (cooked, or boiled & drained) Turnip greens (cooked, or boiled & drained)  Eat  a consistent number of servings per week of foods MEDIUM-HIGH in Vitamin K (1 serving = 1 cup)  Asparagus (cooked, or boiled & drained) Broccoli (cooked, boiled & drained, or raw & chopped) Brussel sprouts (cooked, or boiled & drained) *serving size only =  cup Lettuce, raw (green leaf, endive, romaine) Spinach, raw Turnip greens, raw & chopped   These websites have more information on Coumadin (warfarin):  FailFactory.se; VeganReport.com.au;

## 2017-03-04 NOTE — Consult Note (Signed)
ANTICOAGULATION CONSULT NOTE - Follow Up Consult  Pharmacy Consult for Heparin and Coumadin Indication: pulmonary embolus and DVT  Allergies  Allergen Reactions  . Codeine Nausea And Vomiting  . Latex   . Penicillins Rash    Has patient had a PCN reaction causing immediate rash, facial/tongue/throat swelling, SOB or lightheadedness with hypotension: Yes Has patient had a PCN reaction causing severe rash involving mucus membranes or skin necrosis: No Has patient had a PCN reaction that required hospitalization No Has patient had a PCN reaction occurring within the last 10 years: Yes If all of the above answers are "NO", then may proceed with Cephalosporin use.   . Sulfa Antibiotics Rash    Patient Measurements: Height: 5\' 5"  (165.1 cm) Weight: 155 lb 3.3 oz (70.4 kg) IBW/kg (Calculated) : 57 Heparin Dosing Weight: 67.1kg  Vital Signs: Temp: 97.8 F (36.6 C) (03/24 0627) Temp Source: Oral (03/24 0627) BP: 146/78 (03/24 0908) Pulse Rate: 86 (03/24 0908)  Labs:  Recent Labs  03/02/17 0504 03/02/17 1028 03/03/17 0302 03/04/17 0219  HGB 12.0  --  12.7 12.2  HCT 36.5  --  38.3 36.4  PLT 115*  --  148* 153  LABPROT  --   --  13.5 16.2*  INR  --   --  1.03 1.29  HEPARINUNFRC 0.49  --  0.48 0.42  CREATININE  --  0.90 0.87  --     Estimated Creatinine Clearance: 48.3 mL/min (by C-G formula based on SCr of 0.87 mg/dL).  Medications: Heparin @ 1000 units/hr  Assessment: 85yof continues on heparin for bilateral PE with RHS s/p EKOS (completed 3/21) and RLE DVT. Heparin level is therapeutic at 0.42. Coumadin started 3/22. INR 1.29. CBC stable. No bleeding.  Goal of Therapy:  INR 2-3 Heparin level 0.3-0.7 units/ml Monitor platelets by anticoagulation protocol: Yes  Plan:  1) Continue heparin at 1000 units/hr 2) Coumadin 5mg  x 1 3) Daily heparin level, INR, CBC 4) Continue heparin for a minimum of 5 days and until INR is > 2 for a minimum of 24 hours   Nena Jordan, PharmD, BCPS 03/04/2017 11:33 AM

## 2017-03-04 NOTE — Progress Notes (Signed)
[facility] PROGRESS NOTE  Jacqueline Tate TKP:[id number] DOB: 1931/01/15 DOA: 02/28/2017  PCP: Marcello Fennel, MD  Brief History/Interval Summary: 83 year old Caucasian female with past medical history of hypertension, depression, hypothyroidism, skin cancer, presented to her cardiologist with complaints of chest pain which was thought to be atypical. She went subsequently to the emergency department at [facility] for further evaluation. She underwent CT scanning of her chest, which revealed bilateral pulmonary embolism with right heart strain. She was transferred over to [facility] and underwent catheter directed thrombolysis with EKOS. She was stabilized and then transferred to the floor.  Reason for Visit: Acute pulmonary embolism  Consultants: Seen by pulmonology and interventional radiology.  Procedures:  Catheter directed thrombolysis with EKOS  Transthoracic echocardiogram Study Conclusions  - Left ventricle: The cavity size was normal. Wall thickness was   increased in a pattern of mild LVH. Systolic function was normal.   The estimated ejection fraction was in the range of 60% to 65%.   Wall motion was normal; there were no regional wall motion   abnormalities. Doppler parameters are consistent with abnormal   left ventricular relaxation (grade 1 diastolic dysfunction). - Ventricular septum: D-shaped interventricular septum suggesting   RV pressure/volume overload. - Aortic valve: There was no stenosis. - Mitral valve: There was no significant regurgitation. - Right ventricle: The cavity size was moderately to severely   dilated. Systolic function was mildly reduced. - Right atrium: The atrium was mildly dilated. - Tricuspid valve: Peak RV-RA gradient (S): 32 mm Hg. - Systemic veins: IVC was not visualized. - Pericardium, extracardiac: A trivial pericardial effusion was   identified.  Impressions:  - Normal LV size with mild LV  hypertrophy, EF 60-65%. D-shaped   interventricular septum suggestive of RV pressure/volume   overload. Moderate to severely dilated RV with mildly decreased   systolic function. Mild pulmonary hypertension.   Antibiotics: None  Subjective/Interval History: Patient feels well this morning. Denies any chest pain, shortness of breath. Denies any headaches. Her daughter is at the bedside.  ROS: Denies any nausea or vomiting.  Objective:  Vital Signs  Vitals:   03/03/17 2009 03/04/17 0248 03/04/17 0627 03/04/17 0908  BP: (!) 153/87  136/81 (!) 146/78  Pulse: 91  80 86  Resp: 18  18 18   Temp: 98.3 F (36.8 C)  97.8 F (36.6 C)   TempSrc: Oral  Oral   SpO2: 95%  94% 94%  Weight:  70.4 kg (155 lb 3.3 oz)    Height:        Intake/Output Summary (Last 24 hours) at 03/04/17 1143 Last data filed at 03/04/17 0830  Gross per 24 hour  Intake             1050 ml  Output                0 ml  Net             1050 ml   Filed Weights   03/02/17 0318 03/03/17 0500 03/04/17 0248  Weight: 71.5 kg (157 lb 10.1 oz) 69.4 kg (153 lb) 70.4 kg (155 lb 3.3 oz)    General appearance: alert, cooperative, appears stated age and no distress Resp: Few Crackles at the bases. No wheezing, rales or rhonchi. Normal effort. Cardio: regular rate and rhythm, S1, S2 normal, no murmur, click, rub or gallop GI: soft, non-tender; bowel sounds normal; no masses,  no organomegaly Extremities: Right leg noted to be slightly larger  compared to the left. Neurologic: No focal deficits noted.  Lab Results:  Data Reviewed: I have personally reviewed following labs and imaging studies  CBC:  Recent Labs Lab 03/01/17 0540 03/01/17 0930 03/02/17 0504 03/03/17 0302 03/04/17 0219  WBC 6.3 6.9 4.6 6.3 5.3  HGB 12.8 12.8 12.0 12.7 12.2  HCT 37.4 37.6 36.5 38.3 36.4  MCV 92.1 91.7 94.6 93.4 93.8  PLT 141* 150 115* 148* 315    Basic Metabolic Panel:  Recent Labs Lab 02/28/17 1415 03/01/17 0026  03/02/17 1028 03/03/17 0302  NA 135 139 137 140  K 4.3 3.4* 4.1 4.1  CL 106 108 105 105  CO2 21* 19* 22 24  GLUCOSE 187* 124* 200* 106*  BUN 29* 20 15 15   CREATININE 0.99 0.75 0.90 0.87  CALCIUM 8.8* 8.4* 9.0 9.3  MG  --  1.9  --   --   PHOS  --  3.3  --   --     GFR: Estimated Creatinine Clearance: 48.3 mL/min (by C-G formula based on SCr of 0.87 mg/dL).  Coagulation Profile:  Recent Labs Lab 02/28/17 1421 03/03/17 0302 03/04/17 0219  INR 1.11 1.03 1.29    Cardiac Enzymes:  Recent Labs Lab 02/28/17 1415 02/28/17 2340 03/01/17 0333 03/01/17 0930  TROPONINI 0.06* 0.15* 0.15* 0.09*    CBG:  Recent Labs Lab 03/03/17 0308 03/03/17 0714 03/03/17 1215 03/04/17 0625 03/04/17 1112  GLUCAP 100* 94 99 102* 110*     Recent Results (from the past 240 hour(s))  MRSA PCR Screening     Status: None   Collection Time: 02/28/17  6:00 PM  Result Value Ref Range Status   MRSA by PCR NEGATIVE NEGATIVE Final    Comment:        The GeneXpert MRSA Assay (FDA approved for NASAL specimens only), is one component of a comprehensive MRSA colonization surveillance program. It is not intended to diagnose MRSA infection nor to guide or monitor treatment for MRSA infections.       Radiology Studies: No results found.   Medications:  Scheduled: . cyclobenzaprine  5 mg Oral QHS  . docusate sodium  100 mg Oral Daily  . DULoxetine  60 mg Oral Daily  . gabapentin  600 mg Oral TID  . levothyroxine  50 mcg Oral QAC breakfast  . loratadine  10 mg Oral Daily  . metoCLOPramide  5 mg Oral Daily  . metoprolol tartrate  25 mg Oral BID  . pantoprazole  40 mg Oral Q1200  . sodium chloride flush  3 mL Intravenous Q12H  . warfarin  5 mg Oral ONCE-1800  . warfarin   Does not apply Once  . Warfarin - Pharmacist Dosing Inpatient   Does not apply q1800   Continuous: . heparin 1,000 Units/hr (03/03/17 1023)   QMG:[id number], ALPRAZolam, bisacodyl, ondansetron (ZOFRAN) IV,  sodium chloride flush, zolpidem  Assessment/Plan:  Active Problems:   Pulmonary embolism (HCC)    Bilateral Pulmonary Emboli with evidence of right heart strain S/p catheter directed lysis with EKOS 3/20. Patient is currently on heparin infusion. Warfarin was initiated. Await therapeutic INR. She will need repeat echocardiogram in 4-6 months.  Small segment of DVT in right distal gastrocnemius. Patient reports a fall recently which resulted in her being more sedentary than usual. This could have been the reason for her DVT and subsequent PE.  Elevated Troponin Troponins were mildly elevated, most likely due to right heart strain from PE. Echocardiogram showed normal systolic function. No wall  motion abnormalities were noted. No further workup at this time.  History of essential hypertension.  Currently only on metoprolol. Blood pressures could be better controlled. Patient was initially hypotensive and so her medications were held. Could place her back on her lisinopril.    Hx of hypothyroidism. Continue synthroid  Anxiety, insomnia. Continue preadmission xanax, flexeril, duloxetine, gabapentin.  Hx of hiatal hernia. Stable.  DVT Prophylaxis: On heparin and warfarin    Code Status: Full code  Family Communication: Discussed with the patient and her daughter  Disposition Plan: Management as outlined above. Await therapeutic INR. Continue to mobilize.    LOS: 4 days   [facility] Pager [phone] 03/04/2017, 11:43 AM  If 7PM-7AM, please contact night-coverage at [email or web address], password [facility]

## 2017-03-04 NOTE — Progress Notes (Signed)
Patient ambulated 364ft with this RN in the hall using a rolling walker on room air, ambulation well tolerated , patient returned back to bed, bed in lowest position, call bell within reach, will continue to monitor.

## 2017-03-05 DIAGNOSIS — I2699 Other pulmonary embolism without acute cor pulmonale: Secondary | ICD-10-CM

## 2017-03-05 LAB — CBC
HEMATOCRIT: 37.3 % (ref 36.0–46.0)
HEMOGLOBIN: 12.6 g/dL (ref 12.0–15.0)
MCH: 31.5 pg (ref 26.0–34.0)
MCHC: 33.8 g/dL (ref 30.0–36.0)
MCV: 93.3 fL (ref 78.0–100.0)
Platelets: 176 10*3/uL (ref 150–400)
RBC: 4 MIL/uL (ref 3.87–5.11)
RDW: 14.1 % (ref 11.5–15.5)
WBC: 6.4 10*3/uL (ref 4.0–10.5)

## 2017-03-05 LAB — PROTIME-INR
INR: 1.69
Prothrombin Time: 20.1 seconds — ABNORMAL HIGH (ref 11.4–15.2)

## 2017-03-05 LAB — GLUCOSE, CAPILLARY
GLUCOSE-CAPILLARY: 112 mg/dL — AB (ref 65–99)
Glucose-Capillary: 112 mg/dL — ABNORMAL HIGH (ref 65–99)
Glucose-Capillary: 103 mg/dL — ABNORMAL HIGH (ref 65–99)

## 2017-03-05 LAB — HEPARIN LEVEL (UNFRACTIONATED): Heparin Unfractionated: 0.49 IU/mL (ref 0.30–0.70)

## 2017-03-05 MED ORDER — WARFARIN SODIUM 5 MG PO TABS
5.0000 mg | ORAL_TABLET | Freq: Once | ORAL | Status: AC
  Administered 2017-03-05: 5 mg via ORAL
  Filled 2017-03-05: qty 1

## 2017-03-05 NOTE — Progress Notes (Signed)
[facility] PROGRESS NOTE  Jacqueline Tate EHU:[id number] DOB: [birth year] DOA: 02/28/2017  PCP: Marcello Fennel, MD  Brief History/Interval Summary: 83 year old Caucasian female with past medical history of hypertension, depression, hypothyroidism, skin cancer, presented to her cardiologist with complaints of chest pain which was thought to be atypical. She went subsequently to the emergency department at [facility] for further evaluation. She underwent CT scanning of her chest, which revealed bilateral pulmonary embolism with right heart strain. She was transferred over to [facility] and underwent catheter directed thrombolysis with EKOS. She was stabilized and then transferred to the floor.  Reason for Visit: Acute pulmonary embolism  Consultants: Seen by pulmonology and interventional radiology.  Procedures:  Catheter directed thrombolysis with EKOS  Transthoracic echocardiogram Study Conclusions  - Left ventricle: The cavity size was normal. Wall thickness was   increased in a pattern of mild LVH. Systolic function was normal.   The estimated ejection fraction was in the range of 60% to 65%.   Wall motion was normal; there were no regional wall motion   abnormalities. Doppler parameters are consistent with abnormal   left ventricular relaxation (grade 1 diastolic dysfunction). - Ventricular septum: D-shaped interventricular septum suggesting   RV pressure/volume overload. - Aortic valve: There was no stenosis. - Mitral valve: There was no significant regurgitation. - Right ventricle: The cavity size was moderately to severely   dilated. Systolic function was mildly reduced. - Right atrium: The atrium was mildly dilated. - Tricuspid valve: Peak RV-RA gradient (S): 32 mm Hg. - Systemic veins: IVC was not visualized. - Pericardium, extracardiac: A trivial pericardial effusion was   identified.  Impressions:  - Normal LV size with mild LV  hypertrophy, EF 60-65%. D-shaped   interventricular septum suggestive of RV pressure/volume   overload. Moderate to severely dilated RV with mildly decreased   systolic function. Mild pulmonary hypertension.   Antibiotics: None  Subjective/Interval History: Patient complains of some discomfort around the right ear area. No drainage. She does have hearing impairment at baseline. Denies any chest pain or shortness of breath. Her daughter is at the bedside.  ROS: Denies any nausea or vomiting.  Objective:  Vital Signs  Vitals:   03/04/17 1439 03/04/17 2118 03/05/17 0503 03/05/17 0509  BP: 129/72 (!) 144/75  128/81  Pulse: 83 85  85  Resp: 18 18  18   Temp: 98.1 F (36.7 C) 98.5 F (36.9 C)  98.6 F (37 C)  TempSrc: Oral Oral  Oral  SpO2: 98% 96%  95%  Weight:   69.1 kg (152 lb 4.8 oz)   Height:        Intake/Output Summary (Last 24 hours) at 03/05/17 7858 Last data filed at 03/05/17 0900  Gross per 24 hour  Intake             1080 ml  Output              685 ml  Net              395 ml   Filed Weights   03/03/17 0500 03/04/17 0248 03/05/17 0503  Weight: 69.4 kg (153 lb) 70.4 kg (155 lb 3.3 oz) 69.1 kg (152 lb 4.8 oz)    General appearance: alert, cooperative, appears stated age and no distress No obvious deformity noted around the right ear. No obvious drainage present. No discomfort currently. If recurs, we will do otoscopic examination. Resp: Good air entry. Few crackles in the bases.. No wheezing,  rales or rhonchi. Normal effort. Cardio: regular rate and rhythm, S1, S2 normal, no murmur, click, rub or gallop GI: soft, non-tender; bowel sounds normal; no masses,  no organomegaly Extremities: Right leg noted to be slightly larger compared to the left. Neurologic: No focal deficits noted.  Lab Results:  Data Reviewed: I have personally reviewed following labs and imaging studies  CBC:  Recent Labs Lab 03/01/17 0930 03/02/17 0504 03/03/17 0302 03/04/17 0219  03/05/17 0230  WBC 6.9 4.6 6.3 5.3 6.4  HGB 12.8 12.0 12.7 12.2 12.6  HCT 37.6 36.5 38.3 36.4 37.3  MCV 91.7 94.6 93.4 93.8 93.3  PLT 150 115* 148* 153 119    Basic Metabolic Panel:  Recent Labs Lab 02/28/17 1415 03/01/17 0026 03/02/17 1028 03/03/17 0302  NA 135 139 137 140  K 4.3 3.4* 4.1 4.1  CL 106 108 105 105  CO2 21* 19* 22 24  GLUCOSE 187* 124* 200* 106*  BUN 29* 20 15 15   CREATININE 0.99 0.75 0.90 0.87  CALCIUM 8.8* 8.4* 9.0 9.3  MG  --  1.9  --   --   PHOS  --  3.3  --   --     GFR: Estimated Creatinine Clearance: 47.8 mL/min (by C-G formula based on SCr of 0.87 mg/dL).  Coagulation Profile:  Recent Labs Lab 02/28/17 1421 03/03/17 0302 03/04/17 0219 03/05/17 0230  INR 1.11 1.03 1.29 1.69    Cardiac Enzymes:  Recent Labs Lab 02/28/17 1415 02/28/17 2340 03/01/17 0333 03/01/17 0930  TROPONINI 0.06* 0.15* 0.15* 0.09*    CBG:  Recent Labs Lab 03/03/17 1215 03/04/17 0625 03/04/17 1112 03/04/17 1639 03/05/17 0622  GLUCAP 99 102* 110* 108* 112*     Recent Results (from the past 240 hour(s))  MRSA PCR Screening     Status: None   Collection Time: 02/28/17  6:00 PM  Result Value Ref Range Status   MRSA by PCR NEGATIVE NEGATIVE Final    Comment:        The GeneXpert MRSA Assay (FDA approved for NASAL specimens only), is one component of a comprehensive MRSA colonization surveillance program. It is not intended to diagnose MRSA infection nor to guide or monitor treatment for MRSA infections.       Radiology Studies: No results found.   Medications:  Scheduled: . cyclobenzaprine  5 mg Oral QHS  . docusate sodium  100 mg Oral Daily  . DULoxetine  60 mg Oral Daily  . gabapentin  600 mg Oral TID  . levothyroxine  50 mcg Oral QAC breakfast  . lisinopril  20 mg Oral Daily  . loratadine  10 mg Oral Daily  . metoCLOPramide  5 mg Oral Daily  . metoprolol tartrate  25 mg Oral BID  . pantoprazole  40 mg Oral Q1200  . sodium  chloride flush  3 mL Intravenous Q12H  . warfarin   Does not apply Once  . Warfarin - Pharmacist Dosing Inpatient   Does not apply q1800   Continuous: . heparin 1,000 Units/hr (03/04/17 1217)   JYN:[id number], ALPRAZolam, bisacodyl, ondansetron (ZOFRAN) IV, sodium chloride flush, zolpidem  Assessment/Plan:  Active Problems:   Pulmonary embolism (HCC)    Bilateral Pulmonary Emboli with evidence of right heart strain S/p catheter directed lysis with EKOS 3/20. Patient is currently on heparin infusion. Warfarin was initiated. Await therapeutic INR. She will need repeat echocardiogram in 4-6 months.  Small segment of DVT in right distal gastrocnemius. Patient reports a fall recently which resulted in  her being more sedentary than usual. This could have been the reason for her DVT and subsequent PE.  Elevated Troponin Troponins were mildly elevated, most likely due to right heart strain from PE. Echocardiogram showed normal systolic function. No wall motion abnormalities were noted. No further workup at this time.  History of essential hypertension.  Blood pressures could be better controlled. Patient was initially hypotensive and so her medications were held. Patient was on metoprolol. Lisinopril was also resumed. Blood pressure is better. Continue to monitor.   Hx of hypothyroidism. Continue synthroid  Anxiety, insomnia. Continue preadmission xanax, flexeril, duloxetine, gabapentin.  Hx of hiatal hernia. Stable.  DVT Prophylaxis: On heparin and warfarin    Code Status: Full code  Family Communication: Discussed with the patient and her daughter  Disposition Plan: Management as outlined above. Await therapeutic INR. Continue to mobilize. Will need referral to Coumadin clinic for continued management of her anticoagulation.    LOS: 5 days   [facility] Pager [phone] 03/05/2017, 9:28 AM  If 7PM-7AM, please contact night-coverage at  [email or web address], password [facility]

## 2017-03-05 NOTE — Consult Note (Signed)
ANTICOAGULATION CONSULT NOTE - Follow Up Consult  Pharmacy Consult for Heparin and Coumadin Indication: pulmonary embolus and DVT  Allergies  Allergen Reactions  . Codeine Nausea And Vomiting  . Latex   . Penicillins Rash    Has patient had a PCN reaction causing immediate rash, facial/tongue/throat swelling, SOB or lightheadedness with hypotension: Yes Has patient had a PCN reaction causing severe rash involving mucus membranes or skin necrosis: No Has patient had a PCN reaction that required hospitalization No Has patient had a PCN reaction occurring within the last 10 years: Yes If all of the above answers are "NO", then may proceed with Cephalosporin use.   . Sulfa Antibiotics Rash    Patient Measurements: Height: 5\' 5"  (165.1 cm) Weight: 152 lb 4.8 oz (69.1 kg) IBW/kg (Calculated) : 57 Heparin Dosing Weight: 67.1kg  Vital Signs: Temp: 98.6 F (37 C) (03/25 0509) Temp Source: Oral (03/25 0509) BP: 128/81 (03/25 0509) Pulse Rate: 85 (03/25 0509)  Labs:  Recent Labs  03/03/17 0302 03/04/17 0219 03/05/17 0230  HGB 12.7 12.2 12.6  HCT 38.3 36.4 37.3  PLT 148* 153 176  LABPROT 13.5 16.2* 20.1*  INR 1.03 1.29 1.69  HEPARINUNFRC 0.48 0.42 0.49  CREATININE 0.87  --   --     Estimated Creatinine Clearance: 47.8 mL/min (by C-G formula based on SCr of 0.87 mg/dL).  Medications: Heparin @ 1000 units/hr  Assessment: 85yof continues on heparin for bilateral PE with RHS s/p EKOS (completed 3/21) and RLE DVT. Heparin level is therapeutic at 0.49. Coumadin started 3/22. INR trending up nicely with 5mg  doses to 1.69. CBC stable. No bleeding.  Goal of Therapy:  INR 2-3 Heparin level 0.3-0.7 units/ml Monitor platelets by anticoagulation protocol: Yes  Plan:  1) Continue heparin at 1000 units/hr 2) Coumadin 5mg  x 1 3) Daily heparin level, INR, CBC 4) Continue heparin for a minimum of 5 days and until INR is > 2 for a minimum of 24 hours   Nena Jordan, PharmD,  BCPS 03/05/2017 10:45 AM

## 2017-03-06 LAB — HEPARIN LEVEL (UNFRACTIONATED): HEPARIN UNFRACTIONATED: 0.42 [IU]/mL (ref 0.30–0.70)

## 2017-03-06 LAB — BASIC METABOLIC PANEL
ANION GAP: 10 (ref 5–15)
BUN: 20 mg/dL (ref 6–20)
CO2: 24 mmol/L (ref 22–32)
Calcium: 9.2 mg/dL (ref 8.9–10.3)
Chloride: 100 mmol/L — ABNORMAL LOW (ref 101–111)
Creatinine, Ser: 0.99 mg/dL (ref 0.44–1.00)
GFR, EST AFRICAN AMERICAN: 59 mL/min — AB (ref >60)
GFR, EST NON AFRICAN AMERICAN: 51 mL/min — AB (ref >60)
Glucose, Bld: 110 mg/dL — ABNORMAL HIGH (ref 65–99)
Potassium: 3.9 mmol/L (ref 3.5–5.1)
Sodium: 134 mmol/L — ABNORMAL LOW (ref 135–145)

## 2017-03-06 LAB — GLUCOSE, CAPILLARY
GLUCOSE-CAPILLARY: 111 mg/dL — AB (ref 65–99)
GLUCOSE-CAPILLARY: 99 mg/dL (ref 65–99)
Glucose-Capillary: 158 mg/dL — ABNORMAL HIGH (ref 65–99)
Glucose-Capillary: 136 mg/dL — ABNORMAL HIGH (ref 65–99)

## 2017-03-06 LAB — CBC
HCT: 34.7 % — ABNORMAL LOW (ref 36.0–46.0)
Hemoglobin: 11.7 g/dL — ABNORMAL LOW (ref 12.0–15.0)
MCH: 31.5 pg (ref 26.0–34.0)
MCHC: 33.7 g/dL (ref 30.0–36.0)
MCV: 93.3 fL (ref 78.0–100.0)
PLATELETS: 160 10*3/uL (ref 150–400)
RBC: 3.72 MIL/uL — ABNORMAL LOW (ref 3.87–5.11)
RDW: 14.3 % (ref 11.5–15.5)
WBC: 5.6 10*3/uL (ref 4.0–10.5)

## 2017-03-06 LAB — PROTIME-INR
INR: 2.47
Prothrombin Time: 27.2 seconds — ABNORMAL HIGH (ref 11.4–15.2)

## 2017-03-06 NOTE — Consult Note (Signed)
ANTICOAGULATION CONSULT NOTE - Follow Up Consult  Pharmacy Consult for Heparin and Coumadin Indication: pulmonary embolus and DVT  Allergies  Allergen Reactions  . Codeine Nausea And Vomiting  . Latex   . Penicillins Rash    Has patient had a PCN reaction causing immediate rash, facial/tongue/throat swelling, SOB or lightheadedness with hypotension: Yes Has patient had a PCN reaction causing severe rash involving mucus membranes or skin necrosis: No Has patient had a PCN reaction that required hospitalization No Has patient had a PCN reaction occurring within the last 10 years: Yes If all of the above answers are "NO", then may proceed with Cephalosporin use.   . Sulfa Antibiotics Rash   Patient Measurements: Height: 5\' 5"  (165.1 cm) Weight: 152 lb 4.8 oz (69.1 kg) IBW/kg (Calculated) : 57 Heparin Dosing Weight: 67.1kg  Vital Signs: Temp: 98.3 F (36.8 C) (03/26 0622) Temp Source: Oral (03/26 0622) BP: 111/59 (03/26 0622) Pulse Rate: 75 (03/26 0622)  Labs:  Recent Labs  03/04/17 0219 03/05/17 0230 03/06/17 0301  HGB 12.2 12.6 11.7*  HCT 36.4 37.3 34.7*  PLT 153 176 160  LABPROT 16.2* 20.1* 27.2*  INR 1.29 1.69 2.47  HEPARINUNFRC 0.42 0.49 0.42  CREATININE  --   --  0.99   Estimated Creatinine Clearance: 42 mL/min (by C-G formula based on SCr of 0.99 mg/dL).  Medications: Heparin @ 1000 units/hr  Assessment: 85yof continues on heparin for bilateral PE with RHS s/p EKOS (completed 3/21) and RLE DVT. Heparin level is therapeutic at 0.42. Coumadin started 3/22. INR trended up slowly for first three days, then had large increase 1.69>2.47 overnight. Anticipating the level will continue to trend up today, therefore will hold dose tonight. HgB down slightly, no bleeding reported per RN.   Goal of Therapy:  INR 2-3 Heparin level 0.3-0.7 units/ml Monitor platelets by anticoagulation protocol: Yes  Plan:  1) Continue heparin at 1000 units/hr 2) HOLD Coumadin  tonight 3) Daily heparin level, INR, CBC 4) Continue heparin for one more day  Georga Bora, PharmD Clinical Pharmacist Pager: [phone] 03/06/2017 8:08 AM

## 2017-03-06 NOTE — Care Management Important Message (Signed)
Important Message  Patient Details  Name: Jacqueline Tate MRN: [id number] Date of Birth: [birth year]   Medicare Important Message Given:  Yes    Nathen May 03/06/2017, 3:38 PM

## 2017-03-06 NOTE — Progress Notes (Signed)
   03/06/17 1020  Clinical Encounter Type  Visited With Patient and family together  Visit Type Spiritual support  Spiritual Encounters  Spiritual Needs Prayer  Stress Factors  Patient Stress Factors None identified  Family Stress Factors None identified  Husband walking in hall requested chaplain to pray for wife. Introduction in room. Offered prayer of comfort and hope.

## 2017-03-06 NOTE — Progress Notes (Signed)
[facility] PROGRESS NOTE  Jacqueline Tate QQP:[id number] DOB: 08-12-1931 DOA: 02/28/2017  PCP: Marcello Fennel, MD  Brief History/Interval Summary: 83 year old Caucasian female with past medical history of hypertension, depression, hypothyroidism, skin cancer, presented to her cardiologist with complaints of chest pain which was thought to be atypical. She went subsequently to the emergency department at [facility] for further evaluation. She underwent CT scanning of her chest, which revealed bilateral pulmonary embolism with right heart strain. She was transferred over to [facility] and underwent catheter directed thrombolysis with EKOS. She was stabilized and then transferred to the floor.  Reason for Visit: Acute pulmonary embolism  Consultants: Seen by pulmonology and interventional radiology.  Procedures:  Catheter directed thrombolysis with EKOS  Transthoracic echocardiogram Study Conclusions  - Left ventricle: The cavity size was normal. Wall thickness was   increased in a pattern of mild LVH. Systolic function was normal.   The estimated ejection fraction was in the range of 60% to 65%.   Wall motion was normal; there were no regional wall motion   abnormalities. Doppler parameters are consistent with abnormal   left ventricular relaxation (grade 1 diastolic dysfunction). - Ventricular septum: D-shaped interventricular septum suggesting   RV pressure/volume overload. - Aortic valve: There was no stenosis. - Mitral valve: There was no significant regurgitation. - Right ventricle: The cavity size was moderately to severely   dilated. Systolic function was mildly reduced. - Right atrium: The atrium was mildly dilated. - Tricuspid valve: Peak RV-RA gradient (S): 32 mm Hg. - Systemic veins: IVC was not visualized. - Pericardium, extracardiac: A trivial pericardial effusion was   identified.  Impressions:  - Normal LV size with mild LV  hypertrophy, EF 60-65%. D-shaped   interventricular septum suggestive of RV pressure/volume   overload. Moderate to severely dilated RV with mildly decreased   systolic function. Mild pulmonary hypertension.   Antibiotics: None  Subjective/Interval History: Patient states that her right ear feels much better this morning. Hasn't had any drainage. Otherwise she feels well. No chest pain or shortness of breath.   ROS: Denies any nausea or vomiting.  Objective:  Vital Signs  Vitals:   03/05/17 1239 03/05/17 2123 03/06/17 0622 03/06/17 0813  BP: 105/65 138/75 (!) 111/59 134/80  Pulse: 83 80 75 74  Resp: 18 18 18    Temp: 98 F (36.7 C) 98.9 F (37.2 C) 98.3 F (36.8 C)   TempSrc: Oral Oral Oral   SpO2: 97% 100% 100%   Weight:   69.1 kg (152 lb 4.8 oz)   Height:        Intake/Output Summary (Last 24 hours) at 03/06/17 0941 Last data filed at 03/06/17 0618  Gross per 24 hour  Intake           1046.5 ml  Output              900 ml  Net            146.5 ml   Filed Weights   03/04/17 0248 03/05/17 0503 03/06/17 0622  Weight: 70.4 kg (155 lb 3.3 oz) 69.1 kg (152 lb 4.8 oz) 69.1 kg (152 lb 4.8 oz)    General appearance: alert, cooperative, appears stated age and no distress No obvious deformity noted around the right ear. No obvious drainage present. No discomfort currently. Resp: Good air entry. Few crackles in the bases.. No wheezing, rales or rhonchi. Normal effort. Cardio: regular rate and rhythm, S1, S2 normal, no murmur,  click, rub or gallop GI: soft, non-tender; bowel sounds normal; no masses,  no organomegaly Extremities: Right leg noted to be slightly larger compared to the left. Neurologic: No focal deficits noted.  Lab Results:  Data Reviewed: I have personally reviewed following labs and imaging studies  CBC:  Recent Labs Lab 03/02/17 0504 03/03/17 0302 03/04/17 0219 03/05/17 0230 03/06/17 0301  WBC 4.6 6.3 5.3 6.4 5.6  HGB 12.0 12.7 12.2 12.6 11.7*   HCT 36.5 38.3 36.4 37.3 34.7*  MCV 94.6 93.4 93.8 93.3 93.3  PLT 115* 148* 153 176 417    Basic Metabolic Panel:  Recent Labs Lab 02/28/17 1415 03/01/17 0026 03/02/17 1028 03/03/17 0302 03/06/17 0301  NA 135 139 137 140 134*  K 4.3 3.4* 4.1 4.1 3.9  CL 106 108 105 105 100*  CO2 21* 19* 22 24 24   GLUCOSE 187* 124* 200* 106* 110*  BUN 29* 20 15 15 20   CREATININE 0.99 0.75 0.90 0.87 0.99  CALCIUM 8.8* 8.4* 9.0 9.3 9.2  MG  --  1.9  --   --   --   PHOS  --  3.3  --   --   --     GFR: Estimated Creatinine Clearance: 42 mL/min (by C-G formula based on SCr of 0.99 mg/dL).  Coagulation Profile:  Recent Labs Lab 02/28/17 1421 03/03/17 0302 03/04/17 0219 03/05/17 0230 03/06/17 0301  INR 1.11 1.03 1.29 1.69 2.47    Cardiac Enzymes:  Recent Labs Lab 02/28/17 1415 02/28/17 2340 03/01/17 0333 03/01/17 0930  TROPONINI 0.06* 0.15* 0.15* 0.09*    CBG:  Recent Labs Lab 03/04/17 1639 03/05/17 0622 03/05/17 1117 03/05/17 1621 03/06/17 0617  GLUCAP 108* 112* 103* 112* 111*     Recent Results (from the past 240 hour(s))  MRSA PCR Screening     Status: None   Collection Time: 02/28/17  6:00 PM  Result Value Ref Range Status   MRSA by PCR NEGATIVE NEGATIVE Final    Comment:        The GeneXpert MRSA Assay (FDA approved for NASAL specimens only), is one component of a comprehensive MRSA colonization surveillance program. It is not intended to diagnose MRSA infection nor to guide or monitor treatment for MRSA infections.       Radiology Studies: No results found.   Medications:  Scheduled: . cyclobenzaprine  5 mg Oral QHS  . docusate sodium  100 mg Oral Daily  . DULoxetine  60 mg Oral Daily  . gabapentin  600 mg Oral TID  . levothyroxine  50 mcg Oral QAC breakfast  . lisinopril  20 mg Oral Daily  . loratadine  10 mg Oral Daily  . metoCLOPramide  5 mg Oral Daily  . metoprolol tartrate  25 mg Oral BID  . pantoprazole  40 mg Oral Q1200  .  sodium chloride flush  3 mL Intravenous Q12H  . warfarin   Does not apply Once  . Warfarin - Pharmacist Dosing Inpatient   Does not apply q1800   Continuous: . heparin 1,000 Units/hr (03/05/17 1337)   EYC:[id number], ALPRAZolam, bisacodyl, ondansetron (ZOFRAN) IV, sodium chloride flush, zolpidem  Assessment/Plan:  Active Problems:   Pulmonary embolism (HCC)    Bilateral Pulmonary Emboli with evidence of right heart strain S/p catheter directed lysis with EKOS 3/20. Patient is currently on heparin infusion. Warfarin was initiated. INR is therapeutic today. Will need another 24 hours of overlap with heparin. She will need repeat echocardiogram in 4-6 months. She will need  PT/INR at her PCPs office on Thursday or Friday. She will also need follow-up with her cardiologist. Both of these providers are at [facility].  Small segment of DVT in right distal gastrocnemius. Patient reports a fall recently which resulted in her being more sedentary than usual. This could have been the reason for her DVT and subsequent PE.  Elevated Troponin Troponins were mildly elevated, most likely due to right heart strain from PE. Echocardiogram showed normal systolic function. No wall motion abnormalities were noted. No further workup at this time.  History of essential hypertension.  Patient was initially hypotensive and so her medications were held. Patient was on metoprolol. Lisinopril was also resumed. Blood pressure is better. Continue to monitor.   Hx of hypothyroidism. Continue synthroid  Anxiety, insomnia. Continue preadmission xanax, flexeril, duloxetine, gabapentin.  Hx of hiatal hernia. Stable.  DVT Prophylaxis: On heparin and warfarin    Code Status: Full code  Family Communication: Discussed with the patient and her daughter  Disposition Plan: Management as outlined above. INR is therapeutic today. She needs 24-hour overlap with heparin. And it discharged tomorrow. We will need  PT/INR check on Thursday or Friday.     LOS: 6 days   [facility] Pager [phone] 03/06/2017, 9:41 AM  If 7PM-7AM, please contact night-coverage at [email or web address], password [facility]

## 2017-03-07 LAB — PROTIME-INR
INR: 2.55
PROTHROMBIN TIME: 27.9 s — AB (ref 11.4–15.2)

## 2017-03-07 LAB — CBC
HCT: 35.5 % — ABNORMAL LOW (ref 36.0–46.0)
Hemoglobin: 11.8 g/dL — ABNORMAL LOW (ref 12.0–15.0)
MCH: 31.1 pg (ref 26.0–34.0)
MCHC: 33.2 g/dL (ref 30.0–36.0)
MCV: 93.4 fL (ref 78.0–100.0)
Platelets: 182 10*3/uL (ref 150–400)
RBC: 3.8 MIL/uL — ABNORMAL LOW (ref 3.87–5.11)
RDW: 14.1 % (ref 11.5–15.5)
WBC: 6 10*3/uL (ref 4.0–10.5)

## 2017-03-07 LAB — GLUCOSE, CAPILLARY
GLUCOSE-CAPILLARY: 89 mg/dL (ref 65–99)
Glucose-Capillary: 110 mg/dL — ABNORMAL HIGH (ref 65–99)

## 2017-03-07 LAB — HEPARIN LEVEL (UNFRACTIONATED): HEPARIN UNFRACTIONATED: 0.47 [IU]/mL (ref 0.30–0.70)

## 2017-03-07 MED ORDER — WARFARIN SODIUM 2.5 MG PO TABS: 2.5000 mg | ORAL_TABLET | Freq: Every day | ORAL | 0 refills | Status: DC

## 2017-03-07 MED ORDER — WARFARIN SODIUM 2.5 MG PO TABS: 2.5000 mg | ORAL_TABLET | Freq: Once | ORAL | Status: DC

## 2017-03-07 NOTE — Progress Notes (Signed)
Discharge instructions given to patient and family, questions answered.  Telemetry  &  IV site discontinued. CCMD aware of patient discharge.  Mervyn Skeeters, RN

## 2017-03-07 NOTE — Care Management Note (Signed)
Case Management Note Previous CM note initiated by Maryclare Labrador, RN 03/01/2017, 3:35 PM   Patient Details  Name: Jacqueline Tate MRN: [id number] Date of Birth: Apr 18, 1931  Subjective/Objective:     Pt admitted with PE               Action/Plan:   PTA from home with husband and active. Pt is now s/p EKOS.  CM will continue to follow for discharge needs   Expected Discharge Date:  03/07/17               Expected Discharge Plan:  Home/Self Care  In-House Referral:     Discharge planning Services  CM Consult  Post Acute Care Choice:    Choice offered to:     DME Arranged:    DME Agency:     HH Arranged:    HH Agency:     Status of Service:  Completed, signed off  If discussed at [employer] of Stay Meetings, dates discussed:    Additional Comments:  03/07/17- 1400- Jacqueline Vignola RN,, CM- pt for d/c home today- will have outpt f/u for INR - no further CM needs noted for discharge.   Dahlia Client [place], RN 03/07/2017, 2:16 PM [phone]

## 2017-03-07 NOTE — Discharge Summary (Signed)
[facility]  Physician Discharge Summary   Patient ID: LAURAJEAN HOSEK MRN: [id number] DOB/AGE: [birth year] 83 y.o.  Admit date: 02/28/2017 Discharge date: 03/07/2017  PCP: BABAOFFCaryl Bis, MD  DISCHARGE DIAGNOSES:  Active Problems:   Pulmonary embolism (HCC)   RECOMMENDATIONS FOR OUTPATIENT FOLLOW UP: 1. PT/INR to be checked at her primary care physician's office on Thursday. 2. Will need repeat echocardiogram in 3-6 months   DISCHARGE CONDITION: fair  Diet recommendation: Heart healthy  Filed Weights   03/05/17 0503 03/06/17 0622 03/07/17 0415  Weight: 69.1 kg (152 lb 4.8 oz) 69.1 kg (152 lb 4.8 oz) 69.1 kg (152 lb 6.4 oz)    INITIAL HISTORY:  83 year old Caucasian female with past medical history of hypertension, depression, hypothyroidism, skin cancer, presented to her cardiologist with complaints of chest pain which was thought to be atypical. She went subsequently to the emergency department at [facility] for further evaluation. She underwent CT scanning of her chest, which revealed bilateral pulmonary embolism with right heart strain. She was transferred over to [facility] and underwent catheter directed thrombolysis with EKOS. She was stabilized and then transferred to the floor.  Consultants: Seen by pulmonology and interventional radiology.  Procedures:  Catheter directed thrombolysis with EKOS  Transthoracic echocardiogram Study Conclusions  - Left ventricle: The cavity size was normal. Wall thickness was increased in a pattern of mild LVH. Systolic function was normal. The estimated ejection fraction was in the range of 60% to 65%. Wall motion was normal; there were no regional wall motion abnormalities. Doppler parameters are consistent with abnormal left ventricular relaxation (grade 1 diastolic dysfunction). - Ventricular septum: D-shaped interventricular septum suggesting RV pressure/volume overload. -  Aortic valve: There was no stenosis. - Mitral valve: There was no significant regurgitation. - Right ventricle: The cavity size was moderately to severely dilated. Systolic function was mildly reduced. - Right atrium: The atrium was mildly dilated. - Tricuspid valve: Peak RV-RA gradient (S): 32 mm Hg. - Systemic veins: IVC was not visualized. - Pericardium, extracardiac: A trivial pericardial effusion was identified.  Impressions:  - Normal LV size with mild LV hypertrophy, EF 60-65%. D-shaped interventricular septum suggestive of RV pressure/volume overload. Moderate to severely dilated RV with mildly decreased systolic function. Mild pulmonary hypertension.   HOSPITAL COURSE:   Bilateral Pulmonary Emboli with evidence of right heart strain Patient was hospitalized. She was seen by pulmonology and interventional radiology. She is S/p catheter directed lysis with EKOS 3/20. Patient was started on heparin infusion. Warfarin was initiated. INR has been therapeutic for 24 hours. PT/INR to be checked at her primary care physician's office on Thursday. I called the office myself to arrange this. She already has a follow-up appointment with her cardiologist. She will need repeat echocardiogram in 3-6 months.  Small segment of DVT in right distal gastrocnemius. Patient reports a fall recently which resulted in her being more sedentary than usual. This could have been the reason for her DVT and subsequent PE. Treatment as discussed above.  Elevated Troponin Troponins were mildly elevated, most likely due to right heart strain from PE. Echocardiogram showed normal systolic function. No wall motion abnormalities were noted. No further workup at this time.  History of essential hypertension.  Patient was initially hypotensive and so her medications were held. Continue lisinopril and metoprolol. Hold amlodipine for now. This can be pursued further as outpatient.  Hx of  hypothyroidism. Continue synthroid  Anxiety, insomnia. Continue home medications  Hx of hiatal hernia. Stable.  Overall, stable and improved. She has been ambulating the hallway without any difficulty. Okay for discharge home today.   PERTINENT LABS:  The results of significant diagnostics from this hospitalization (including imaging, microbiology, ancillary and laboratory) are listed below for reference.    Microbiology: Recent Results (from the past 240 hour(s))  MRSA PCR Screening     Status: None   Collection Time: 02/28/17  6:00 PM  Result Value Ref Range Status   MRSA by PCR NEGATIVE NEGATIVE Final    Comment:        The GeneXpert MRSA Assay (FDA approved for NASAL specimens only), is one component of a comprehensive MRSA colonization surveillance program. It is not intended to diagnose MRSA infection nor to guide or monitor treatment for MRSA infections.      Labs: Basic Metabolic Panel:  Recent Labs Lab 03/01/17 0026 03/02/17 1028 03/03/17 0302 03/06/17 0301  NA 139 137 140 134*  K 3.4* 4.1 4.1 3.9  CL 108 105 105 100*  CO2 19* 22 24 24   GLUCOSE 124* 200* 106* 110*  BUN 20 15 15 20   CREATININE 0.75 0.90 0.87 0.99  CALCIUM 8.4* 9.0 9.3 9.2  MG 1.9  --   --   --   PHOS 3.3  --   --   --    CBC:  Recent Labs Lab 03/03/17 0302 03/04/17 0219 03/05/17 0230 03/06/17 0301 03/07/17 0444  WBC 6.3 5.3 6.4 5.6 6.0  HGB 12.7 12.2 12.6 11.7* 11.8*  HCT 38.3 36.4 37.3 34.7* 35.5*  MCV 93.4 93.8 93.3 93.3 93.4  PLT 148* 153 176 160 182   Cardiac Enzymes:  Recent Labs Lab 02/28/17 2340 03/01/17 0333 03/01/17 0930  TROPONINI 0.15* 0.15* 0.09*   BNP: BNP (last 3 results)  Recent Labs  02/28/17 1415  BNP 1,202.0*    CBG:  Recent Labs Lab 03/06/17 0617 03/06/17 1104 03/06/17 1651 03/07/17 0657 03/07/17 1127  GLUCAP 111* 136* 99 110* 89     IMAGING STUDIES Ct Angio Chest Pe W And/or Wo Contrast  Result Date:  02/28/2017 CLINICAL DATA:  83 year old female with shortness of breath. Initial encounter. EXAM: CT ANGIOGRAPHY CHEST WITH CONTRAST TECHNIQUE: Multidetector CT imaging of the chest was performed using the standard protocol during bolus administration of intravenous contrast. Multiplanar CT image reconstructions and MIPs were obtained to evaluate the vascular anatomy. CONTRAST:  75 cc Isovue 370. COMPARISON:  None. FINDINGS: Cardiovascular: Large bilateral pulmonary emboli with evidence of right heart strain. Coronary artery calcifications. Atherosclerotic changes thoracic aorta. Exam not optimized to evaluate the aorta although no obvious dissection. Mediastinum/Nodes: Moderately large hiatal hernia. Lungs/Pleura: Hazy parenchymal changes peripheral aspect right lower lobe possibly chronic or related to pulmonary embolus. Scarring/subsegmental atelectasis left lower lobe/lingula. Upper Abdomen: Prominent contrast hepatic veins may be related to increased right heart pressure. Musculoskeletal: Degenerative changes lower thoracic/ upper lumbar spine and lower cervical spine. Review of the MIP images confirms the above findings. IMPRESSION: Positive for acute large bilateral pulmonary emboli with CT evidence of right heart strain (RV/LV Ratio = 1.6) consistent with at least submassive (intermediate risk) PE. The presence of right heart strain has been associated with an increased risk of morbidity and mortality. Please activate Code PE by paging [phone]. Aortic atherosclerosis. Moderately large hiatal hernia. Hazy parenchymal changes peripheral aspect right lower lobe possibly chronic or related to pulmonary embolus. Scarring/subsegmental atelectasis left lower lobe/lingula. These results were called by telephone at the time of interpretation on 02/28/2017 at 3:14  pm to Dr. Rudene Re , who verbally acknowledged these results. Electronically Signed   By: Genia Del M.D.   On: 02/28/2017 15:24   Ir  Angiogram Pulmonary Bilateral Selective  Result Date: 03/01/2017 INDICATION: 82 year old with large bilateral pulmonary emboli and right heart strain. Patient is a candidate for ultrasound assisted pulmonary emboli thrombolysis. EXAM: PULMONARY ARTERIOGRAPHY WITH SELECTIVE ANGIOGRAPHY OF BILATERAL PULMONARY ARTERIES PLACEMENT OF BILATERAL EKOS INFUSION CATHETERS IN PULMONARY ARTERIES ULTRASOUND GUIDANCE FOR VASCULAR ACCESS X 2 COMPARISON:  Chest CTA 02/28/2017 MEDICATIONS: None. ANESTHESIA/SEDATION: Versed 0.5 mg IV; Fentanyl 25 mcg IV Moderate Sedation Time:  45 minutes The patient was continuously monitored during the procedure by the interventional radiology nurse under my direct supervision. FLUOROSCOPY TIME:  Fluoroscopy Time: 11 minutes 24 seconds (45 mGy). CONTRAST:  25 mL [id number] COMPLICATIONS: None immediate. TECHNIQUE: Informed written consent was obtained from the patient after a thorough discussion of the procedural risks, benefits and alternatives. All questions were addressed. Maximal Sterile Barrier Technique was utilized including caps, mask, sterile gowns, sterile gloves, sterile drape, hand hygiene and skin antiseptic. A timeout was performed prior to the initiation of the procedure. Patient was placed supine. Ultrasound demonstrated a large patent right internal jugular vein. The right side of the neck was prepped and draped in sterile fashion. Skin was anesthetized with 1% lidocaine. Using ultrasound guidance, 21 gauge needle was directed into the right internal jugular vein and a micropuncture dilator set was placed. A second micropuncture dilator set was placed with ultrasound guidance just below the other catheter. Subsequently, the micro catheters were upsized to 6 [place] vascular sheaths. C2 catheter and Bentson wire were advanced through the heart and placed in the right pulmonary artery. Right pulmonary artery pressure and angiography performed. Catheter was advanced into a right  lower lobe pulmonary artery and a Rosen wire was placed. C2 was again used to navigate the heart and was advanced into the left pulmonary artery. Left pulmonary artery pressure and arteriogram was performed. Catheter was then advanced into a left lower lobe pulmonary artery using a Glidewire. A second Rosen wire was placed. A 12 cm infusion length EKOS catheter was advanced over the Rosen wire and positioned in the left pulmonary artery. Small amount of contrast was injected in the lower lobe pulmonary artery to confirm adequate positioning. A second 12 cm EKOS catheter was advanced over the other Rosen wire and advanced into the right lower lobe pulmonary artery. Again, a small amount of contrast was injected to confirm adequate placement. Both sheaths were sutured to the skin. The infusion catheters were secured to the sheaths. Patient was transferred to the ICU and the tPA infusion was started through both catheters. Fluoroscopic and ultrasound images were taken and saved for documentation. FINDINGS: Right pulmonary artery pressure:  51/25, mean of 35 Left pulmonary artery pressure:  48/21, mean of 33 Infusion catheters placed in the bilateral lower lobe pulmonary arteries. IMPRESSION: Successful placement of bilateral pulmonary artery infusion EKOS catheters. Plan for ultrasound assisted pulmonary embolism thrombolysis through bilateral catheters. Plan for 1 mg of tPA through each catheter for 12 hours, total dose of 24 mg. Plan to recheck pulmonary artery pressures following the 12 hour infusion. Elevated pulmonary artery pressures as described. Electronically Signed   By: Markus Daft M.D.   On: 03/01/2017 08:22   Ir Angiogram Selective Each Additional Vessel  Result Date: 03/01/2017 INDICATION: 83 year old with large bilateral pulmonary emboli and right heart strain. Patient is a candidate for ultrasound assisted pulmonary emboli  thrombolysis. EXAM: PULMONARY ARTERIOGRAPHY WITH SELECTIVE ANGIOGRAPHY OF  BILATERAL PULMONARY ARTERIES PLACEMENT OF BILATERAL EKOS INFUSION CATHETERS IN PULMONARY ARTERIES ULTRASOUND GUIDANCE FOR VASCULAR ACCESS X 2 COMPARISON:  Chest CTA 02/28/2017 MEDICATIONS: None. ANESTHESIA/SEDATION: Versed 0.5 mg IV; Fentanyl 25 mcg IV Moderate Sedation Time:  45 minutes The patient was continuously monitored during the procedure by the interventional radiology nurse under my direct supervision. FLUOROSCOPY TIME:  Fluoroscopy Time: 11 minutes 24 seconds (45 mGy). CONTRAST:  25 mL [id number] COMPLICATIONS: None immediate. TECHNIQUE: Informed written consent was obtained from the patient after a thorough discussion of the procedural risks, benefits and alternatives. All questions were addressed. Maximal Sterile Barrier Technique was utilized including caps, mask, sterile gowns, sterile gloves, sterile drape, hand hygiene and skin antiseptic. A timeout was performed prior to the initiation of the procedure. Patient was placed supine. Ultrasound demonstrated a large patent right internal jugular vein. The right side of the neck was prepped and draped in sterile fashion. Skin was anesthetized with 1% lidocaine. Using ultrasound guidance, 21 gauge needle was directed into the right internal jugular vein and a micropuncture dilator set was placed. A second micropuncture dilator set was placed with ultrasound guidance just below the other catheter. Subsequently, the micro catheters were upsized to 6 [place] vascular sheaths. C2 catheter and Bentson wire were advanced through the heart and placed in the right pulmonary artery. Right pulmonary artery pressure and angiography performed. Catheter was advanced into a right lower lobe pulmonary artery and a Rosen wire was placed. C2 was again used to navigate the heart and was advanced into the left pulmonary artery. Left pulmonary artery pressure and arteriogram was performed. Catheter was then advanced into a left lower lobe pulmonary artery using a Glidewire. A  second Rosen wire was placed. A 12 cm infusion length EKOS catheter was advanced over the Rosen wire and positioned in the left pulmonary artery. Small amount of contrast was injected in the lower lobe pulmonary artery to confirm adequate positioning. A second 12 cm EKOS catheter was advanced over the other Rosen wire and advanced into the right lower lobe pulmonary artery. Again, a small amount of contrast was injected to confirm adequate placement. Both sheaths were sutured to the skin. The infusion catheters were secured to the sheaths. Patient was transferred to the ICU and the tPA infusion was started through both catheters. Fluoroscopic and ultrasound images were taken and saved for documentation. FINDINGS: Right pulmonary artery pressure:  51/25, mean of 35 Left pulmonary artery pressure:  48/21, mean of 33 Infusion catheters placed in the bilateral lower lobe pulmonary arteries. IMPRESSION: Successful placement of bilateral pulmonary artery infusion EKOS catheters. Plan for ultrasound assisted pulmonary embolism thrombolysis through bilateral catheters. Plan for 1 mg of tPA through each catheter for 12 hours, total dose of 24 mg. Plan to recheck pulmonary artery pressures following the 12 hour infusion. Elevated pulmonary artery pressures as described. Electronically Signed   By: Markus Daft M.D.   On: 03/01/2017 08:22   Ir Angiogram Selective Each Additional Vessel  Result Date: 03/01/2017 INDICATION: 83 year old with large bilateral pulmonary emboli and right heart strain. Patient is a candidate for ultrasound assisted pulmonary emboli thrombolysis. EXAM: PULMONARY ARTERIOGRAPHY WITH SELECTIVE ANGIOGRAPHY OF BILATERAL PULMONARY ARTERIES PLACEMENT OF BILATERAL EKOS INFUSION CATHETERS IN PULMONARY ARTERIES ULTRASOUND GUIDANCE FOR VASCULAR ACCESS X 2 COMPARISON:  Chest CTA 02/28/2017 MEDICATIONS: None. ANESTHESIA/SEDATION: Versed 0.5 mg IV; Fentanyl 25 mcg IV Moderate Sedation Time:  45 minutes The patient  was continuously monitored during  the procedure by the interventional radiology nurse under my direct supervision. FLUOROSCOPY TIME:  Fluoroscopy Time: 11 minutes 24 seconds (45 mGy). CONTRAST:  25 mL [id number] COMPLICATIONS: None immediate. TECHNIQUE: Informed written consent was obtained from the patient after a thorough discussion of the procedural risks, benefits and alternatives. All questions were addressed. Maximal Sterile Barrier Technique was utilized including caps, mask, sterile gowns, sterile gloves, sterile drape, hand hygiene and skin antiseptic. A timeout was performed prior to the initiation of the procedure. Patient was placed supine. Ultrasound demonstrated a large patent right internal jugular vein. The right side of the neck was prepped and draped in sterile fashion. Skin was anesthetized with 1% lidocaine. Using ultrasound guidance, 21 gauge needle was directed into the right internal jugular vein and a micropuncture dilator set was placed. A second micropuncture dilator set was placed with ultrasound guidance just below the other catheter. Subsequently, the micro catheters were upsized to 6 [place] vascular sheaths. C2 catheter and Bentson wire were advanced through the heart and placed in the right pulmonary artery. Right pulmonary artery pressure and angiography performed. Catheter was advanced into a right lower lobe pulmonary artery and a Rosen wire was placed. C2 was again used to navigate the heart and was advanced into the left pulmonary artery. Left pulmonary artery pressure and arteriogram was performed. Catheter was then advanced into a left lower lobe pulmonary artery using a Glidewire. A second Rosen wire was placed. A 12 cm infusion length EKOS catheter was advanced over the Rosen wire and positioned in the left pulmonary artery. Small amount of contrast was injected in the lower lobe pulmonary artery to confirm adequate positioning. A second 12 cm EKOS catheter was advanced over  the other Rosen wire and advanced into the right lower lobe pulmonary artery. Again, a small amount of contrast was injected to confirm adequate placement. Both sheaths were sutured to the skin. The infusion catheters were secured to the sheaths. Patient was transferred to the ICU and the tPA infusion was started through both catheters. Fluoroscopic and ultrasound images were taken and saved for documentation. FINDINGS: Right pulmonary artery pressure:  51/25, mean of 35 Left pulmonary artery pressure:  48/21, mean of 33 Infusion catheters placed in the bilateral lower lobe pulmonary arteries. IMPRESSION: Successful placement of bilateral pulmonary artery infusion EKOS catheters. Plan for ultrasound assisted pulmonary embolism thrombolysis through bilateral catheters. Plan for 1 mg of tPA through each catheter for 12 hours, total dose of 24 mg. Plan to recheck pulmonary artery pressures following the 12 hour infusion. Elevated pulmonary artery pressures as described. Electronically Signed   By: Markus Daft M.D.   On: 03/01/2017 08:22   Ir US Guide Vasc Access Right  Result Date: 03/01/2017 INDICATION: 83 year old with large bilateral pulmonary emboli and right heart strain. Patient is a candidate for ultrasound assisted pulmonary emboli thrombolysis. EXAM: PULMONARY ARTERIOGRAPHY WITH SELECTIVE ANGIOGRAPHY OF BILATERAL PULMONARY ARTERIES PLACEMENT OF BILATERAL EKOS INFUSION CATHETERS IN PULMONARY ARTERIES ULTRASOUND GUIDANCE FOR VASCULAR ACCESS X 2 COMPARISON:  Chest CTA 02/28/2017 MEDICATIONS: None. ANESTHESIA/SEDATION: Versed 0.5 mg IV; Fentanyl 25 mcg IV Moderate Sedation Time:  45 minutes The patient was continuously monitored during the procedure by the interventional radiology nurse under my direct supervision. FLUOROSCOPY TIME:  Fluoroscopy Time: 11 minutes 24 seconds (45 mGy). CONTRAST:  25 mL [id number] COMPLICATIONS: None immediate. TECHNIQUE: Informed written consent was obtained from the patient after  a thorough discussion of the procedural risks, benefits and alternatives. All questions were addressed. Maximal Sterile  Barrier Technique was utilized including caps, mask, sterile gowns, sterile gloves, sterile drape, hand hygiene and skin antiseptic. A timeout was performed prior to the initiation of the procedure. Patient was placed supine. Ultrasound demonstrated a large patent right internal jugular vein. The right side of the neck was prepped and draped in sterile fashion. Skin was anesthetized with 1% lidocaine. Using ultrasound guidance, 21 gauge needle was directed into the right internal jugular vein and a micropuncture dilator set was placed. A second micropuncture dilator set was placed with ultrasound guidance just below the other catheter. Subsequently, the micro catheters were upsized to 6 [place] vascular sheaths. C2 catheter and Bentson wire were advanced through the heart and placed in the right pulmonary artery. Right pulmonary artery pressure and angiography performed. Catheter was advanced into a right lower lobe pulmonary artery and a Rosen wire was placed. C2 was again used to navigate the heart and was advanced into the left pulmonary artery. Left pulmonary artery pressure and arteriogram was performed. Catheter was then advanced into a left lower lobe pulmonary artery using a Glidewire. A second Rosen wire was placed. A 12 cm infusion length EKOS catheter was advanced over the Rosen wire and positioned in the left pulmonary artery. Small amount of contrast was injected in the lower lobe pulmonary artery to confirm adequate positioning. A second 12 cm EKOS catheter was advanced over the other Rosen wire and advanced into the right lower lobe pulmonary artery. Again, a small amount of contrast was injected to confirm adequate placement. Both sheaths were sutured to the skin. The infusion catheters were secured to the sheaths. Patient was transferred to the ICU and the tPA infusion was started  through both catheters. Fluoroscopic and ultrasound images were taken and saved for documentation. FINDINGS: Right pulmonary artery pressure:  51/25, mean of 35 Left pulmonary artery pressure:  48/21, mean of 33 Infusion catheters placed in the bilateral lower lobe pulmonary arteries. IMPRESSION: Successful placement of bilateral pulmonary artery infusion EKOS catheters. Plan for ultrasound assisted pulmonary embolism thrombolysis through bilateral catheters. Plan for 1 mg of tPA through each catheter for 12 hours, total dose of 24 mg. Plan to recheck pulmonary artery pressures following the 12 hour infusion. Elevated pulmonary artery pressures as described. Electronically Signed   By: Markus Daft M.D.   On: 03/01/2017 08:22   Ir Infusion Thrombol Arterial Initial (ms)  Result Date: 03/01/2017 INDICATION: 83 year old with large bilateral pulmonary emboli and right heart strain. Patient is a candidate for ultrasound assisted pulmonary emboli thrombolysis. EXAM: PULMONARY ARTERIOGRAPHY WITH SELECTIVE ANGIOGRAPHY OF BILATERAL PULMONARY ARTERIES PLACEMENT OF BILATERAL EKOS INFUSION CATHETERS IN PULMONARY ARTERIES ULTRASOUND GUIDANCE FOR VASCULAR ACCESS X 2 COMPARISON:  Chest CTA 02/28/2017 MEDICATIONS: None. ANESTHESIA/SEDATION: Versed 0.5 mg IV; Fentanyl 25 mcg IV Moderate Sedation Time:  45 minutes The patient was continuously monitored during the procedure by the interventional radiology nurse under my direct supervision. FLUOROSCOPY TIME:  Fluoroscopy Time: 11 minutes 24 seconds (45 mGy). CONTRAST:  25 mL [id number] COMPLICATIONS: None immediate. TECHNIQUE: Informed written consent was obtained from the patient after a thorough discussion of the procedural risks, benefits and alternatives. All questions were addressed. Maximal Sterile Barrier Technique was utilized including caps, mask, sterile gowns, sterile gloves, sterile drape, hand hygiene and skin antiseptic. A timeout was performed prior to the initiation  of the procedure. Patient was placed supine. Ultrasound demonstrated a large patent right internal jugular vein. The right side of the neck was prepped and draped in sterile fashion. Skin  was anesthetized with 1% lidocaine. Using ultrasound guidance, 21 gauge needle was directed into the right internal jugular vein and a micropuncture dilator set was placed. A second micropuncture dilator set was placed with ultrasound guidance just below the other catheter. Subsequently, the micro catheters were upsized to 6 [place] vascular sheaths. C2 catheter and Bentson wire were advanced through the heart and placed in the right pulmonary artery. Right pulmonary artery pressure and angiography performed. Catheter was advanced into a right lower lobe pulmonary artery and a Rosen wire was placed. C2 was again used to navigate the heart and was advanced into the left pulmonary artery. Left pulmonary artery pressure and arteriogram was performed. Catheter was then advanced into a left lower lobe pulmonary artery using a Glidewire. A second Rosen wire was placed. A 12 cm infusion length EKOS catheter was advanced over the Rosen wire and positioned in the left pulmonary artery. Small amount of contrast was injected in the lower lobe pulmonary artery to confirm adequate positioning. A second 12 cm EKOS catheter was advanced over the other Rosen wire and advanced into the right lower lobe pulmonary artery. Again, a small amount of contrast was injected to confirm adequate placement. Both sheaths were sutured to the skin. The infusion catheters were secured to the sheaths. Patient was transferred to the ICU and the tPA infusion was started through both catheters. Fluoroscopic and ultrasound images were taken and saved for documentation. FINDINGS: Right pulmonary artery pressure:  51/25, mean of 35 Left pulmonary artery pressure:  48/21, mean of 33 Infusion catheters placed in the bilateral lower lobe pulmonary arteries. IMPRESSION:  Successful placement of bilateral pulmonary artery infusion EKOS catheters. Plan for ultrasound assisted pulmonary embolism thrombolysis through bilateral catheters. Plan for 1 mg of tPA through each catheter for 12 hours, total dose of 24 mg. Plan to recheck pulmonary artery pressures following the 12 hour infusion. Elevated pulmonary artery pressures as described. Electronically Signed   By: Markus Daft M.D.   On: 03/01/2017 08:22   Ir Infusion Thrombol Arterial Initial (ms)  Result Date: 03/01/2017 INDICATION: 83 year old with large bilateral pulmonary emboli and right heart strain. Patient is a candidate for ultrasound assisted pulmonary emboli thrombolysis. EXAM: PULMONARY ARTERIOGRAPHY WITH SELECTIVE ANGIOGRAPHY OF BILATERAL PULMONARY ARTERIES PLACEMENT OF BILATERAL EKOS INFUSION CATHETERS IN PULMONARY ARTERIES ULTRASOUND GUIDANCE FOR VASCULAR ACCESS X 2 COMPARISON:  Chest CTA 02/28/2017 MEDICATIONS: None. ANESTHESIA/SEDATION: Versed 0.5 mg IV; Fentanyl 25 mcg IV Moderate Sedation Time:  45 minutes The patient was continuously monitored during the procedure by the interventional radiology nurse under my direct supervision. FLUOROSCOPY TIME:  Fluoroscopy Time: 11 minutes 24 seconds (45 mGy). CONTRAST:  25 mL [id number] COMPLICATIONS: None immediate. TECHNIQUE: Informed written consent was obtained from the patient after a thorough discussion of the procedural risks, benefits and alternatives. All questions were addressed. Maximal Sterile Barrier Technique was utilized including caps, mask, sterile gowns, sterile gloves, sterile drape, hand hygiene and skin antiseptic. A timeout was performed prior to the initiation of the procedure. Patient was placed supine. Ultrasound demonstrated a large patent right internal jugular vein. The right side of the neck was prepped and draped in sterile fashion. Skin was anesthetized with 1% lidocaine. Using ultrasound guidance, 21 gauge needle was directed into the right  internal jugular vein and a micropuncture dilator set was placed. A second micropuncture dilator set was placed with ultrasound guidance just below the other catheter. Subsequently, the micro catheters were upsized to 6 [place] vascular sheaths. C2 catheter and Bentson  wire were advanced through the heart and placed in the right pulmonary artery. Right pulmonary artery pressure and angiography performed. Catheter was advanced into a right lower lobe pulmonary artery and a Rosen wire was placed. C2 was again used to navigate the heart and was advanced into the left pulmonary artery. Left pulmonary artery pressure and arteriogram was performed. Catheter was then advanced into a left lower lobe pulmonary artery using a Glidewire. A second Rosen wire was placed. A 12 cm infusion length EKOS catheter was advanced over the Rosen wire and positioned in the left pulmonary artery. Small amount of contrast was injected in the lower lobe pulmonary artery to confirm adequate positioning. A second 12 cm EKOS catheter was advanced over the other Rosen wire and advanced into the right lower lobe pulmonary artery. Again, a small amount of contrast was injected to confirm adequate placement. Both sheaths were sutured to the skin. The infusion catheters were secured to the sheaths. Patient was transferred to the ICU and the tPA infusion was started through both catheters. Fluoroscopic and ultrasound images were taken and saved for documentation. FINDINGS: Right pulmonary artery pressure:  51/25, mean of 35 Left pulmonary artery pressure:  48/21, mean of 33 Infusion catheters placed in the bilateral lower lobe pulmonary arteries. IMPRESSION: Successful placement of bilateral pulmonary artery infusion EKOS catheters. Plan for ultrasound assisted pulmonary embolism thrombolysis through bilateral catheters. Plan for 1 mg of tPA through each catheter for 12 hours, total dose of 24 mg. Plan to recheck pulmonary artery pressures following  the 12 hour infusion. Elevated pulmonary artery pressures as described. Electronically Signed   By: Markus Daft M.D.   On: 03/01/2017 08:22   Ir Jacolyn Reedy F/u Eval Art/ven Final Day (ms)  Result Date: 03/01/2017 INDICATION: 83 year old female with large bilateral pulmonary emboli and recently completed a 12 hour infusion of tPA through bilateral EKOS catheters. Total dose of 24 mg of tPA was given. Patient says that her breathing has improved. No signs of bleeding. EXAM: FOLLOW-UP THROMBOLYTIC THERAPY- FINAL DAY COMPARISON:  None. MEDICATIONS: None. ANESTHESIA/SEDATION: None FLUOROSCOPY TIME:  None COMPLICATIONS: None immediate. TECHNIQUE: Pulmonary artery pressures were obtained from EKOS catheters. Catheters were slightly pulled back so that the pressures were obtained in the main left and right pulmonary arteries. The infusion catheters were completely removed. The right jugular vascular sheaths were removed with manual compression. Bandage placed over the puncture sites. FINDINGS: The mean pulmonary artery pressure in bilateral pulmonary arteries is 20. Mean pulmonary artery pressures prior to thrombolytic therapy was 33 on the left and 35 on the right. IMPRESSION: Decreased pulmonary artery pressures bilaterally. Completion of the pulmonary emboli thrombolysis. Electronically Signed   By: Markus Daft M.D.   On: 03/01/2017 13:29    DISCHARGE EXAMINATION: Vitals:   03/06/17 1342 03/06/17 2058 03/07/17 0415 03/07/17 1039  BP: (!) 137/58 (!) 149/87 130/80 131/79  Pulse: 78 82 78 97  Resp: 18 18 18    Temp: 98.2 F (36.8 C) 99 F (37.2 C) 98.9 F (37.2 C)   TempSrc: Oral Oral Oral   SpO2: 99% 97% 96%   Weight:   69.1 kg (152 lb 6.4 oz)   Height:       General appearance: alert, cooperative, appears stated age and no distress Resp: clear to auscultation bilaterally Cardio: regular rate and rhythm, S1, S2 normal, no murmur, click, rub or gallop GI: soft, non-tender; bowel sounds normal; no masses,   no organomegaly  DISPOSITION: Home with family  Discharge Instructions  Call MD for:  difficulty breathing, headache or visual disturbances    Complete by:  As directed    Call MD for:  extreme fatigue    Complete by:  As directed    Call MD for:  persistant dizziness or light-headedness    Complete by:  As directed    Call MD for:  persistant nausea and vomiting    Complete by:  As directed    Call MD for:  severe uncontrolled pain    Complete by:  As directed    Call MD for:  temperature >100.4    Complete by:  As directed    Discharge instructions    Complete by:  As directed    Please do not take amlodipine (for high pressure) for now. Your doctor should tell you when you can resume.   Discharge instructions    Complete by:  As directed    Please go to Dr. Greggory Brandy office on Thursday, March 29, for INR blood work. They are aware that you will come to the office for this purpose. Please go either between 8 a.m to 12 PM or between 2 PM to 5 PM. Please take her medications as prescribed. We have also been made an appointment to see your cardiologist, but it is not until next week. Please seek attention if you notice bleeding. Seek attention if you notice black colored stool. Also seek attention if you develop lightheadedness, dizziness, chest pain, shortness of breath.  You were cared for by a hospitalist during your hospital stay. If you have any questions about your discharge medications or the care you received while you were in the hospital after you are discharged, you can call the unit and asked to speak with the hospitalist on call if the hospitalist that took care of you is not available. Once you are discharged, your primary care physician will handle any further medical issues. Please note that NO REFILLS for any discharge medications will be authorized once you are discharged, as it is imperative that you return to your primary care physician (or establish a relationship with a  primary care physician if you do not have one) for your aftercare needs so that they can reassess your need for medications and monitor your lab values. If you do not have a primary care physician, you can call [phone] for a physician referral.   Increase activity slowly    Complete by:  As directed       ALLERGIES:  Allergies  Allergen Reactions  . Codeine Nausea And Vomiting  . Latex   . Penicillins Rash    Has patient had a PCN reaction causing immediate rash, facial/tongue/throat swelling, SOB or lightheadedness with hypotension: Yes Has patient had a PCN reaction causing severe rash involving mucus membranes or skin necrosis: No Has patient had a PCN reaction that required hospitalization No Has patient had a PCN reaction occurring within the last 10 years: Yes If all of the above answers are "NO", then may proceed with Cephalosporin use.   . Sulfa Antibiotics Rash     Current Discharge Medication List    START taking these medications   Details  warfarin (COUMADIN) 2.5 MG tablet Take 1 tablet (2.5 mg total) by mouth daily at 6 PM. Qty: 30 tablet, Refills: 0      CONTINUE these medications which have NOT CHANGED   Details  ALPRAZolam (XANAX) 0.25 MG tablet Take 0.25-0.5 mg by mouth at bedtime as needed for anxiety.  bisacodyl (DULCOLAX) 5 MG EC tablet Take 5 mg by mouth daily as needed for moderate constipation.    cyclobenzaprine (FLEXERIL) 5 MG tablet Take 5 mg by mouth at bedtime.    docusate sodium (COLACE) 100 MG capsule Take 100 mg by mouth daily.    DULoxetine (CYMBALTA) 60 MG capsule Take 1 capsule by mouth daily.    fluticasone (FLONASE) 50 MCG/ACT nasal spray Place 1 spray into both nostrils daily as needed.    gabapentin (NEURONTIN) 600 MG tablet Take 1 tablet by mouth 3 (three) times daily.    levothyroxine (SYNTHROID, LEVOTHROID) 50 MCG tablet Take 1 tablet by mouth daily.    lisinopril-hydrochlorothiazide (PRINZIDE,ZESTORETIC) 20-12.5 MG tablet  Take 1 tablet by mouth daily.    loratadine (CLARITIN) 10 MG tablet Take 10 mg by mouth daily.    meloxicam (MOBIC) 7.5 MG tablet Take 1 tablet by mouth 2 (two) times daily.    metoCLOPramide (REGLAN) 10 MG tablet Take 5 mg by mouth daily.    metoprolol (LOPRESSOR) 50 MG tablet Take 1 tablet by mouth 2 (two) times daily.    omeprazole (PRILOSEC) 20 MG capsule Take 20 mg by mouth 2 (two) times daily.     Probiotic Product (PHILLIPS COLON HEALTH PO) Take 1 capsule by mouth daily.    traZODone (DESYREL) 50 MG tablet Take 50 mg by mouth at bedtime.       STOP taking these medications     amLODipine (NORVASC) 5 MG tablet      aspirin 81 MG tablet          Follow-up Information    Teodoro Spray, MD. Go on 03/14/2017.   Specialty:  Cardiology Why:  LAB DRAWS WITH [facility] 4/3 TO INCLUDE INR  AT 8:45 AM-PLEASE GO TO [facility] OFFICE Contact information: [place] [address] [phone]        BABAOFF, Caryl Bis, MD Follow up.   Specialty:  Family Medicine Why:  Please go on thursday to this office either between 8a and 12p or between 2p and 5p for INR blood work.  You appt with Dr. Claudie Revering is on 4/10.  Contact information: [address] [facility] [place] [address] [phone]        Go on 03/07/2017 to follow up.        Teodoro Spray, MD Follow up on 03/17/2017.   Specialty:  Cardiology Why:  APPT IS 4/6 WITH DR [facility]- AT 2 PM Contact information: [place] [address] [phone]           TOTAL DISCHARGE TIME: 41 minutes  [facility] Pager [phone]  03/07/2017, 2:22 PM

## 2017-03-07 NOTE — Consult Note (Signed)
ANTICOAGULATION CONSULT NOTE - Follow Up Consult  Pharmacy Consult for Heparin and Coumadin Indication: pulmonary embolus and DVT  Allergies  Allergen Reactions  . Codeine Nausea And Vomiting  . Latex   . Penicillins Rash    Has patient had a PCN reaction causing immediate rash, facial/tongue/throat swelling, SOB or lightheadedness with hypotension: Yes Has patient had a PCN reaction causing severe rash involving mucus membranes or skin necrosis: No Has patient had a PCN reaction that required hospitalization No Has patient had a PCN reaction occurring within the last 10 years: Yes If all of the above answers are "NO", then may proceed with Cephalosporin use.   . Sulfa Antibiotics Rash   Patient Measurements: Height: 5\' 5"  (165.1 cm) Weight: 152 lb 6.4 oz (69.1 kg) IBW/kg (Calculated) : 57 Heparin Dosing Weight: 67.1kg  Vital Signs: Temp: 98.9 F (37.2 C) (03/27 0415) Temp Source: Oral (03/27 0415) BP: 130/80 (03/27 0415) Pulse Rate: 78 (03/27 0415)  Labs:  Recent Labs  03/05/17 0230 03/06/17 0301 03/07/17 0444  HGB 12.6 11.7* 11.8*  HCT 37.3 34.7* 35.5*  PLT 176 160 182  LABPROT 20.1* 27.2* 27.9*  INR 1.69 2.47 2.55  HEPARINUNFRC 0.49 0.42 0.47  CREATININE  --  0.99  --    Estimated Creatinine Clearance: 42 mL/min (by C-G formula based on SCr of 0.99 mg/dL).  Medications: Heparin @ 1000 units/hr  Assessment: 85yof continues on heparin for bilateral PE with RHS s/p EKOS (completed 3/21) and RLE DVT. Heparin level is therapeutic at 0.47. Coumadin started 3/22. INR trended up slowly for first three days, then had large increase 1.69>2.47. Coumadin held yesterday with INR this morning therapeutic at 2.55. HgB low but stable, no bleeding reported per RN. Recommend stopping heparin today as there has been 5 days of therapeutic heparin levels and two days of therapeutic INR levels.  Goal of Therapy:  INR 2-3 Heparin level 0.3-0.7 units/ml Monitor platelets by  anticoagulation protocol: Yes  Plan:  1) Continue heparin at 1000 units/hr - Can D/C today 2) Coumadin 2.5mg  tonight 3) Daily heparin level, INR, CBC 4) Monitor for s/sx of bleeding  Georga Bora, PharmD Clinical Pharmacist Pager: [phone] 03/07/2017 8:11 AM

## 2017-03-07 NOTE — Progress Notes (Signed)
Heparin drip discontinued now.  IV site  chgd to NSL  Mervyn Skeeters, RN

## 2017-06-28 ENCOUNTER — Emergency Department
Admission: EM | Admit: 2017-06-28 | Discharge: 2017-06-29 | Disposition: A | Discharge status: Home / Self Care | Payer: Medicare Other | Attending: Emergency Medicine | Admitting: Emergency Medicine

## 2017-06-28 ENCOUNTER — Encounter: Payer: Self-pay | Admitting: Emergency Medicine

## 2017-06-28 ENCOUNTER — Emergency Department: Payer: Medicare Other

## 2017-06-28 DIAGNOSIS — S0990XA Unspecified injury of head, initial encounter: Secondary | ICD-10-CM | POA: Diagnosis present

## 2017-06-28 DIAGNOSIS — Z7901 Long term (current) use of anticoagulants: Secondary | ICD-10-CM | POA: Diagnosis not present

## 2017-06-28 DIAGNOSIS — I10 Essential (primary) hypertension: Secondary | ICD-10-CM | POA: Insufficient documentation

## 2017-06-28 DIAGNOSIS — Z79899 Other long term (current) drug therapy: Secondary | ICD-10-CM | POA: Insufficient documentation

## 2017-06-28 DIAGNOSIS — Z791 Long term (current) use of non-steroidal anti-inflammatories (NSAID): Secondary | ICD-10-CM | POA: Diagnosis not present

## 2017-06-28 DIAGNOSIS — W19XXXA Unspecified fall, initial encounter: Secondary | ICD-10-CM

## 2017-06-28 DIAGNOSIS — S0101XA Laceration without foreign body of scalp, initial encounter: Secondary | ICD-10-CM | POA: Insufficient documentation

## 2017-06-28 DIAGNOSIS — Y929 Unspecified place or not applicable: Secondary | ICD-10-CM | POA: Diagnosis not present

## 2017-06-28 DIAGNOSIS — Y999 Unspecified external cause status: Secondary | ICD-10-CM | POA: Insufficient documentation

## 2017-06-28 DIAGNOSIS — Z91040 Latex allergy status: Secondary | ICD-10-CM | POA: Diagnosis not present

## 2017-06-28 DIAGNOSIS — Y93H2 Activity, gardening and landscaping: Secondary | ICD-10-CM | POA: Insufficient documentation

## 2017-06-28 DIAGNOSIS — W01198A Fall on same level from slipping, tripping and stumbling with subsequent striking against other object, initial encounter: Secondary | ICD-10-CM | POA: Diagnosis not present

## 2017-06-28 MED ORDER — SODIUM CHLORIDE 0.9 % IV BOLUS (SEPSIS)
500.0000 mL | Freq: Once | INTRAVENOUS | Status: AC
  Administered 2017-06-29: 500 mL via INTRAVENOUS

## 2017-06-28 MED ORDER — LIDOCAINE-EPINEPHRINE-TETRACAINE (LET) SOLUTION
3.0000 mL | Freq: Once | NASAL | Status: AC
  Administered 2017-06-29: 3 mL via TOPICAL
  Filled 2017-06-28: qty 3

## 2017-06-28 NOTE — ED Triage Notes (Addendum)
Patient was working in her garden and stepped over a tree limb and fell on the tree limb and has laceration to her head. Patient is on coumadin. Bleeding is controled and bandaged placed on head in triage. Pt did not LOC.

## 2017-06-29 DIAGNOSIS — S0101XA Laceration without foreign body of scalp, initial encounter: Secondary | ICD-10-CM | POA: Diagnosis not present

## 2017-06-29 LAB — TROPONIN I: Troponin I: <0.03 ng/mL (ref <0.03)

## 2017-06-29 LAB — CBC
HEMATOCRIT: 41.1 % (ref 35.0–47.0)
Hemoglobin: 14.2 g/dL (ref 12.0–16.0)
MCH: 31.8 pg (ref 26.0–34.0)
MCHC: 34.6 g/dL (ref 32.0–36.0)
MCV: 92 fL (ref 80.0–100.0)
PLATELETS: 198 10*3/uL (ref 150–440)
RBC: 4.47 MIL/uL (ref 3.80–5.20)
RDW: 14.7 % — AB (ref 11.5–14.5)
WBC: 6.1 10*3/uL (ref 3.6–11.0)

## 2017-06-29 LAB — BASIC METABOLIC PANEL
Anion gap: 7 (ref 5–15)
BUN: 17 mg/dL (ref 6–20)
CO2: 28 mmol/L (ref 22–32)
Calcium: 9.6 mg/dL (ref 8.9–10.3)
Chloride: 98 mmol/L — ABNORMAL LOW (ref 101–111)
Creatinine, Ser: 0.83 mg/dL (ref 0.44–1.00)
GFR calc Af Amer: >60 mL/min (ref >60)
GLUCOSE: 103 mg/dL — AB (ref 65–99)
POTASSIUM: 3.6 mmol/L (ref 3.5–5.1)
Sodium: 133 mmol/L — ABNORMAL LOW (ref 135–145)

## 2017-06-29 LAB — PROTIME-INR
INR: 2.36
Prothrombin Time: 26.2 seconds — ABNORMAL HIGH (ref 11.4–15.2)

## 2017-06-29 MED ORDER — ACETAMINOPHEN 325 MG PO TABS
650.0000 mg | ORAL_TABLET | Freq: Once | ORAL | Status: AC
  Administered 2017-06-29: 650 mg via ORAL
  Filled 2017-06-29: qty 2

## 2017-06-29 MED ORDER — BACITRACIN ZINC 500 UNIT/GM EX OINT: TOPICAL_OINTMENT | CUTANEOUS | 0 refills | Status: AC

## 2017-06-29 MED ORDER — LISINOPRIL 10 MG PO TABS
20.0000 mg | ORAL_TABLET | Freq: Once | ORAL | Status: AC
  Administered 2017-06-29: 20 mg via ORAL
  Filled 2017-06-29: qty 2

## 2017-06-29 MED ORDER — BACITRACIN ZINC 500 UNIT/GM EX OINT
TOPICAL_OINTMENT | Freq: Two times a day (BID) | CUTANEOUS | Status: DC
  Administered 2017-06-29: 1 via TOPICAL
  Filled 2017-06-29: qty 0.9

## 2017-06-29 NOTE — Discharge Instructions (Signed)
Please follow up with your primary care physician for removal of your staples in 7-10 days. Please return with any worsened concerns.

## 2017-06-29 NOTE — ED Provider Notes (Signed)
[facility] Emergency Department Provider Note   ____________________________________________   First MD Initiated Contact with Patient 06/28/17 2353     (approximate)  I have reviewed the triage vital signs and the nursing notes.   HISTORY  Chief Complaint Head Laceration and Head Injury    HPI Jacqueline Tate is a 83 y.o. female who comes into the hospital today with a head injury after a fall. The patient reports that she was gardening and when she got up she was trying to step over a flower. She reports that she tripped over a branch and hit her right head on another branch. The patient denies any loss of consciousness. She noticed a lot of bleeding but she does take Coumadin for PE. She had trouble getting up so her daughter came by and helped her up. When the patient stood her vision was a little bit blurry and she felt a little unsteady so decided to come into the hospital. She also needed to have her laceration repaired. The patient does have some head pain but she reports is not severe right now. She reports that her neck feels sore on the right because she jerked her head to the left when she fell. The patient has no chest pain or shortness of breath no other complaints at this time. She is here today for evaluation.   Past Medical History:  Diagnosis Date  . Cancer ([place])    skin  . Hypertension   . Thyroid disease     Patient Active Problem List   Diagnosis Date Noted  . Pulmonary embolism ([place]) 02/28/2017    Past Surgical History:  Procedure Laterality Date  . ABDOMINAL HYSTERECTOMY    . BREAST BIOPSY Bilateral    years ago  . EYE SURGERY    . HERNIA REPAIR    . IR GENERIC HISTORICAL  02/28/2017   IR ANGIOGRAM SELECTIVE EACH ADDITIONAL VESSEL 02/28/2017 Markus Daft, MD [facility]  . IR GENERIC HISTORICAL  02/28/2017   IR INFUSION THROMBOL ARTERIAL INITIAL (MS) 02/28/2017 Markus Daft, MD [facility]  . IR GENERIC HISTORICAL  02/28/2017     IR INFUSION THROMBOL ARTERIAL INITIAL (MS) 02/28/2017 Markus Daft, MD [facility]  . IR GENERIC HISTORICAL  02/28/2017   IR ANGIOGRAM SELECTIVE EACH ADDITIONAL VESSEL 02/28/2017 Markus Daft, MD [facility]  . IR GENERIC HISTORICAL  02/28/2017   IR US GUIDE VASC ACCESS RIGHT 02/28/2017 Markus Daft, MD [facility]  . IR GENERIC HISTORICAL  02/28/2017   IR ANGIOGRAM PULMONARY BILATERAL SELECTIVE 02/28/2017 Markus Daft, MD [facility]  . IR GENERIC HISTORICAL  03/01/2017   IR THROMB F/U EVAL ART/VEN FINAL DAY (MS) 03/01/2017 Markus Daft, MD [facility]  . KNEE SURGERY      Prior to Admission medications   Medication Sig Start Date End Date Taking? Authorizing Provider  ALPRAZolam (XANAX) 0.25 MG tablet Take 0.25-0.5 mg by mouth at bedtime as needed for anxiety.    [provider]  bacitracin ointment Apply to affected area daily 06/29/17 06/29/18  Loney Hering, MD  bisacodyl (DULCOLAX) 5 MG EC tablet Take 5 mg by mouth daily as needed for moderate constipation.    [provider]  cyclobenzaprine (FLEXERIL) 5 MG tablet Take 5 mg by mouth at bedtime.    [provider]  docusate sodium (COLACE) 100 MG capsule Take 100 mg by mouth daily.    [provider]  DULoxetine (CYMBALTA) 60 MG capsule Take 1 capsule by  mouth daily. 05/27/16   [provider]  fluticasone (FLONASE) 50 MCG/ACT nasal spray Place 1 spray into both nostrils daily as needed. 06/17/16   [provider]  gabapentin (NEURONTIN) 600 MG tablet Take 1 tablet by mouth 3 (three) times daily. 05/27/16   [provider]  levothyroxine (SYNTHROID, LEVOTHROID) 50 MCG tablet Take 1 tablet by mouth daily. 05/27/16   [provider]  lisinopril-hydrochlorothiazide (PRINZIDE,ZESTORETIC) 20-12.5 MG tablet Take 1 tablet by mouth daily. 05/13/16   [provider]  loratadine (CLARITIN) 10 MG tablet Take 10 mg by mouth daily.    [provider]  meloxicam (MOBIC) 7.5  MG tablet Take 1 tablet by mouth 2 (two) times daily. 05/27/16   [provider]  metoCLOPramide (REGLAN) 10 MG tablet Take 5 mg by mouth daily. 05/27/16   [provider]  metoprolol (LOPRESSOR) 50 MG tablet Take 1 tablet by mouth 2 (two) times daily. 05/27/16   [provider]  omeprazole (PRILOSEC) 20 MG capsule Take 20 mg by mouth 2 (two) times daily.     [provider]  Probiotic Product ([place]) Take 1 capsule by mouth daily.    [provider]  traZODone (DESYREL) 50 MG tablet Take 50 mg by mouth at bedtime.  04/19/16   [provider]  warfarin (COUMADIN) 2.5 MG tablet Take 1 tablet (2.5 mg total) by mouth daily at 6 PM. 03/07/17   Bonnielee Haff, MD    Allergies Codeine; Latex; Penicillins; and Sulfa antibiotics  Family History  Problem Relation Age of Onset  . Breast cancer Neg Hx     Social History Social History  Substance Use Topics  . Smoking status: Never Smoker  . Smokeless tobacco: Never Used  . Alcohol use Not on file    Review of Systems  Constitutional: No fever/chills Eyes: Blurred vision ENT: No sore throat. Cardiovascular: Denies chest pain. Respiratory: Denies shortness of breath. Gastrointestinal: No abdominal pain.  No nausea, no vomiting.  No diarrhea.  No constipation. Genitourinary: Negative for dysuria. Musculoskeletal: Negative for back pain. Skin: Scalp Laceration Neurological: Dizziness and headache   ____________________________________________   PHYSICAL EXAM:  VITAL SIGNS: ED Triage Vitals  Enc Vitals Group     BP 06/28/17 2008 (!) 187/94     Pulse Rate 06/28/17 2008 71     Resp 06/28/17 2008 18     Temp 06/28/17 2008 98.5 F (36.9 C)     Temp Source 06/28/17 2008 Oral     SpO2 06/28/17 2008 96 %     Weight 06/28/17 2010 146 lb (66.2 kg)     Height 06/28/17 2010 5\' 4"  (1.626 m)     Head Circumference --      Peak Flow --      Pain Score 06/28/17 2008 8      Pain Loc --      Pain Edu? --      Excl. in [place]? --     Constitutional: Alert and oriented. Well appearing and in Mild distress. Eyes: Conjunctivae are normal. PERRL. EOMI. Head: Laceration to right temporal scalp, contusion noted to right forehead Nose: No congestion/rhinnorhea. Mouth/Throat: Mucous membranes are moist.  Oropharynx non-erythematous. Neck: No cervical spine tenderness to palpation. Cardiovascular: Normal rate, regular rhythm. Grossly normal heart sounds.  Good peripheral circulation. Respiratory: Normal respiratory effort.  No retractions. Lungs CTAB. Gastrointestinal: Soft and nontender. No distention. Positive bowel sounds Musculoskeletal: No lower extremity tenderness nor edema.   Neurologic:  Normal speech and language. Cranial nerves II through XII are grossly intact with no focal motor or neuro deficits Skin:  Skin is warm, dry approximately 2-1/2-3 cm laceration to right temporal lobe, no gaping, minimal bleeding at this time, tenderness to palpation and soft tissue swelling surrounding the area. Psychiatric: Mood and affect are normal.   ____________________________________________   LABS (all labs ordered are listed, but only abnormal results are displayed)  Labs Reviewed  PROTIME-INR - Abnormal; Notable for the following:       Result Value   Prothrombin Time 26.2 (*)    All other components within normal limits  CBC - Abnormal; Notable for the following:    RDW 14.7 (*)    All other components within normal limits  BASIC METABOLIC PANEL - Abnormal; Notable for the following:    Sodium 133 (*)    Chloride 98 (*)    Glucose, Bld 103 (*)    All other components within normal limits  TROPONIN I   ____________________________________________  EKG  ED ECG REPORT I, Loney Hering, the attending physician, personally viewed and interpreted this ECG.   Date: 06/29/2017  EKG Time: 0015  Rate: 66  Rhythm: normal sinus rhythm  Axis: normal   Intervals:none  ST&T Change: flipped t waves in lead III, V3  ____________________________________________  RADIOLOGY  Ct Head Wo Contrast  Result Date: 06/28/2017 CLINICAL DATA:  Initial evaluation for acute trauma, fall. EXAM: CT HEAD WITHOUT CONTRAST CT CERVICAL SPINE WITHOUT CONTRAST TECHNIQUE: Multidetector CT imaging of the head and cervical spine was performed following the standard protocol without intravenous contrast. Multiplanar CT image reconstructions of the cervical spine were also generated. COMPARISON:  Prior MRI from 10/04/2012. FINDINGS: CT HEAD FINDINGS Brain: Age-related cerebral volume loss. Encephalomalacia of within the right occipital lobe most consistent with remote infarct. This is largely chronic in appearance, although a superimposed subacute component is not entirely excluded. No acute intracranial hemorrhage. No evidence for additional acute large vessel territory infarct. No mass lesion, midline shift or mass effect. No hydrocephalus. No extra-axial fluid collection. Vascular: No hyperdense vessel. Scattered vascular calcifications noted within the carotid siphons. Skull: Soft tissue contusion with laceration present at the right frontal scalp. Calvarium intact. Sinuses/Orbits: Globes and orbital soft tissues within normal limits. Scattered mucosal thickening within the visualized ethmoidal air cells. Visualized paranasal sinuses are otherwise clear. No mastoid effusion. CT CERVICAL SPINE FINDINGS Alignment: Straightening of the normal cervical lordosis. No listhesis. Skull base and vertebrae: Skullbase intact. Mild rotation of C1 on C2 likely positional. Dens intact. Vertebral body heights are maintained. No acute fracture. Soft tissues and spinal canal: Soft tissues of the neck demonstrate no acute abnormality. No prevertebral edema. Vascular calcifications about the carotid bifurcations. Disc levels: Moderate degenerative spondylolysis present at C3-4 through C6-7. Upper  chest: Visualized upper chest within normal limits. Partially visualized lungs are clear. No apical pneumothorax. IMPRESSION: CT BRAIN: 1. No definite acute intracranial process. 2. Soft tissue contusion/laceration at the right frontal scalp. 3. Encephalomalacia within the right occipital lobe, largely chronic in appearance, and consistent with a chronic/remote infarct. Possible superimposed subacute component not entirely excluded. Further evaluation with dedicated brain MRI could be performed if there is clinical concern for ischemia. CT CERVICAL SPINE: 1. No acute traumatic injury within cervical spine. 2. Moderate degenerative spondylolysis at C3-4 through C6-7. Electronically Signed   By: Jeannine Boga M.D.   On: 06/28/2017 21:48   Ct Cervical Spine Wo Contrast  Result Date: 06/28/2017 CLINICAL  DATA:  Initial evaluation for acute trauma, fall. EXAM: CT HEAD WITHOUT CONTRAST CT CERVICAL SPINE WITHOUT CONTRAST TECHNIQUE: Multidetector CT imaging of the head and cervical spine was performed following the standard protocol without intravenous contrast. Multiplanar CT image reconstructions of the cervical spine were also generated. COMPARISON:  Prior MRI from 10/04/2012. FINDINGS: CT HEAD FINDINGS Brain: Age-related cerebral volume loss. Encephalomalacia of within the right occipital lobe most consistent with remote infarct. This is largely chronic in appearance, although a superimposed subacute component is not entirely excluded. No acute intracranial hemorrhage. No evidence for additional acute large vessel territory infarct. No mass lesion, midline shift or mass effect. No hydrocephalus. No extra-axial fluid collection. Vascular: No hyperdense vessel. Scattered vascular calcifications noted within the carotid siphons. Skull: Soft tissue contusion with laceration present at the right frontal scalp. Calvarium intact. Sinuses/Orbits: Globes and orbital soft tissues within normal limits. Scattered mucosal  thickening within the visualized ethmoidal air cells. Visualized paranasal sinuses are otherwise clear. No mastoid effusion. CT CERVICAL SPINE FINDINGS Alignment: Straightening of the normal cervical lordosis. No listhesis. Skull base and vertebrae: Skullbase intact. Mild rotation of C1 on C2 likely positional. Dens intact. Vertebral body heights are maintained. No acute fracture. Soft tissues and spinal canal: Soft tissues of the neck demonstrate no acute abnormality. No prevertebral edema. Vascular calcifications about the carotid bifurcations. Disc levels: Moderate degenerative spondylolysis present at C3-4 through C6-7. Upper chest: Visualized upper chest within normal limits. Partially visualized lungs are clear. No apical pneumothorax. IMPRESSION: CT BRAIN: 1. No definite acute intracranial process. 2. Soft tissue contusion/laceration at the right frontal scalp. 3. Encephalomalacia within the right occipital lobe, largely chronic in appearance, and consistent with a chronic/remote infarct. Possible superimposed subacute component not entirely excluded. Further evaluation with dedicated brain MRI could be performed if there is clinical concern for ischemia. CT CERVICAL SPINE: 1. No acute traumatic injury within cervical spine. 2. Moderate degenerative spondylolysis at C3-4 through C6-7. Electronically Signed   By: Jeannine Boga M.D.   On: 06/28/2017 21:48    ____________________________________________   PROCEDURES  Procedure(s) performed: please, see procedure note(s).  Marland Kitchen.Laceration Repair Date/Time: 06/29/2017 1:20 AM Performed by: Loney Hering Authorized by: Loney Hering   Consent:    Consent obtained:  Verbal Anesthesia (see MAR for exact dosages):    Anesthesia method:  Topical application   Topical anesthetic:  LET Laceration details:    Location:  Scalp   Scalp location:  R temporal   Length (cm):  3 Repair type:    Repair type:  Simple Pre-procedure details:      Preparation:  Patient was prepped and draped in usual sterile fashion Exploration:    Contaminated: no   Treatment:    Amount of cleaning:  Standard Skin repair:    Repair method:  Staples   Number of staples:  3 Post-procedure details:    Dressing:  Antibiotic ointment   Patient tolerance of procedure:  Tolerated well, no immediate complications    Critical Care performed: No  ____________________________________________   INITIAL IMPRESSION / ASSESSMENT AND PLAN / ED COURSE  Pertinent labs & imaging results that were available during my care of the patient were reviewed by me and considered in my medical decision making (see chart for details).  This is an 83 year old female who comes into the hospital today after a fall. The patient did feel a little bit dizzy after her falls we did check some blood work. We checked her INR which was within  appropriate limits at 2.36. We checked a CBC which showed a hemoglobin and hematocrit 14.2 and 41.1 and the remaining blood work was unremarkable. The patient's CT scan was also unremarkable. She had encephalomalacia within the right occipital lobe that appeared remote or chronic. The patient had no weakness and no other complaints. Her cranial nerves are fully intact. She also has no other traumatic injury in her cervical spine. Her blood pressure was elevated but we did give her her dose of lisinopril and her blood pressure improved. I also gave her some Tylenol for pain. The patient received 3 staples and should have those removed in approximately 7-10 days. The patient be discharged home to follow-up.      ____________________________________________   FINAL CLINICAL IMPRESSION(S) / ED DIAGNOSES  Final diagnoses:  Injury of head, initial encounter  Fall, initial encounter  Laceration of scalp, initial encounter      NEW MEDICATIONS STARTED DURING THIS VISIT:  Discharge Medication List as of 06/29/2017  3:05 AM       Note:   This document was prepared using Dragon voice recognition software and may include unintentional dictation errors.    Loney Hering, MD 06/29/17 [id number]

## 2017-08-17 ENCOUNTER — Other Ambulatory Visit: Payer: Self-pay | Admitting: Family Medicine

## 2017-08-17 DIAGNOSIS — Z1231 Encounter for screening mammogram for malignant neoplasm of breast: Secondary | ICD-10-CM

## 2017-10-20 ENCOUNTER — Institutional Professional Consult (permissible substitution): Payer: PRIVATE HEALTH INSURANCE | Admitting: Radiation Oncology

## 2017-10-24 ENCOUNTER — Encounter (INDEPENDENT_AMBULATORY_CARE_PROVIDER_SITE_OTHER): Payer: Self-pay

## 2017-10-24 ENCOUNTER — Encounter: Payer: Self-pay | Admitting: Radiation Oncology

## 2017-10-24 ENCOUNTER — Ambulatory Visit
Admission: RE | Admit: 2017-10-24 | Discharge: 2017-10-24 | Disposition: A | Discharge status: Home / Self Care | Payer: Medicare Other | Source: Ambulatory Visit | Attending: Radiation Oncology | Admitting: Radiation Oncology

## 2017-10-24 ENCOUNTER — Other Ambulatory Visit: Payer: Self-pay

## 2017-10-24 VITALS — BP 175/86 | HR 66 | Temp 96.9°F | Resp 18 | Wt 158.7 lb

## 2017-10-24 DIAGNOSIS — C44311 Basal cell carcinoma of skin of nose: Secondary | ICD-10-CM | POA: Insufficient documentation

## 2017-10-24 DIAGNOSIS — I10 Essential (primary) hypertension: Secondary | ICD-10-CM | POA: Diagnosis not present

## 2017-10-24 DIAGNOSIS — Z510 Encounter for antineoplastic radiation therapy: Secondary | ICD-10-CM | POA: Diagnosis present

## 2017-10-24 DIAGNOSIS — E079 Disorder of thyroid, unspecified: Secondary | ICD-10-CM | POA: Diagnosis not present

## 2017-10-24 DIAGNOSIS — Z85828 Personal history of other malignant neoplasm of skin: Secondary | ICD-10-CM | POA: Diagnosis not present

## 2017-10-24 DIAGNOSIS — Z79899 Other long term (current) drug therapy: Secondary | ICD-10-CM | POA: Diagnosis not present

## 2017-10-24 DIAGNOSIS — C4491 Basal cell carcinoma of skin, unspecified: Secondary | ICD-10-CM

## 2017-10-24 NOTE — Consult Note (Signed)
NEW PATIENT EVALUATION  Name: Jacqueline Tate  MRN: [id number]  Date:   10/24/2017     DOB: 02-Jun-1931   This 83 y.o. 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  REFERRING PHYSICIAN: Derinda Late, MD  CHIEF COMPLAINT:  Chief Complaint  Patient presents with  . Cancer    Pt is here for initial consultation of skin cancer.      DIAGNOSIS: There were no encounter diagnoses.   PREVIOUS INVESTIGATIONS:  Pathology reports reviewed Clinical notes reviewed  HPI: Patient is a 83 year old female whose husband was a former patient of mine who I successfully treated with electron beam for skin cancer. She's had a growing lesion on the left nasal bridge over the past year. Recent biopsy of this area was positive for basal cell carcinoma superficial and nodular pattern extending to the edge and base. She is seen today self-referred for consideration of treatment. She is asymptomatic.  PLANNED TREATMENT REGIMEN: Electron beam therapy  PAST MEDICAL HISTORY:  has a past medical history of Cancer ([place]), Hypertension, and Thyroid disease.    PAST SURGICAL HISTORY:  Past Surgical History:  Procedure Laterality Date  . ABDOMINAL HYSTERECTOMY    . BREAST BIOPSY Bilateral    years ago  . EYE SURGERY    . HERNIA REPAIR    . IR GENERIC HISTORICAL  02/28/2017   IR ANGIOGRAM SELECTIVE EACH ADDITIONAL VESSEL 02/28/2017 Markus Daft, MD [facility]  . IR GENERIC HISTORICAL  02/28/2017   IR INFUSION THROMBOL ARTERIAL INITIAL (MS) 02/28/2017 Markus Daft, MD [facility]  . IR GENERIC HISTORICAL  02/28/2017   IR INFUSION THROMBOL ARTERIAL INITIAL (MS) 02/28/2017 Markus Daft, MD [facility]  . IR GENERIC HISTORICAL  02/28/2017   IR ANGIOGRAM SELECTIVE EACH ADDITIONAL VESSEL 02/28/2017 Markus Daft, MD [facility]  . IR GENERIC HISTORICAL  02/28/2017   IR US GUIDE VASC ACCESS RIGHT 02/28/2017 Markus Daft, MD [facility]  . IR GENERIC HISTORICAL   02/28/2017   IR ANGIOGRAM PULMONARY BILATERAL SELECTIVE 02/28/2017 Markus Daft, MD [facility]  . IR GENERIC HISTORICAL  03/01/2017   IR THROMB F/U EVAL ART/VEN FINAL DAY (MS) 03/01/2017 Markus Daft, MD [facility]  . KNEE SURGERY      FAMILY HISTORY: family history is not on file.  SOCIAL HISTORY:  reports that  has never smoked. she has never used smokeless tobacco.  ALLERGIES: Codeine; Latex; Simvastatin; Penicillins; and Sulfa antibiotics  MEDICATIONS:  Current Outpatient Medications  Medication Sig Dispense Refill  . amLODipine (NORVASC) 5 MG tablet TAKE 1 TABLET BY MOUTH TWO  TIMES DAILY    . bisacodyl (DULCOLAX) 5 MG EC tablet Take 5 mg by mouth daily as needed for moderate constipation.    . docusate sodium (COLACE) 100 MG capsule Take 100 mg by mouth daily.    . DULoxetine (CYMBALTA) 60 MG capsule Take 1 capsule by mouth daily.    . fluticasone (FLONASE) 50 MCG/ACT nasal spray Place 1 spray into both nostrils daily as needed.    . gabapentin (NEURONTIN) 600 MG tablet Take 1 tablet by mouth 3 (three) times daily.    Marland Kitchen levothyroxine (SYNTHROID, LEVOTHROID) 50 MCG tablet Take 1 tablet by mouth daily.    Marland Kitchen lisinopril (PRINIVIL,ZESTRIL) 20 MG tablet Take by mouth.    . loratadine (CLARITIN) 10 MG tablet Take 10 mg by mouth daily.    . metoCLOPramide (REGLAN) 10 MG tablet Take 5 mg by mouth  daily.    . metoprolol (LOPRESSOR) 50 MG tablet Take 1 tablet by mouth 2 (two) times daily.    . mirtazapine (REMERON) 15 MG tablet Take by mouth.    Marland Kitchen omeprazole (PRILOSEC) 20 MG capsule Take 20 mg by mouth 2 (two) times daily.     . Probiotic Product ([place] HEALTH PO) Take 1 capsule by mouth daily.    Marland Kitchen warfarin (COUMADIN) 2.5 MG tablet Take 1 tablet (2.5 mg total) by mouth daily at 6 PM. 30 tablet 0  . ALPRAZolam (XANAX) 0.25 MG tablet Take 0.25-0.5 mg by mouth at bedtime as needed for anxiety.    . bacitracin ointment Apply to affected area daily (Patient not taking: Reported on  10/24/2017) 30 g 0  . cyclobenzaprine (FLEXERIL) 5 MG tablet Take 5 mg by mouth at bedtime.    . diclofenac sodium (VOLTAREN) 1 % GEL Apply topically.    . meloxicam (MOBIC) 7.5 MG tablet Take 1 tablet by mouth 2 (two) times daily.    . traZODone (DESYREL) 50 MG tablet Take 50 mg by mouth at bedtime.      No current facility-administered medications for this encounter.     ECOG PERFORMANCE STATUS:  0 - Asymptomatic  REVIEW OF SYSTEMS:  Patient denies any weight loss, fatigue, weakness, fever, chills or night sweats. Patient denies any loss of vision, blurred vision. Patient denies any ringing  of the ears or hearing loss. No irregular heartbeat. Patient denies heart murmur or history of fainting. Patient denies any chest pain or pain radiating to her upper extremities. Patient denies any shortness of breath, difficulty breathing at night, cough or hemoptysis. Patient denies any swelling in the lower legs. Patient denies any nausea vomiting, vomiting of blood, or coffee ground material in the vomitus. Patient denies any stomach pain. Patient states has had normal bowel movements no significant constipation or diarrhea. Patient denies any dysuria, hematuria or significant nocturia. Patient denies any problems walking, swelling in the joints or loss of balance. Patient denies any skin changes, loss of hair or loss of weight. Patient denies any excessive worrying or anxiety or significant depression. Patient denies any problems with insomnia. Patient denies excessive thirst, polyuria, polydipsia. Patient denies any swollen glands, patient denies easy bruising or easy bleeding. Patient denies any recent infections, allergies or URI. Patient "s visual fields have not changed significantly in recent time.    PHYSICAL EXAM: BP (!) 175/86   Pulse 66   Temp (!) 96.9 F (36.1 C)   Resp 18   Wt 158 lb 11.7 oz (72 kg)   BMI 27.25 kg/m  Slightly raised erythematous lesion the left nasal bridge extending  close to would not abutting into the orbital region. No evidence of preauricular sub-digastric or cervical adenopathy is identified. Well-developed well-nourished patient in NAD. HEENT reveals PERLA, EOMI, discs not visualized.  Oral cavity is clear. No oral mucosal lesions are identified. Neck is clear without evidence of cervical or supraclavicular adenopathy. Lungs are clear to A&P. Cardiac examination is essentially unremarkable with regular rate and rhythm without murmur rub or thrill. Abdomen is benign with no organomegaly or masses noted. Motor sensory and DTR levels are equal and symmetric in the upper and lower extremities. Cranial nerves II through XII are grossly intact. Proprioception is intact. No peripheral adenopathy or edema is identified. No motor or sensory levels are noted. Crude visual fields are within normal range.  LABORATORY DATA: Pathology report reviewed    RADIOLOGY RESULTS: Recent CT  scan performed July of her head with contrast is reviewed   IMPRESSION: Superficial basal cell carcinoma the left nasal bridge in 83 year old female  PLAN: At this time I've offered electron beam therapy to this lesion. I would plan on delivering 5000 cGy over 5 weeks. Would use a lead shield to spare the orbit any indirect radiation. Risks and benefits of treatment including skin reaction slight fatigue all were discussed in detail with the patient and her husband. They both seem to comprehend my treatment plan well. I've personally set up and ordered CT simulation for early next week.  I would like to take this opportunity to thank you for allowing me to participate in the care of your patient.Armstead Peaks., MD

## 2017-10-31 ENCOUNTER — Ambulatory Visit
Admission: RE | Admit: 2017-10-31 | Discharge: 2017-10-31 | Disposition: A | Discharge status: Home / Self Care | Payer: Medicare Other | Source: Ambulatory Visit | Attending: Radiation Oncology | Admitting: Radiation Oncology

## 2017-10-31 DIAGNOSIS — Z510 Encounter for antineoplastic radiation therapy: Secondary | ICD-10-CM | POA: Diagnosis not present

## 2017-11-06 DIAGNOSIS — Z510 Encounter for antineoplastic radiation therapy: Secondary | ICD-10-CM | POA: Diagnosis not present

## 2017-11-08 DIAGNOSIS — Z510 Encounter for antineoplastic radiation therapy: Secondary | ICD-10-CM | POA: Diagnosis not present

## 2017-11-09 ENCOUNTER — Ambulatory Visit
Admission: RE | Admit: 2017-11-09 | Discharge: 2017-11-09 | Disposition: A | Discharge status: Home / Self Care | Payer: Medicare Other | Source: Ambulatory Visit | Attending: Radiation Oncology | Admitting: Radiation Oncology

## 2017-11-09 DIAGNOSIS — Z510 Encounter for antineoplastic radiation therapy: Secondary | ICD-10-CM | POA: Diagnosis not present

## 2017-11-10 ENCOUNTER — Ambulatory Visit
Admission: RE | Admit: 2017-11-10 | Discharge: 2017-11-10 | Disposition: A | Discharge status: Home / Self Care | Payer: Medicare Other | Source: Ambulatory Visit | Attending: Radiation Oncology | Admitting: Radiation Oncology

## 2017-11-10 DIAGNOSIS — Z510 Encounter for antineoplastic radiation therapy: Secondary | ICD-10-CM | POA: Diagnosis not present

## 2017-11-13 ENCOUNTER — Ambulatory Visit
Admission: RE | Admit: 2017-11-13 | Discharge: 2017-11-13 | Disposition: A | Discharge status: Home / Self Care | Payer: Medicare Other | Source: Ambulatory Visit | Attending: Radiation Oncology | Admitting: Radiation Oncology

## 2017-11-13 DIAGNOSIS — Z510 Encounter for antineoplastic radiation therapy: Secondary | ICD-10-CM | POA: Diagnosis not present

## 2017-11-14 ENCOUNTER — Ambulatory Visit
Admission: RE | Admit: 2017-11-14 | Discharge: 2017-11-14 | Disposition: A | Discharge status: Home / Self Care | Payer: Medicare Other | Source: Ambulatory Visit | Attending: Radiation Oncology | Admitting: Radiation Oncology

## 2017-11-14 DIAGNOSIS — Z510 Encounter for antineoplastic radiation therapy: Secondary | ICD-10-CM | POA: Diagnosis not present

## 2017-11-15 ENCOUNTER — Ambulatory Visit
Admission: RE | Admit: 2017-11-15 | Discharge: 2017-11-15 | Disposition: A | Discharge status: Home / Self Care | Payer: Medicare Other | Source: Ambulatory Visit | Attending: Radiation Oncology | Admitting: Radiation Oncology

## 2017-11-15 ENCOUNTER — Encounter: Payer: Self-pay | Admitting: *Deleted

## 2017-11-15 DIAGNOSIS — Z510 Encounter for antineoplastic radiation therapy: Secondary | ICD-10-CM | POA: Diagnosis not present

## 2017-11-16 ENCOUNTER — Ambulatory Visit
Admission: RE | Admit: 2017-11-16 | Discharge: 2017-11-16 | Disposition: A | Discharge status: Home / Self Care | Payer: Medicare Other | Source: Ambulatory Visit | Attending: Radiation Oncology | Admitting: Radiation Oncology

## 2017-11-16 DIAGNOSIS — Z510 Encounter for antineoplastic radiation therapy: Secondary | ICD-10-CM | POA: Diagnosis not present

## 2017-11-17 ENCOUNTER — Ambulatory Visit
Admission: RE | Admit: 2017-11-17 | Discharge: 2017-11-17 | Disposition: A | Discharge status: Home / Self Care | Payer: Medicare Other | Source: Ambulatory Visit | Attending: Radiation Oncology | Admitting: Radiation Oncology

## 2017-11-17 DIAGNOSIS — Z510 Encounter for antineoplastic radiation therapy: Secondary | ICD-10-CM | POA: Diagnosis not present

## 2017-11-20 ENCOUNTER — Ambulatory Visit: Payer: Medicare Other

## 2017-11-21 ENCOUNTER — Ambulatory Visit: Payer: Medicare Other

## 2017-11-22 ENCOUNTER — Ambulatory Visit: Payer: Medicare Other

## 2017-11-23 ENCOUNTER — Ambulatory Visit
Admission: RE | Admit: 2017-11-23 | Discharge: 2017-11-23 | Disposition: A | Discharge status: Home / Self Care | Payer: Medicare Other | Source: Ambulatory Visit | Attending: Radiation Oncology | Admitting: Radiation Oncology

## 2017-11-23 DIAGNOSIS — Z510 Encounter for antineoplastic radiation therapy: Secondary | ICD-10-CM | POA: Diagnosis not present

## 2017-11-24 ENCOUNTER — Ambulatory Visit
Admission: RE | Admit: 2017-11-24 | Discharge: 2017-11-24 | Disposition: A | Discharge status: Home / Self Care | Payer: Medicare Other | Source: Ambulatory Visit | Attending: Radiation Oncology | Admitting: Radiation Oncology

## 2017-11-24 DIAGNOSIS — Z510 Encounter for antineoplastic radiation therapy: Secondary | ICD-10-CM | POA: Diagnosis not present

## 2017-11-27 ENCOUNTER — Ambulatory Visit
Admission: RE | Admit: 2017-11-27 | Discharge: 2017-11-27 | Disposition: A | Discharge status: Home / Self Care | Payer: Medicare Other | Source: Ambulatory Visit | Attending: Radiation Oncology | Admitting: Radiation Oncology

## 2017-11-27 DIAGNOSIS — Z510 Encounter for antineoplastic radiation therapy: Secondary | ICD-10-CM | POA: Diagnosis not present

## 2017-11-28 ENCOUNTER — Ambulatory Visit
Admission: RE | Admit: 2017-11-28 | Discharge: 2017-11-28 | Disposition: A | Discharge status: Home / Self Care | Payer: Medicare Other | Source: Ambulatory Visit | Attending: Radiation Oncology | Admitting: Radiation Oncology

## 2017-11-28 DIAGNOSIS — Z510 Encounter for antineoplastic radiation therapy: Secondary | ICD-10-CM | POA: Diagnosis not present

## 2017-11-29 ENCOUNTER — Ambulatory Visit
Admission: RE | Admit: 2017-11-29 | Discharge: 2017-11-29 | Disposition: A | Discharge status: Home / Self Care | Payer: Medicare Other | Source: Ambulatory Visit | Attending: Radiation Oncology | Admitting: Radiation Oncology

## 2017-11-29 DIAGNOSIS — Z510 Encounter for antineoplastic radiation therapy: Secondary | ICD-10-CM | POA: Diagnosis not present

## 2017-11-30 ENCOUNTER — Ambulatory Visit
Admission: RE | Admit: 2017-11-30 | Discharge: 2017-11-30 | Disposition: A | Discharge status: Home / Self Care | Payer: Medicare Other | Source: Ambulatory Visit | Attending: Radiation Oncology | Admitting: Radiation Oncology

## 2017-11-30 DIAGNOSIS — Z510 Encounter for antineoplastic radiation therapy: Secondary | ICD-10-CM | POA: Diagnosis not present

## 2017-12-01 ENCOUNTER — Ambulatory Visit
Admission: RE | Admit: 2017-12-01 | Discharge: 2017-12-01 | Disposition: A | Discharge status: Home / Self Care | Payer: Medicare Other | Source: Ambulatory Visit | Attending: Radiation Oncology | Admitting: Radiation Oncology

## 2017-12-01 DIAGNOSIS — Z510 Encounter for antineoplastic radiation therapy: Secondary | ICD-10-CM | POA: Diagnosis not present

## 2017-12-04 ENCOUNTER — Ambulatory Visit: Payer: Medicare Other

## 2017-12-06 ENCOUNTER — Ambulatory Visit
Admission: RE | Admit: 2017-12-06 | Discharge: 2017-12-06 | Disposition: A | Discharge status: Home / Self Care | Payer: Medicare Other | Source: Ambulatory Visit | Attending: Radiation Oncology | Admitting: Radiation Oncology

## 2017-12-06 DIAGNOSIS — Z510 Encounter for antineoplastic radiation therapy: Secondary | ICD-10-CM | POA: Diagnosis not present

## 2017-12-07 ENCOUNTER — Ambulatory Visit
Admission: RE | Admit: 2017-12-07 | Discharge: 2017-12-07 | Disposition: A | Discharge status: Home / Self Care | Payer: Medicare Other | Source: Ambulatory Visit | Attending: Radiation Oncology | Admitting: Radiation Oncology

## 2017-12-07 DIAGNOSIS — Z510 Encounter for antineoplastic radiation therapy: Secondary | ICD-10-CM | POA: Diagnosis not present

## 2017-12-08 ENCOUNTER — Ambulatory Visit
Admission: RE | Admit: 2017-12-08 | Discharge: 2017-12-08 | Disposition: A | Discharge status: Home / Self Care | Payer: Medicare Other | Source: Ambulatory Visit | Attending: Radiation Oncology | Admitting: Radiation Oncology

## 2017-12-08 DIAGNOSIS — Z510 Encounter for antineoplastic radiation therapy: Secondary | ICD-10-CM | POA: Diagnosis not present

## 2017-12-11 ENCOUNTER — Ambulatory Visit
Admission: RE | Admit: 2017-12-11 | Discharge: 2017-12-11 | Disposition: A | Discharge status: Home / Self Care | Payer: Medicare Other | Source: Ambulatory Visit | Attending: Radiation Oncology | Admitting: Radiation Oncology

## 2017-12-11 DIAGNOSIS — Z510 Encounter for antineoplastic radiation therapy: Secondary | ICD-10-CM | POA: Diagnosis not present

## 2017-12-12 DIAGNOSIS — E079 Disorder of thyroid, unspecified: Secondary | ICD-10-CM | POA: Diagnosis not present

## 2017-12-12 DIAGNOSIS — Z510 Encounter for antineoplastic radiation therapy: Secondary | ICD-10-CM | POA: Diagnosis present

## 2017-12-12 DIAGNOSIS — Z79899 Other long term (current) drug therapy: Secondary | ICD-10-CM | POA: Diagnosis not present

## 2017-12-12 DIAGNOSIS — C44311 Basal cell carcinoma of skin of nose: Secondary | ICD-10-CM | POA: Diagnosis not present

## 2017-12-12 DIAGNOSIS — I10 Essential (primary) hypertension: Secondary | ICD-10-CM | POA: Diagnosis not present

## 2017-12-12 DIAGNOSIS — Z85828 Personal history of other malignant neoplasm of skin: Secondary | ICD-10-CM | POA: Diagnosis not present

## 2017-12-13 ENCOUNTER — Ambulatory Visit
Admission: RE | Admit: 2017-12-13 | Discharge: 2017-12-13 | Disposition: A | Discharge status: Home / Self Care | Payer: Medicare Other | Source: Ambulatory Visit | Attending: Radiation Oncology | Admitting: Radiation Oncology

## 2017-12-13 DIAGNOSIS — Z510 Encounter for antineoplastic radiation therapy: Secondary | ICD-10-CM | POA: Diagnosis not present

## 2017-12-14 ENCOUNTER — Ambulatory Visit
Admission: RE | Admit: 2017-12-14 | Discharge: 2017-12-14 | Disposition: A | Discharge status: Home / Self Care | Payer: Medicare Other | Source: Ambulatory Visit | Attending: Radiation Oncology | Admitting: Radiation Oncology

## 2017-12-14 DIAGNOSIS — Z510 Encounter for antineoplastic radiation therapy: Secondary | ICD-10-CM | POA: Diagnosis not present

## 2017-12-15 ENCOUNTER — Ambulatory Visit
Admission: RE | Admit: 2017-12-15 | Discharge: 2017-12-15 | Disposition: A | Discharge status: Home / Self Care | Payer: Medicare Other | Source: Ambulatory Visit | Attending: Radiation Oncology | Admitting: Radiation Oncology

## 2017-12-15 DIAGNOSIS — Z510 Encounter for antineoplastic radiation therapy: Secondary | ICD-10-CM | POA: Diagnosis not present

## 2017-12-18 ENCOUNTER — Ambulatory Visit: Payer: Medicare Other

## 2017-12-18 ENCOUNTER — Ambulatory Visit
Admission: RE | Admit: 2017-12-18 | Discharge: 2017-12-18 | Disposition: A | Discharge status: Home / Self Care | Payer: Medicare Other | Source: Ambulatory Visit | Attending: Radiation Oncology | Admitting: Radiation Oncology

## 2017-12-18 DIAGNOSIS — Z510 Encounter for antineoplastic radiation therapy: Secondary | ICD-10-CM | POA: Diagnosis not present

## 2017-12-19 ENCOUNTER — Ambulatory Visit
Admission: RE | Admit: 2017-12-19 | Discharge: 2017-12-19 | Disposition: A | Discharge status: Home / Self Care | Payer: Medicare Other | Source: Ambulatory Visit | Attending: Radiation Oncology | Admitting: Radiation Oncology

## 2017-12-19 ENCOUNTER — Ambulatory Visit: Payer: Medicare Other

## 2017-12-19 DIAGNOSIS — Z510 Encounter for antineoplastic radiation therapy: Secondary | ICD-10-CM | POA: Diagnosis not present

## 2017-12-20 ENCOUNTER — Ambulatory Visit: Payer: Medicare Other

## 2017-12-20 ENCOUNTER — Ambulatory Visit
Admission: RE | Admit: 2017-12-20 | Discharge: 2017-12-20 | Disposition: A | Discharge status: Home / Self Care | Payer: Medicare Other | Source: Ambulatory Visit | Attending: Radiation Oncology | Admitting: Radiation Oncology

## 2017-12-20 DIAGNOSIS — Z510 Encounter for antineoplastic radiation therapy: Secondary | ICD-10-CM | POA: Diagnosis not present

## 2017-12-21 ENCOUNTER — Ambulatory Visit
Admission: RE | Admit: 2017-12-21 | Discharge: 2017-12-21 | Disposition: A | Discharge status: Home / Self Care | Payer: Medicare Other | Source: Ambulatory Visit | Attending: Radiation Oncology | Admitting: Radiation Oncology

## 2017-12-21 ENCOUNTER — Ambulatory Visit
Admission: RE | Admit: 2017-12-21 | Discharge: 2017-12-21 | Disposition: A | Discharge status: Home / Self Care | Payer: Medicare Other | Source: Ambulatory Visit | Attending: Family Medicine | Admitting: Family Medicine

## 2017-12-21 DIAGNOSIS — Z510 Encounter for antineoplastic radiation therapy: Secondary | ICD-10-CM | POA: Diagnosis not present

## 2017-12-21 DIAGNOSIS — Z1231 Encounter for screening mammogram for malignant neoplasm of breast: Secondary | ICD-10-CM | POA: Diagnosis not present

## 2017-12-21 HISTORY — DX: Personal history of irradiation: Z92.3

## 2018-01-25 ENCOUNTER — Other Ambulatory Visit: Payer: Self-pay

## 2018-01-25 ENCOUNTER — Encounter: Payer: Self-pay | Admitting: Radiation Oncology

## 2018-01-25 ENCOUNTER — Ambulatory Visit
Admission: RE | Admit: 2018-01-25 | Discharge: 2018-01-25 | Disposition: A | Discharge status: Home / Self Care | Payer: Medicare Other | Source: Ambulatory Visit | Attending: Radiation Oncology | Admitting: Radiation Oncology

## 2018-01-25 VITALS — BP 132/77 | HR 73 | Temp 96.5°F | Resp 20 | Wt 157.7 lb

## 2018-01-25 DIAGNOSIS — Z923 Personal history of irradiation: Secondary | ICD-10-CM | POA: Insufficient documentation

## 2018-01-25 DIAGNOSIS — C44311 Basal cell carcinoma of skin of nose: Secondary | ICD-10-CM | POA: Diagnosis present

## 2018-01-25 DIAGNOSIS — C4491 Basal cell carcinoma of skin, unspecified: Secondary | ICD-10-CM

## 2018-01-25 NOTE — Progress Notes (Signed)
Radiation Oncology Follow up Note  Name: Jacqueline Tate   Date:   01/25/2018 MRN:  [id number] DOB: 1931/08/13    This 84 y.o. female presents to the clinic today for one-month follow-up status post electron beam to her left nose for basal cell carcinoma of the left nasal bridge.  REFERRING PROVIDER: Derinda Late, MD  HPI: Patient is an 84 year old female now seen out 1 month having completed electron beam therapy to her left lateral nose bridge for basal cell carcinoma. Seen today in routine follow-up she is doing well. She specifically denies any problems with her vision or eyes. Masses completely resolved..  COMPLICATIONS OF TREATMENT: none  FOLLOW UP COMPLIANCE: keeps appointments   PHYSICAL EXAM:  BP 132/77   Pulse 73   Temp (!) 96.5 F (35.8 C)   Resp 20   Wt 157 lb 11.8 oz (71.6 kg)   BMI 27.08 kg/m  Area of the previous skin lesion shows no evidence of disease some mild nodularity still present. No evidence of subject gastric cervical or supraclavicular adenopathy is identified. Well-developed well-nourished patient in NAD. HEENT reveals PERLA, EOMI, discs not visualized.  Oral cavity is clear. No oral mucosal lesions are identified. Neck is clear without evidence of cervical or supraclavicular adenopathy. Lungs are clear to A&P. Cardiac examination is essentially unremarkable with regular rate and rhythm without murmur rub or thrill. Abdomen is benign with no organomegaly or masses noted. Motor sensory and DTR levels are equal and symmetric in the upper and lower extremities. Cranial nerves II through XII are grossly intact. Proprioception is intact. No peripheral adenopathy or edema is identified. No motor or sensory levels are noted. Crude visual fields are within normal range.  RADIOLOGY RESULTS: No current films for review  PLAN: At the present time patient is doing well she's had an excellent response to electron beam therapy. I've asked to see her back in 4-5 months  for follow-up. She continues close follow-up care with dermatology. She knows to call sooner with any concerns.  I would like to take this opportunity to thank you for allowing me to participate in the care of your patient.Noreene Filbert, MD

## 2018-05-21 IMAGING — MG MM DIGITAL SCREENING BILAT W/ TOMO W/ CAD
9 of 12 series · 9 of 28 positions shown · non-contrast
Comparison: Previous exam(s).

CLINICAL DATA: Screening.

EXAM:
2D DIGITAL SCREENING BILATERAL MAMMOGRAM WITH 3D TOMO WITH CAD

[L MLO synth-2D]
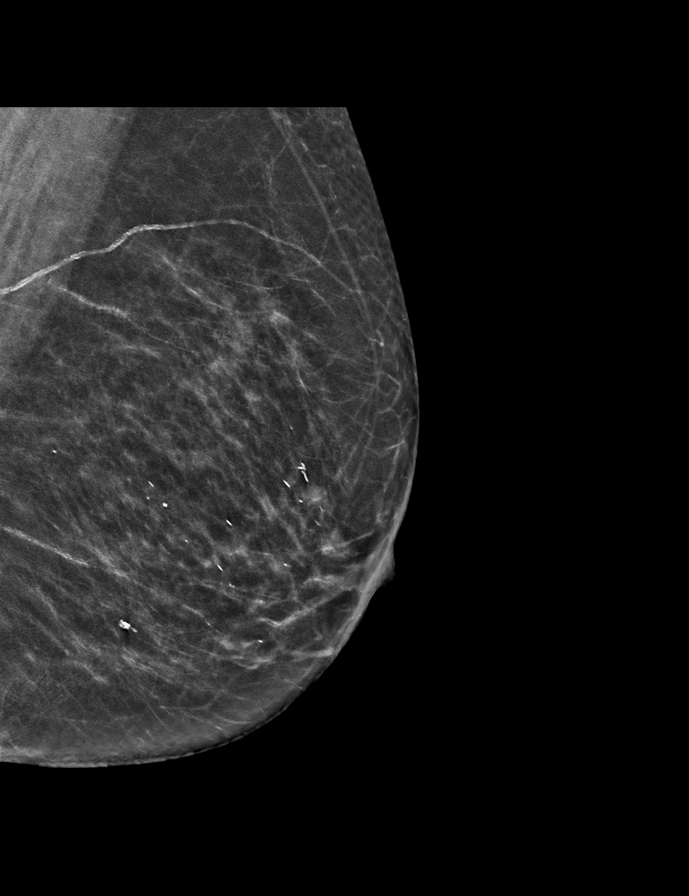

[R MLO]
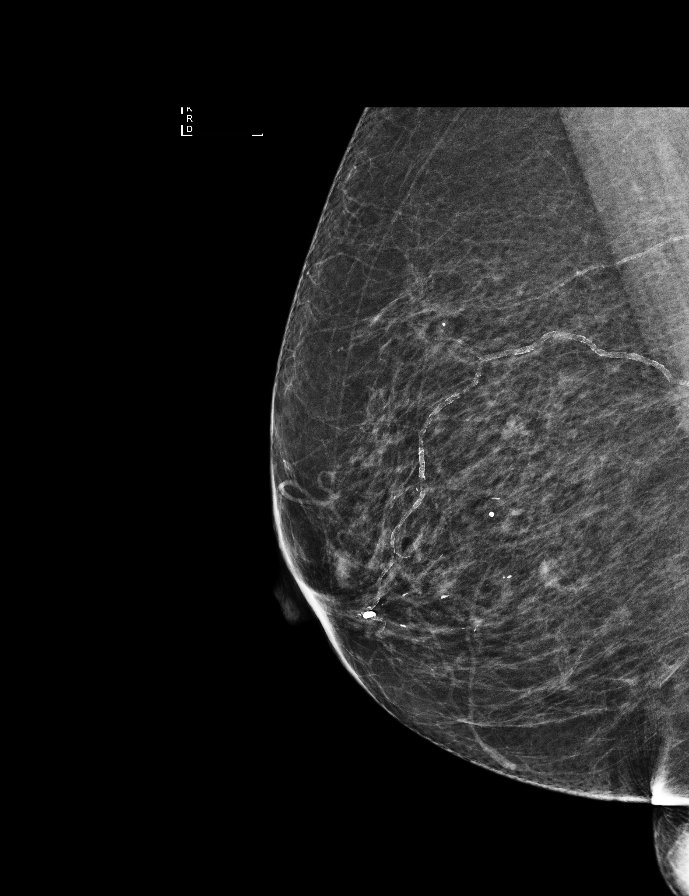

[R CC]
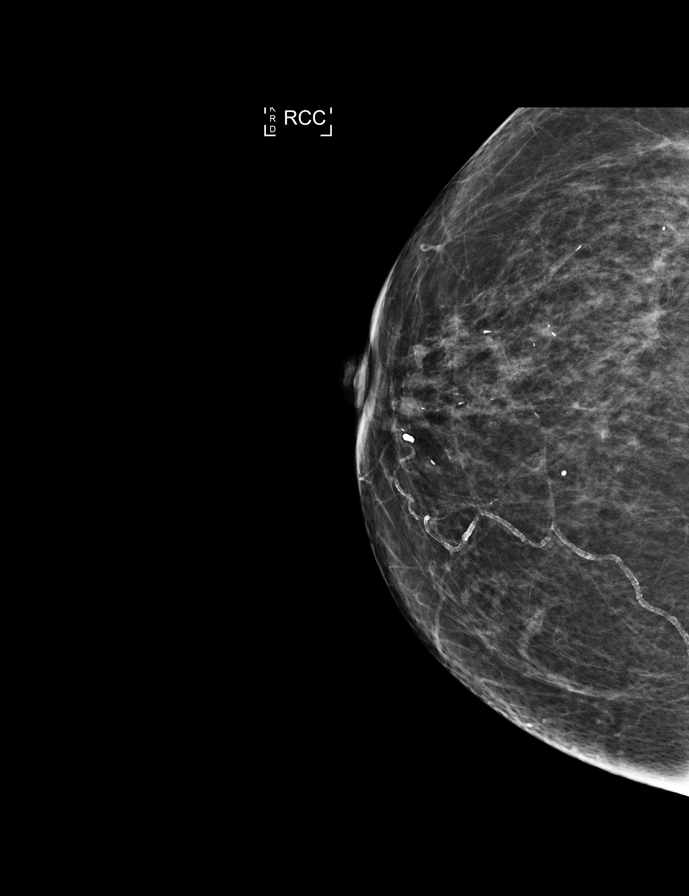

[R CC synth-2D]
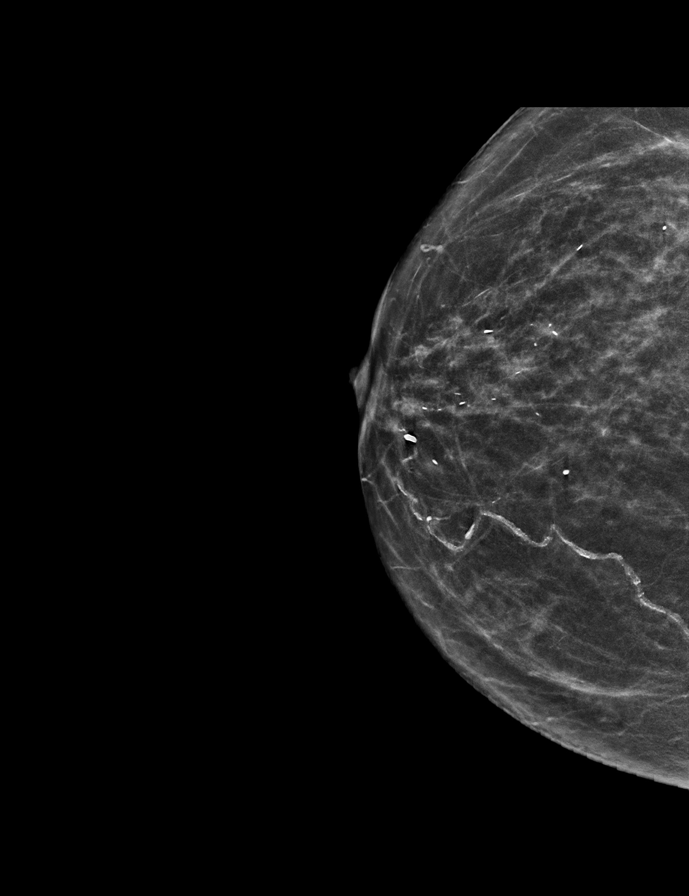

[L CC synth-2D]
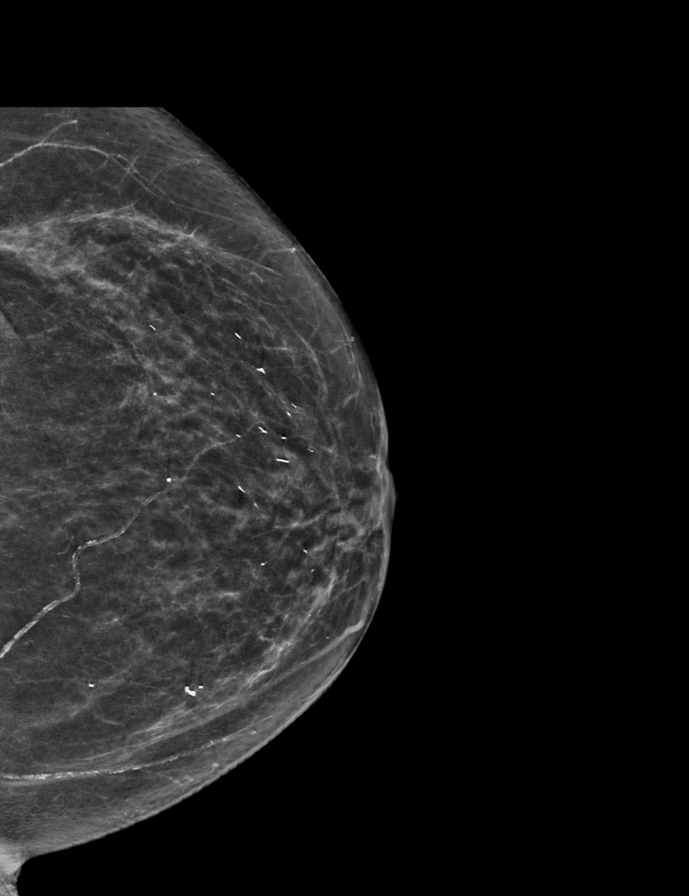

[R MLO synth-2D]
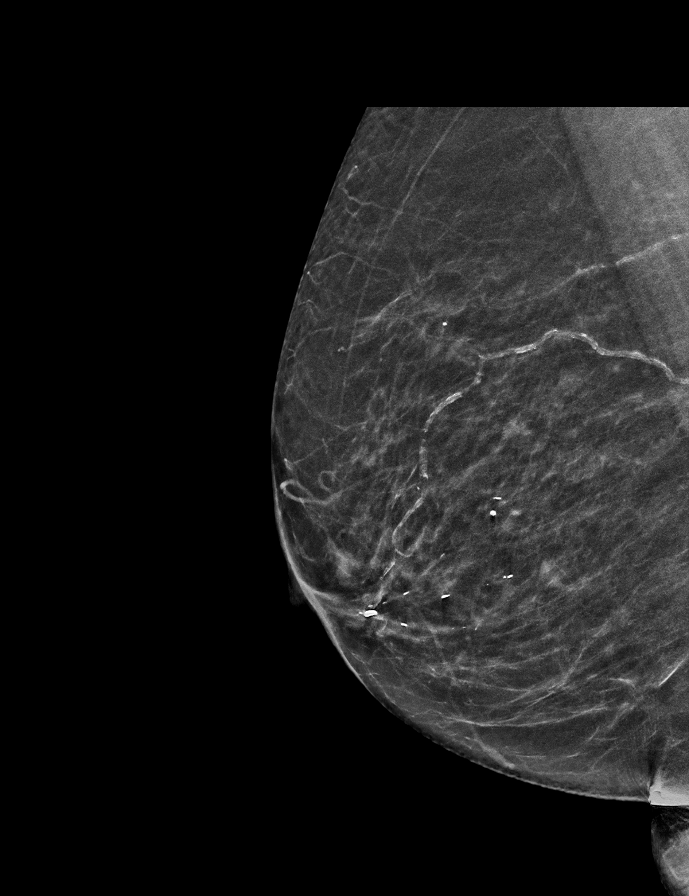

[L CC]
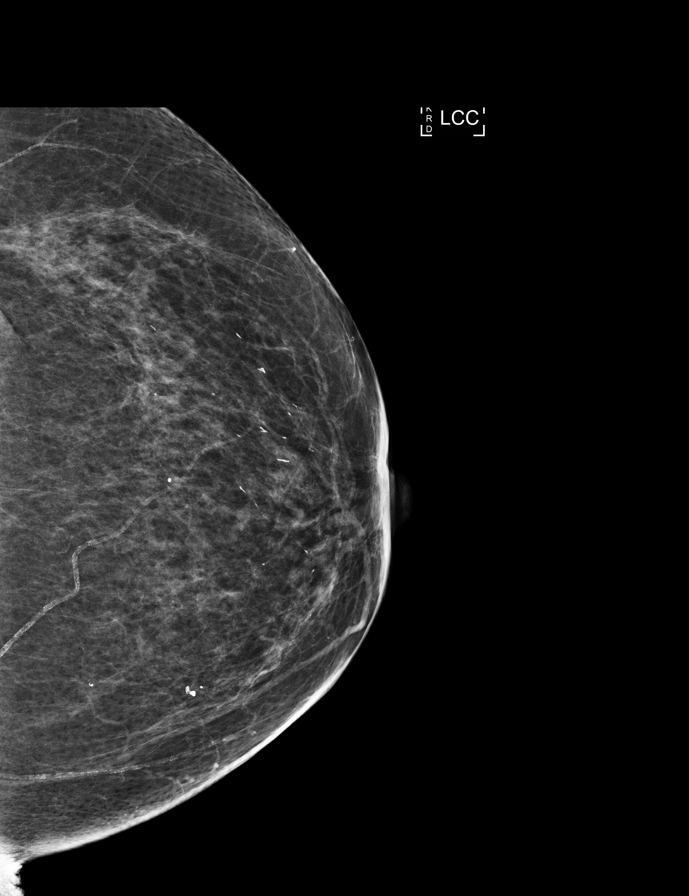

[L MLO]
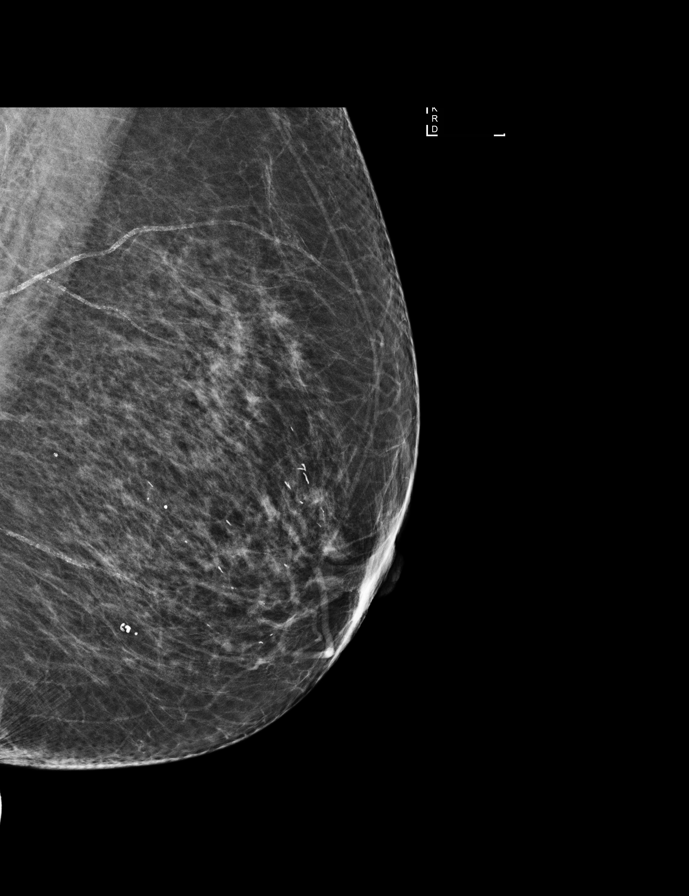

[L MLO tomo · tomo slice 30/59.0]
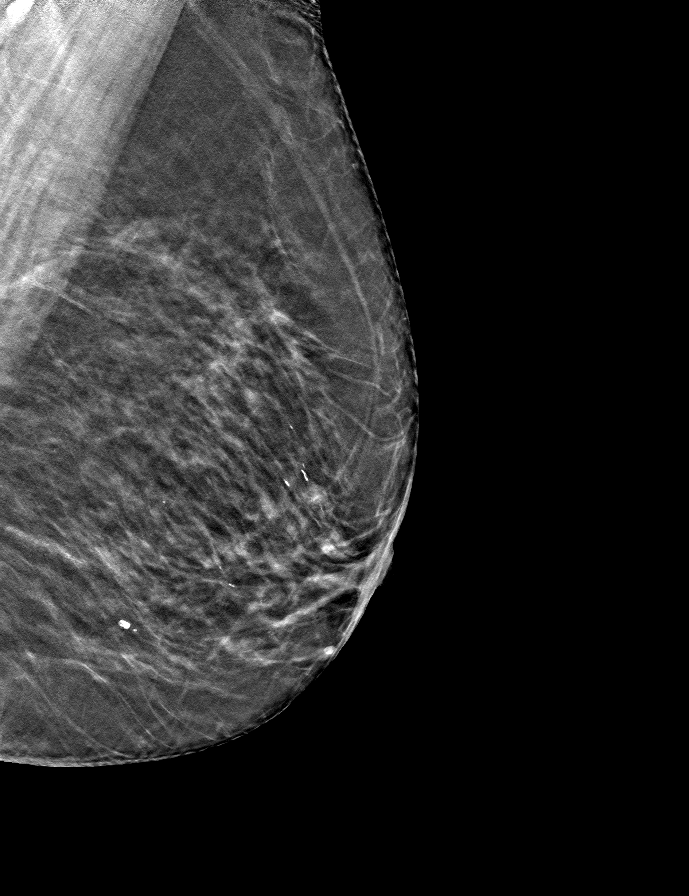

[9 of 28 positions shown; findings below may reference images not displayed]

ACR Breast Density Category b: There are scattered areas of
fibroglandular density.
FINDINGS: There are no findings suspicious for malignancy. Images were
processed with CAD.
IMPRESSION: No mammographic evidence of malignancy. A result letter of this
screening mammogram will be mailed directly to the patient.

RECOMMENDATION:
Screening mammogram in one year. (Code:[id number]-P-ZS0)

BI-RADS CATEGORY  1: Negative.

## 2018-07-11 ENCOUNTER — Other Ambulatory Visit: Payer: Self-pay

## 2018-07-11 ENCOUNTER — Ambulatory Visit
Admission: RE | Admit: 2018-07-11 | Discharge: 2018-07-11 | Disposition: A | Discharge status: Home / Self Care | Payer: Medicare Other | Source: Ambulatory Visit | Attending: Radiation Oncology | Admitting: Radiation Oncology

## 2018-07-11 ENCOUNTER — Encounter: Payer: Self-pay | Admitting: Radiation Oncology

## 2018-07-11 VITALS — BP 149/82 | HR 65 | Temp 97.8°F | Resp 18 | Wt 155.4 lb

## 2018-07-11 DIAGNOSIS — Z85828 Personal history of other malignant neoplasm of skin: Secondary | ICD-10-CM | POA: Diagnosis not present

## 2018-07-11 DIAGNOSIS — Z923 Personal history of irradiation: Secondary | ICD-10-CM | POA: Insufficient documentation

## 2018-07-11 DIAGNOSIS — C4491 Basal cell carcinoma of skin, unspecified: Secondary | ICD-10-CM

## 2018-07-11 NOTE — Progress Notes (Signed)
Radiation Oncology Follow up Note  Name: Jacqueline Tate   Date:   07/11/2018 MRN:  [id number] DOB: 26-Nov-1931    This 84 y.o. female presents to the clinic today for six-month follow-up status post electron beam ration therapy to her left nose for basal cell carcinoma the left nasal bridge.  REFERRING PROVIDER: Derinda Late, MD  HPI: patient is an 84 year old female now seen out 6 months having completed electron beam radiation therapy to her left nasal bridge for a basal cell carcinoma. Seen today in routine follow-up she is doing well. She has no evidence of disease on..  COMPLICATIONS OF TREATMENT: none  FOLLOW UP COMPLIANCE: keeps appointments   PHYSICAL EXAM:  BP (!) 149/82   Pulse 65   Temp 97.8 F (36.6 C)   Resp 18   Wt 155 lb 6.8 oz (70.5 kg)   BMI 26.68 kg/m  No evidence of lesions in the left nasal fold or any other areas on the face is identified. No evidence of subject gastric cervical or supraclavicular adenopathy.Well-developed well-nourished patient in NAD. HEENT reveals PERLA, EOMI, discs not visualized.  Oral cavity is clear. No oral mucosal lesions are identified. Neck is clear without evidence of cervical or supraclavicular adenopathy. Lungs are clear to A&P. Cardiac examination is essentially unremarkable with regular rate and rhythm without murmur rub or thrill. Abdomen is benign with no organomegaly or masses noted. Motor sensory and DTR levels are equal and symmetric in the upper and lower extremities. Cranial nerves II through XII are grossly intact. Proprioception is intact. No peripheral adenopathy or edema is identified. No motor or sensory levels are noted. Crude visual fields are within normal range.  RADIOLOGY RESULTS: no current films for review  PLAN: present time patient had a complete response with no evidence of disease at this time. I've asked her to continue close follow-up care with her dermatologist. I have asked to see her back in 1 year for  follow-up. Patient knows to call with any concerns.  I would like to take this opportunity to thank you for allowing me to participate in the care of your patient.Noreene Filbert, MD

## 2018-10-06 ENCOUNTER — Other Ambulatory Visit: Payer: Self-pay

## 2018-10-06 ENCOUNTER — Emergency Department
Admission: EM | Admit: 2018-10-06 | Discharge: 2018-10-06 | Disposition: A | Discharge status: Home / Self Care | Payer: Medicare Other | Attending: Emergency Medicine | Admitting: Emergency Medicine

## 2018-10-06 ENCOUNTER — Encounter: Payer: Self-pay | Admitting: Emergency Medicine

## 2018-10-06 ENCOUNTER — Emergency Department: Payer: Medicare Other

## 2018-10-06 DIAGNOSIS — I10 Essential (primary) hypertension: Secondary | ICD-10-CM | POA: Diagnosis not present

## 2018-10-06 DIAGNOSIS — Z85828 Personal history of other malignant neoplasm of skin: Secondary | ICD-10-CM | POA: Insufficient documentation

## 2018-10-06 DIAGNOSIS — Z91040 Latex allergy status: Secondary | ICD-10-CM | POA: Insufficient documentation

## 2018-10-06 DIAGNOSIS — R42 Dizziness and giddiness: Secondary | ICD-10-CM

## 2018-10-06 DIAGNOSIS — S0101XA Laceration without foreign body of scalp, initial encounter: Secondary | ICD-10-CM

## 2018-10-06 DIAGNOSIS — Y939 Activity, unspecified: Secondary | ICD-10-CM | POA: Insufficient documentation

## 2018-10-06 DIAGNOSIS — S0012XA Contusion of left eyelid and periocular area, initial encounter: Secondary | ICD-10-CM | POA: Insufficient documentation

## 2018-10-06 DIAGNOSIS — S0990XA Unspecified injury of head, initial encounter: Secondary | ICD-10-CM

## 2018-10-06 DIAGNOSIS — Y999 Unspecified external cause status: Secondary | ICD-10-CM | POA: Diagnosis not present

## 2018-10-06 DIAGNOSIS — W0110XA Fall on same level from slipping, tripping and stumbling with subsequent striking against unspecified object, initial encounter: Secondary | ICD-10-CM | POA: Insufficient documentation

## 2018-10-06 DIAGNOSIS — Y929 Unspecified place or not applicable: Secondary | ICD-10-CM | POA: Insufficient documentation

## 2018-10-06 DIAGNOSIS — S01112A Laceration without foreign body of left eyelid and periocular area, initial encounter: Secondary | ICD-10-CM | POA: Insufficient documentation

## 2018-10-06 LAB — URINALYSIS, COMPLETE (UACMP) WITH MICROSCOPIC
BILIRUBIN URINE: NEGATIVE
Bacteria, UA: NONE SEEN
GLUCOSE, UA: NEGATIVE mg/dL
HGB URINE DIPSTICK: NEGATIVE
Ketones, ur: NEGATIVE mg/dL
NITRITE: NEGATIVE
Protein, ur: NEGATIVE mg/dL
Specific Gravity, Urine: 1.017 (ref 1.005–1.030)
pH: 5 (ref 5.0–8.0)

## 2018-10-06 LAB — BASIC METABOLIC PANEL
ANION GAP: 9 (ref 5–15)
BUN: 25 mg/dL — AB (ref 8–23)
CHLORIDE: 103 mmol/L (ref 98–111)
CO2: 23 mmol/L (ref 22–32)
Calcium: 9 mg/dL (ref 8.9–10.3)
Creatinine, Ser: 0.74 mg/dL (ref 0.44–1.00)
Glucose, Bld: 87 mg/dL (ref 70–99)
POTASSIUM: 3.7 mmol/L (ref 3.5–5.1)
SODIUM: 135 mmol/L (ref 135–145)

## 2018-10-06 LAB — CBC
HCT: 38 % (ref 36.0–46.0)
Hemoglobin: 12.8 g/dL (ref 12.0–15.0)
MCH: 32.9 pg (ref 26.0–34.0)
MCHC: 33.7 g/dL (ref 30.0–36.0)
MCV: 97.7 fL (ref 80.0–100.0)
NRBC: 0 % (ref 0.0–0.2)
Platelets: 227 10*3/uL (ref 150–400)
RBC: 3.89 MIL/uL (ref 3.87–5.11)
RDW: 13.5 % (ref 11.5–15.5)
WBC: 5.1 10*3/uL (ref 4.0–10.5)

## 2018-10-06 NOTE — ED Triage Notes (Signed)
Pt reports she felt dizzy and fell has hematoma to left eye pt takes warfarin. Pt denies any loss of consciousness, pt talks in complete sentences no distress noted.

## 2018-10-06 NOTE — ED Provider Notes (Signed)
[facility] Emergency Department Provider Note       Time seen: ----------------------------------------- 9:18 PM on 10/06/2018 -----------------------------------------   I have reviewed the triage vital signs and the nursing notes.  HISTORY   Chief Complaint Fall; Dizziness; and Knee Pain (Left )    HPI Jacqueline Tate is a 84 y.o. female with a history of hypertension, thyroid disease, PE on Coumadin who presents to the ED for after a fall.  Patient reports she felt dizzy and she struck the left side of her face and head.  She did not pass out.  She was also having some left knee pain but is able to ambulate.  She denies any recent illness or other complaints.  Past Medical History:  Diagnosis Date  . Cancer ([place])    skin  . Hypertension   . Personal history of radiation therapy   . Thyroid disease     Patient Active Problem List   Diagnosis Date Noted  . Pulmonary embolism ([place]) 02/28/2017    Past Surgical History:  Procedure Laterality Date  . ABDOMINAL HYSTERECTOMY    . BREAST BIOPSY Bilateral    years ago- neg  . EYE SURGERY    . HERNIA REPAIR    . IR GENERIC HISTORICAL  02/28/2017   IR ANGIOGRAM SELECTIVE EACH ADDITIONAL VESSEL 02/28/2017 Markus Daft, MD [facility]  . IR GENERIC HISTORICAL  02/28/2017   IR INFUSION THROMBOL ARTERIAL INITIAL (MS) 02/28/2017 Markus Daft, MD [facility]  . IR GENERIC HISTORICAL  02/28/2017   IR INFUSION THROMBOL ARTERIAL INITIAL (MS) 02/28/2017 Markus Daft, MD [facility]  . IR GENERIC HISTORICAL  02/28/2017   IR ANGIOGRAM SELECTIVE EACH ADDITIONAL VESSEL 02/28/2017 Markus Daft, MD [facility]  . IR GENERIC HISTORICAL  02/28/2017   IR US GUIDE VASC ACCESS RIGHT 02/28/2017 Markus Daft, MD [facility]  . IR GENERIC HISTORICAL  02/28/2017   IR ANGIOGRAM PULMONARY BILATERAL SELECTIVE 02/28/2017 Markus Daft, MD [facility]  . IR GENERIC HISTORICAL  03/01/2017   IR THROMB F/U EVAL ART/VEN FINAL DAY (MS) 03/01/2017 Markus Daft, MD [facility]  . KNEE SURGERY      Allergies Codeine; Latex; Simvastatin; Penicillins; and Sulfa antibiotics  Social History Social History   Tobacco Use  . Smoking status: Never Smoker  . Smokeless tobacco: Never Used  Substance Use Topics  . Alcohol use: Not on file  . Drug use: Not on file   Review of Systems Constitutional: Negative for fever. Eyes: Negative for vision changes ENT: Positive for head injury Cardiovascular: Negative for chest pain. Respiratory: Negative for shortness of breath. Gastrointestinal: Negative for abdominal pain, vomiting and diarrhea. Musculoskeletal: Positive for left knee pain Skin: Positive for scalp laceration Neurological: Positive for headache, dizziness  All systems negative/normal/unremarkable except as stated in the HPI  ____________________________________________   PHYSICAL EXAM:  VITAL SIGNS: ED Triage Vitals  Enc Vitals Group     BP 10/06/18 1509 133/74     Pulse Rate 10/06/18 1509 81     Resp 10/06/18 1509 18     Temp 10/06/18 1509 97.8 F (36.6 C)     Temp Source 10/06/18 1509 Oral     SpO2 10/06/18 1509 95 %     Weight 10/06/18 1510 149 lb (67.6 kg)     Height 10/06/18 1510 5\' 6"  (1.676 m)     Head Circumference --      Peak Flow --      Pain Score 10/06/18 1509 8  Pain Loc --      Pain Edu? --      Excl. in [place]? --    Constitutional: Alert and oriented. Well appearing and in no distress. Eyes: Conjunctivae are normal. Normal extraocular movements. ENT   Head: Normocephalic, left facial ecchymosis with left periorbital laceration of 2 cm with no active bleeding   Nose: No congestion/rhinnorhea.   Mouth/Throat: Mucous membranes are moist.   Neck: No stridor. Cardiovascular: Normal rate, regular rhythm. No murmurs, rubs, or gallops. Respiratory: Normal respiratory effort without tachypnea nor retractions. Breath sounds are clear and equal bilaterally. No  wheezes/rales/rhonchi. Musculoskeletal: Mild pain with range of motion of the left knee but unremarkable examination.  No focal tenderness Neurologic:  Normal speech and language. No gross focal neurologic deficits are appreciated.  Skin: Left facial hematoma with periorbital ecchymosis and left periorbital laceration Psychiatric: Mood and affect are normal. Speech and behavior are normal.  ____________________________________________  EKG: Interpreted by me.  Sinus rhythm rate 73 bpm, LVH, normal axis, normal QT  ____________________________________________  ED COURSE:  As part of my medical decision making, I reviewed the following data within the [place] History obtained from family if available, nursing notes, old chart and ekg, as well as notes from prior ED visits. Patient presented for dizziness with fall, we will assess with labs and imaging as indicated at this time.   Marland Kitchen.Laceration Repair Date/Time: 10/06/2018 9:22 PM Performed by: Earleen Newport, MD Authorized by: Earleen Newport, MD   Consent:    Consent obtained:  Verbal   Consent given by:  Patient   Alternatives discussed:  No treatment Anesthesia (see MAR for exact dosages):    Anesthesia method:  None Laceration details:    Location:  Scalp   Scalp location:  Frontal   Length (cm):  2   Depth (mm):  2 Repair type:    Repair type:  Simple Treatment:    Area cleansed with:  Saline   Amount of cleaning:  Standard   Irrigation solution:  Sterile saline Skin repair:    Repair method:  Tissue adhesive Approximation:    Approximation:  Close Post-procedure details:    Patient tolerance of procedure:  Tolerated well, no immediate complications   ____________________________________________   LABS (pertinent positives/negatives)  Labs Reviewed  BASIC METABOLIC PANEL - Abnormal; Notable for the following components:      Result Value   BUN 25 (*)    All other components within  normal limits  URINALYSIS, COMPLETE (UACMP) WITH MICROSCOPIC - Abnormal; Notable for the following components:   Color, Urine YELLOW (*)    APPearance CLEAR (*)    Leukocytes, UA TRACE (*)    All other components within normal limits  CBC  CBG MONITORING, ED    RADIOLOGY Images were viewed by me  CT head IMPRESSION: 1. Preseptal soft tissue swelling of the LEFT orbit without underlying abnormality of the globe. 2. Soft tissue swelling anterior to the LEFT maxilla without underlying maxillary fracture. 3.  No evidence for acute intracranial abnormality. 4. Remote RIGHT occipital infarct. IMPRESSION: 1. No fracture. No evidence of loosening/disruption of the orthopedic hardware. ____________________________________________  DIFFERENTIAL DIAGNOSIS   Contusion, fracture, hematoma, laceration, subdural hematoma, subarachnoid hemorrhage  FINAL ASSESSMENT AND PLAN  Fall, minor head injury, laceration   Plan: The patient had presented for a minor head injury and scalp laceration. Patient's labs are reassuring although she was encouraged to drink more water. Patient's imaging not reveal any acute  process, I did discuss the remote right occipital infarct.  She is cleared for outpatient follow-up.   Laurence Aly, MD   Note: This note was generated in part or whole with voice recognition software. Voice recognition is usually quite accurate but there are transcription errors that can and very often do occur. I apologize for any typographical errors that were not detected and corrected.     Earleen Newport, MD 10/06/18 2123

## 2018-10-06 NOTE — ED Notes (Signed)
Family up to stat desk requesting update.  Updated family who verbalized understanding.

## 2018-11-20 ENCOUNTER — Other Ambulatory Visit: Payer: Self-pay | Admitting: Family Medicine

## 2018-11-20 DIAGNOSIS — Z1231 Encounter for screening mammogram for malignant neoplasm of breast: Secondary | ICD-10-CM

## 2018-12-27 ENCOUNTER — Ambulatory Visit
Admission: RE | Admit: 2018-12-27 | Discharge: 2018-12-27 | Disposition: A | Discharge status: Home / Self Care | Payer: Medicare Other | Source: Ambulatory Visit | Attending: Family Medicine | Admitting: Family Medicine

## 2018-12-27 DIAGNOSIS — Z1231 Encounter for screening mammogram for malignant neoplasm of breast: Secondary | ICD-10-CM | POA: Insufficient documentation

## 2018-12-28 DIAGNOSIS — I6523 Occlusion and stenosis of bilateral carotid arteries: Secondary | ICD-10-CM | POA: Insufficient documentation

## 2019-07-11 ENCOUNTER — Ambulatory Visit: Payer: PRIVATE HEALTH INSURANCE | Admitting: Radiation Oncology

## 2019-12-30 DIAGNOSIS — E119 Type 2 diabetes mellitus without complications: Secondary | ICD-10-CM

## 2020-01-01 ENCOUNTER — Encounter: Payer: Self-pay | Admitting: *Deleted

## 2020-01-31 ENCOUNTER — Other Ambulatory Visit: Payer: Self-pay | Admitting: Family Medicine

## 2020-01-31 DIAGNOSIS — Z1231 Encounter for screening mammogram for malignant neoplasm of breast: Secondary | ICD-10-CM

## 2020-03-05 ENCOUNTER — Ambulatory Visit: Payer: PRIVATE HEALTH INSURANCE | Admitting: Radiation Oncology

## 2020-03-12 ENCOUNTER — Ambulatory Visit: Payer: PRIVATE HEALTH INSURANCE | Admitting: Radiation Oncology

## 2020-05-20 ENCOUNTER — Ambulatory Visit: Payer: PRIVATE HEALTH INSURANCE | Admitting: Radiation Oncology

## 2020-05-27 ENCOUNTER — Ambulatory Visit
Admission: RE | Admit: 2020-05-27 | Discharge: 2020-05-27 | Disposition: A | Discharge status: Home / Self Care | Payer: Medicare Other | Source: Ambulatory Visit | Attending: Family Medicine | Admitting: Family Medicine

## 2020-05-27 DIAGNOSIS — Z1231 Encounter for screening mammogram for malignant neoplasm of breast: Secondary | ICD-10-CM | POA: Diagnosis not present

## 2020-07-08 ENCOUNTER — Ambulatory Visit: Admission: RE | Admit: 2020-07-08 | Payer: Medicare Other | Source: Ambulatory Visit | Admitting: Radiation Oncology

## 2020-08-05 DIAGNOSIS — Z86711 Personal history of pulmonary embolism: Secondary | ICD-10-CM | POA: Insufficient documentation

## 2020-08-05 DIAGNOSIS — Z86718 Personal history of other venous thrombosis and embolism: Secondary | ICD-10-CM | POA: Insufficient documentation

## 2020-09-14 ENCOUNTER — Ambulatory Visit: Payer: PRIVATE HEALTH INSURANCE | Admitting: Radiation Oncology

## 2020-09-18 ENCOUNTER — Other Ambulatory Visit: Payer: Self-pay | Admitting: Internal Medicine

## 2020-09-18 ENCOUNTER — Ambulatory Visit: Payer: Medicare Other | Attending: Internal Medicine

## 2020-09-18 DIAGNOSIS — Z23 Encounter for immunization: Secondary | ICD-10-CM

## 2020-09-18 NOTE — Progress Notes (Signed)
   Covid-19 Vaccination Clinic  Name:  Jacqueline Tate    MRN: [id number] DOB: Jun 04, 1931  09/18/2020  Jacqueline Tate was observed post Covid-19 immunization for 15 minutes without incident. She was provided with Vaccine Information Sheet and instruction to access the V-Safe system.   Jacqueline Tate was instructed to call 911 with any severe reactions post vaccine: Marland Kitchen Difficulty breathing  . Swelling of face and throat  . A fast heartbeat  . A bad rash all over body  . Dizziness and weakness

## 2020-11-09 ENCOUNTER — Ambulatory Visit: Payer: PRIVATE HEALTH INSURANCE | Admitting: Radiation Oncology

## 2020-11-13 ENCOUNTER — Ambulatory Visit: Payer: PRIVATE HEALTH INSURANCE

## 2021-01-27 ENCOUNTER — Ambulatory Visit: Payer: PRIVATE HEALTH INSURANCE | Admitting: Radiation Oncology

## 2021-08-04 ENCOUNTER — Other Ambulatory Visit: Payer: Self-pay | Admitting: Family Medicine

## 2021-08-04 DIAGNOSIS — Z1231 Encounter for screening mammogram for malignant neoplasm of breast: Secondary | ICD-10-CM

## 2021-08-20 ENCOUNTER — Ambulatory Visit
Admission: RE | Admit: 2021-08-20 | Discharge: 2021-08-20 | Disposition: A | Discharge status: Home / Self Care | Payer: Medicare Other | Source: Ambulatory Visit | Attending: Family Medicine | Admitting: Family Medicine

## 2021-08-20 ENCOUNTER — Other Ambulatory Visit: Payer: Self-pay

## 2021-08-20 DIAGNOSIS — Z1231 Encounter for screening mammogram for malignant neoplasm of breast: Secondary | ICD-10-CM | POA: Insufficient documentation

## 2021-10-12 ENCOUNTER — Ambulatory Visit: Payer: Medicare Other | Attending: Internal Medicine

## 2021-10-12 ENCOUNTER — Other Ambulatory Visit: Payer: Self-pay

## 2021-10-12 DIAGNOSIS — Z23 Encounter for immunization: Secondary | ICD-10-CM

## 2021-10-12 MED ORDER — PFIZER COVID-19 VAC BIVALENT 30 MCG/0.3ML IM SUSP
INTRAMUSCULAR | 0 refills | Status: DC
  Filled 2021-10-12: qty 0.3, 1d supply, fill #0

## 2021-10-12 NOTE — Progress Notes (Signed)
   Covid-19 Vaccination Clinic  Name:  Jacqueline Tate    MRN: [id number] DOB: 1931-05-20  10/12/2021  [name] was observed post Covid-19 immunization for 15 minutes without incident. She was provided with Vaccine Information Sheet and instruction to access the V-Safe system.   [name] was instructed to call 911 with any severe reactions post vaccine: Difficulty breathing  Swelling of face and throat  A fast heartbeat  A bad rash all over body  Dizziness and weakness   Immunizations Administered     Name Date Dose VIS Date Route   Pfizer Covid-19 Vaccine Bivalent Booster 10/12/2021  9:07 AM 0.3 mL 08/11/2021 Intramuscular   Manufacturer: [place]   Lot: [place]: [id number]      Lu Duffel, PharmD, MBA Clinical Acute Care Pharmacist

## 2022-04-07 ENCOUNTER — Emergency Department
Admission: EM | Admit: 2022-04-07 | Discharge: 2022-04-07 | Disposition: A | Discharge status: Home / Self Care | Payer: Medicare Other | Attending: Emergency Medicine | Admitting: Emergency Medicine

## 2022-04-07 ENCOUNTER — Emergency Department: Payer: Medicare Other

## 2022-04-07 ENCOUNTER — Other Ambulatory Visit: Payer: Self-pay

## 2022-04-07 ENCOUNTER — Encounter: Payer: Self-pay | Admitting: Emergency Medicine

## 2022-04-07 DIAGNOSIS — R519 Headache, unspecified: Secondary | ICD-10-CM | POA: Diagnosis not present

## 2022-04-07 DIAGNOSIS — W19XXXA Unspecified fall, initial encounter: Secondary | ICD-10-CM

## 2022-04-07 DIAGNOSIS — M546 Pain in thoracic spine: Secondary | ICD-10-CM | POA: Diagnosis present

## 2022-04-07 DIAGNOSIS — Z7901 Long term (current) use of anticoagulants: Secondary | ICD-10-CM | POA: Diagnosis not present

## 2022-04-07 DIAGNOSIS — W010XXA Fall on same level from slipping, tripping and stumbling without subsequent striking against object, initial encounter: Secondary | ICD-10-CM | POA: Diagnosis not present

## 2022-04-07 LAB — CBC
HCT: 36.2 % (ref 36.0–46.0)
Hemoglobin: 11.8 g/dL — ABNORMAL LOW (ref 12.0–15.0)
MCH: 32.3 pg (ref 26.0–34.0)
MCHC: 32.6 g/dL (ref 30.0–36.0)
MCV: 99.2 fL (ref 80.0–100.0)
Platelets: 174 10*3/uL (ref 150–400)
RBC: 3.65 MIL/uL — ABNORMAL LOW (ref 3.87–5.11)
RDW: 15.2 % (ref 11.5–15.5)
WBC: 3.7 10*3/uL — ABNORMAL LOW (ref 4.0–10.5)
nRBC: 0 % (ref 0.0–0.2)

## 2022-04-07 LAB — URINALYSIS, ROUTINE W REFLEX MICROSCOPIC
Bilirubin Urine: NEGATIVE
Glucose, UA: NEGATIVE mg/dL
Hgb urine dipstick: NEGATIVE
Ketones, ur: NEGATIVE mg/dL
Leukocytes,Ua: NEGATIVE
Nitrite: NEGATIVE
Protein, ur: NEGATIVE mg/dL
Specific Gravity, Urine: 1.014 (ref 1.005–1.030)
pH: 5 (ref 5.0–8.0)

## 2022-04-07 LAB — BASIC METABOLIC PANEL
Anion gap: 7 (ref 5–15)
BUN: 28 mg/dL — ABNORMAL HIGH (ref 8–23)
CO2: 26 mmol/L (ref 22–32)
Calcium: 9 mg/dL (ref 8.9–10.3)
Chloride: 102 mmol/L (ref 98–111)
Creatinine, Ser: 0.91 mg/dL (ref 0.44–1.00)
GFR, Estimated: >60 mL/min (ref >60)
Glucose, Bld: 104 mg/dL — ABNORMAL HIGH (ref 70–99)
Potassium: 4 mmol/L (ref 3.5–5.1)
Sodium: 135 mmol/L (ref 135–145)

## 2022-04-07 LAB — APTT: aPTT: 34 seconds (ref 24–36)

## 2022-04-07 MED ORDER — ACETAMINOPHEN 325 MG PO TABS: 650.0000 mg | ORAL_TABLET | Freq: Once | ORAL | Status: DC

## 2022-04-07 NOTE — Discharge Instructions (Signed)
You can take 650 mg of Tylenol every six hours as needed for pain.  

## 2022-04-07 NOTE — ED Notes (Signed)
Pt fell earlier today while moving a chair. Pt states she hit the back of her head. There is a small knot where pt hit her head. Pt does take Eliquis.  ?

## 2022-04-07 NOTE — ED Triage Notes (Signed)
Pt here with a fall approx 1 hr ago. Pt tripped and hit her head on a wooden chest. PT is on eliquis. Pt c/o head and right arm pain.  ?

## 2022-04-07 NOTE — ED Provider Notes (Signed)
? ?[facility] ?Provider Note ? ?Patient Contact: 5:47 PM (approximate) ? ? ?History  ? ?Fall ? ? ?HPI ? ?Jacqueline Tate is a 88 y.o. female presents to the emergency department after patient had a mechanical fall.  Patient reports that she was pushing a chair and tripped over it.  She states she had a hard fall and is currently complaining of some upper back pain that is reproducible to palpation.  Patient does take Eliquis for a PE that was diagnosed several years ago.  No chest pain, chest tightness or abdominal pain. ? ?  ? ? ?Physical Exam  ? ?Triage Vital Signs: ?ED Triage Vitals  ?Enc Vitals Group  ?   BP 04/07/22 1530 (!) 171/81  ?   Pulse Rate 04/07/22 1530 63  ?   Resp 04/07/22 1530 18  ?   Temp 04/07/22 1530 98.4 ?F (36.9 ?C)  ?   Temp Source 04/07/22 1530 Oral  ?   SpO2 04/07/22 1530 93 %  ?   Weight 04/07/22 1531 135 lb (61.2 kg)  ?   Height 04/07/22 1531 '5\' 6"'$  (1.676 m)  ?   Head Circumference --   ?   Peak Flow --   ?   Pain Score 04/07/22 1531 8  ?   Pain Loc --   ?   Pain Edu? --   ?   Excl. in [place]? --   ? ? ?Most recent vital signs: ?Vitals:  ? 04/07/22 1530 04/07/22 1747  ?BP: (!) 171/81 (!) 168/80  ?Pulse: 63 68  ?Resp: 18 18  ?Temp: 98.4 ?F (36.9 ?C)   ?SpO2: 93% 95%  ? ? ? ?General: Alert and in no acute distress. ?Eyes:  PERRL. EOMI. ?Head: No acute traumatic findings ?ENT: ?     Ears: Tms are pearly.  ?     Nose: No congestion/rhinnorhea. ?     Mouth/Throat: Mucous membranes are moist. ?Neck: No stridor. No cervical spine tenderness to palpation. ?Cardiovascular:  Good peripheral perfusion ?Respiratory: Normal respiratory effort without tachypnea or retractions. Lungs CTAB. Good air entry to the bases with no decreased or absent breath sounds. ?Gastrointestinal: Bowel sounds ?4 quadrants. Soft and nontender to palpation. No guarding or rigidity. No palpable masses. No distention. No CVA tenderness. ?Musculoskeletal: Full range of motion to all extremities.  Patient has  tenderness to palpation along the upper trapezius. ?Neurologic:  No gross focal neurologic deficits are appreciated.  ?Skin:   No rash noted ?Other: ? ? ?ED Results / Procedures / Treatments  ? ?Labs ?(all labs ordered are listed, but only abnormal results are displayed) ?Labs Reviewed  ?BASIC METABOLIC PANEL - Abnormal; Notable for the following components:  ?    Result Value  ? Glucose, Bld 104 (*)   ? BUN 28 (*)   ? All other components within normal limits  ?CBC - Abnormal; Notable for the following components:  ? WBC 3.7 (*)   ? RBC 3.65 (*)   ? Hemoglobin 11.8 (*)   ? All other components within normal limits  ?URINALYSIS, ROUTINE W REFLEX MICROSCOPIC - Abnormal; Notable for the following components:  ? Color, Urine YELLOW (*)   ? APPearance CLEAR (*)   ? All other components within normal limits  ?APTT  ?CBG MONITORING, ED  ? ? ? ? ? ?RADIOLOGY ? ?I personally viewed and evaluated these images as part of my medical decision making, as well as reviewing the written report by the radiologist. ? ?  ED Provider Interpretation: Patient has no evidence of intracranial bleed or skull fracture on CT.  I personally reviewed CT and agree with radiologist interpretation.  No evidence of pneumothorax or rib fracture on chest x-ray. ? ? ?PROCEDURES: ? ?Critical Care performed: No ? ?Procedures ? ? ?MEDICATIONS ORDERED IN ED: ?Medications  ?acetaminophen (TYLENOL) tablet 650 mg (has no administration in time range)  ? ? ? ?IMPRESSION / MDM / ASSESSMENT AND PLAN / ED COURSE  ?I reviewed the triage vital signs and the nursing notes. ?             ?               ? ?Assessment and plan:  ?Fall:  ?Differential diagnosis includes, but is not limited to, intracranial bleed, skull fracture ? ?88 year old female ?88 year old female presents to the emergency department after she had a mechanical fall. ? ?Patient was hypertensive at triage but vital signs were otherwise reassuring.  She was alert, active and nontoxic-appearing with no  neurodeficits noted.  CT of the head showed no evidence of intracranial bleed or skull fracture.  I personally reviewed chest x-ray and there was no evidence of pneumothorax.  Tylenol was recommended for discomfort and return precautions were given to return with new or worsening symptoms. ?  ? ? ?FINAL CLINICAL IMPRESSION(S) / ED DIAGNOSES  ? ?Final diagnoses:  ?Fall, initial encounter  ? ? ? ?Rx / DC Orders  ? ?ED Discharge Orders   ? ? None  ? ?  ? ? ? ?Note:  This document was prepared using Dragon voice recognition software and may include unintentional dictation errors. ?  ?Lannie Fields, PA-C ?04/07/22 1904 ? ?  ?Duffy Bruce, MD ?04/08/22 1648 ? ?

## 2022-04-07 NOTE — ED Notes (Signed)
Patient transported to X-ray 

## 2022-10-07 ENCOUNTER — Other Ambulatory Visit: Payer: Self-pay | Admitting: Family Medicine

## 2022-10-07 DIAGNOSIS — Z1231 Encounter for screening mammogram for malignant neoplasm of breast: Secondary | ICD-10-CM

## 2022-11-15 ENCOUNTER — Ambulatory Visit
Admission: RE | Admit: 2022-11-15 | Discharge: 2022-11-15 | Disposition: A | Discharge status: Home / Self Care | Payer: Medicare Other | Source: Ambulatory Visit | Attending: Family Medicine | Admitting: Family Medicine

## 2022-11-15 DIAGNOSIS — Z1231 Encounter for screening mammogram for malignant neoplasm of breast: Secondary | ICD-10-CM | POA: Diagnosis present

## 2022-11-18 ENCOUNTER — Other Ambulatory Visit: Payer: Self-pay | Admitting: Family Medicine

## 2022-11-18 DIAGNOSIS — R928 Other abnormal and inconclusive findings on diagnostic imaging of breast: Secondary | ICD-10-CM

## 2022-11-29 ENCOUNTER — Emergency Department: Payer: Medicare Other

## 2022-11-29 ENCOUNTER — Other Ambulatory Visit: Payer: Self-pay

## 2022-11-29 ENCOUNTER — Emergency Department
Admission: EM | Admit: 2022-11-29 | Discharge: 2022-11-29 | Disposition: A | Discharge status: Home / Self Care | Payer: Medicare Other | Attending: Emergency Medicine | Admitting: Emergency Medicine

## 2022-11-29 DIAGNOSIS — E039 Hypothyroidism, unspecified: Secondary | ICD-10-CM | POA: Insufficient documentation

## 2022-11-29 DIAGNOSIS — S0083XA Contusion of other part of head, initial encounter: Secondary | ICD-10-CM | POA: Diagnosis not present

## 2022-11-29 DIAGNOSIS — M1990 Unspecified osteoarthritis, unspecified site: Secondary | ICD-10-CM | POA: Insufficient documentation

## 2022-11-29 DIAGNOSIS — Z7901 Long term (current) use of anticoagulants: Secondary | ICD-10-CM | POA: Insufficient documentation

## 2022-11-29 DIAGNOSIS — S0990XA Unspecified injury of head, initial encounter: Secondary | ICD-10-CM | POA: Diagnosis present

## 2022-11-29 DIAGNOSIS — W19XXXA Unspecified fall, initial encounter: Secondary | ICD-10-CM | POA: Diagnosis not present

## 2022-11-29 DIAGNOSIS — I10 Essential (primary) hypertension: Secondary | ICD-10-CM | POA: Insufficient documentation

## 2022-11-29 NOTE — ED Notes (Signed)
Pt Dc to home. Dc instructions reviewed with all questions answered. Pt and daughter voices understanding. Pt assisted out of dept via wheelchair by EDT.

## 2022-11-29 NOTE — Discharge Instructions (Signed)
Continue the tylenol. You can take an additional dose if needed just do not exceed 4000 mg daily.  Use the Voltaren gel to any areas of pain.  Try over-the-counter Lidoderm patches as well to help with areas of discomfort.

## 2022-11-29 NOTE — ED Provider Notes (Signed)
[facility] Provider Note    Event Date/Time   First MD Initiated Contact with Patient 11/29/22 2135     (approximate)   History   Fall (Blood Thinner )   HPI  Jacqueline Tate is a 88 y.o. female here with fall.  The patient states that she was in her house today when her feet got reportedly caught up on her husband's walker.  She fell to the ground.  The right side of her face and side seem to take the majority of the trauma.  She is on blood thinners with subsequent presents for evaluation.  She states that she was well prior to the fall and family confirmed that she is at her baseline mental status.  No recent illnesses.  No urinary symptoms.     Physical Exam   Triage Vital Signs: ED Triage Vitals  Enc Vitals Group     BP 11/29/22 1928 (!) 178/92     Pulse Rate 11/29/22 1928 80     Resp 11/29/22 1928 16     Temp 11/29/22 1928 98.2 F (36.8 C)     Temp Source 11/29/22 1928 Oral     SpO2 11/29/22 1928 95 %     Weight 11/29/22 1941 135 lb (61.2 kg)     Height 11/29/22 1941 '5\' 6"'$  (1.676 m)     Head Circumference --      Peak Flow --      Pain Score 11/29/22 1941 8     Pain Loc --      Pain Edu? --      Excl. in [place]? --     Most recent vital signs: Vitals:   11/29/22 1928  BP: (!) 178/92  Pulse: 80  Resp: 16  Temp: 98.2 F (36.8 C)  SpO2: 95%     General: Awake, no distress.  CV:  Good peripheral perfusion.  Regular rate and rhythm. Resp:  Normal effort.  Lungs clear. Abd:  No distention.  Other:  Small area of ecchymosis to the right lower jaw.  No deformity.  Otherwise no signs of facial trauma.  No midline cervical, thoracic, or lumbar tenderness.  Specifically, no tenderness throughout the upper thoracic spine.  Mild tenderness over the left shoulder, right knee.  No deformity.  Mild tenderness over the anterior chest wall but no bruising or deformity.  Normal work of breathing.   ED Results / Procedures / Treatments    Labs (all labs ordered are listed, but only abnormal results are displayed) Labs Reviewed - No data to display   EKG    RADIOLOGY DG shoulder left: Osteopenia, no acute fracture Chest x-ray: Clear DD shoulder right: Osteopenia, no acute abnormality DG knee right: Negative DG pelvis: Negative CT maxillofacial: Negative for acute abnormality CT C-spine: Possible mild compression fracture of the superior endplate of T2, indeterminate age CT head: No acute intracranial malady   I also independently reviewed and agree with radiologist interpretations.   PROCEDURES:  Critical Care performed: No    MEDICATIONS ORDERED IN ED: Medications - No data to display   IMPRESSION / MDM / [place] / ED COURSE  I reviewed the triage vital signs and the nursing notes.                              Differential diagnosis includes, but is not limited to, mechanical fall, contusion, sprain/strain, ICH  Patient's presentation is  most consistent with acute presentation with potential threat to life or bodily function.  88 yo F with PMHx DVT on coumadin,  HTN, hypothyroidism, here with fall. Fall was mechanical in nature and pt is adamant she is at her baseline otherwise. Imaging obtained, reviewed by me and is negative for any acute injury. T2 SEP fx noted on CT is non-tender on exam, do not suspect this is acute. She is tolerating PO and well appearing, ambulating now at her baseline. Will encourage tylenol, outpt follow-up.    FINAL CLINICAL IMPRESSION(S) / ED DIAGNOSES   Final diagnoses:  Fall, initial encounter  Contusion of face, initial encounter  Arthritis     Rx / DC Orders   ED Discharge Orders     None        Note:  This document was prepared using Dragon voice recognition software and may include unintentional dictation errors.   Duffy Bruce, MD 11/30/22 [phone]

## 2022-11-29 NOTE — ED Notes (Signed)
Discussed pt with ED Jacqueline Tate, B. Verbal orders for CT given and placed.

## 2022-11-29 NOTE — ED Triage Notes (Addendum)
Fall on Eliquis   Pt had a mechanical fall earlier today around 4 pm falling on her right side. No LOC. Presents to ED A&Ox4. Has ecchymosis to right side of face, right side jaw pain, right knee pain, hip pain, and right shoulder.

## 2022-11-29 NOTE — ED Notes (Signed)
While in imaging pt began to c/o left side shoulder pain. XR added per triage order set.

## 2022-12-06 ENCOUNTER — Ambulatory Visit
Admission: RE | Admit: 2022-12-06 | Discharge: 2022-12-06 | Disposition: A | Discharge status: Home / Self Care | Payer: Medicare Other | Source: Ambulatory Visit | Attending: Family Medicine | Admitting: Family Medicine

## 2022-12-06 DIAGNOSIS — R928 Other abnormal and inconclusive findings on diagnostic imaging of breast: Secondary | ICD-10-CM | POA: Insufficient documentation

## 2023-02-23 DIAGNOSIS — F0280 Dementia in other diseases classified elsewhere without behavioral disturbance: Secondary | ICD-10-CM | POA: Diagnosis present

## 2023-03-26 ENCOUNTER — Emergency Department: Payer: Medicare Other

## 2023-03-26 ENCOUNTER — Inpatient Hospital Stay
Admission: EM | Admit: 2023-03-26 | Discharge: 2023-04-03 | DRG: 871 | Disposition: A | Discharge status: Home Health | Payer: Medicare Other | Attending: Internal Medicine | Admitting: Internal Medicine

## 2023-03-26 ENCOUNTER — Other Ambulatory Visit: Payer: Self-pay

## 2023-03-26 ENCOUNTER — Observation Stay: Payer: Medicare Other

## 2023-03-26 DIAGNOSIS — Z888 Allergy status to other drugs, medicaments and biological substances status: Secondary | ICD-10-CM

## 2023-03-26 DIAGNOSIS — K625 Hemorrhage of anus and rectum: Secondary | ICD-10-CM | POA: Diagnosis not present

## 2023-03-26 DIAGNOSIS — Z880 Allergy status to penicillin: Secondary | ICD-10-CM

## 2023-03-26 DIAGNOSIS — Z515 Encounter for palliative care: Secondary | ICD-10-CM

## 2023-03-26 DIAGNOSIS — Z85828 Personal history of other malignant neoplasm of skin: Secondary | ICD-10-CM

## 2023-03-26 DIAGNOSIS — I10 Essential (primary) hypertension: Secondary | ICD-10-CM

## 2023-03-26 DIAGNOSIS — Z881 Allergy status to other antibiotic agents status: Secondary | ICD-10-CM

## 2023-03-26 DIAGNOSIS — Z66 Do not resuscitate: Secondary | ICD-10-CM | POA: Diagnosis present

## 2023-03-26 DIAGNOSIS — N179 Acute kidney failure, unspecified: Secondary | ICD-10-CM

## 2023-03-26 DIAGNOSIS — Z923 Personal history of irradiation: Secondary | ICD-10-CM

## 2023-03-26 DIAGNOSIS — N390 Urinary tract infection, site not specified: Secondary | ICD-10-CM | POA: Diagnosis present

## 2023-03-26 DIAGNOSIS — R627 Adult failure to thrive: Secondary | ICD-10-CM | POA: Diagnosis present

## 2023-03-26 DIAGNOSIS — Z791 Long term (current) use of non-steroidal anti-inflammatories (NSAID): Secondary | ICD-10-CM

## 2023-03-26 DIAGNOSIS — D539 Nutritional anemia, unspecified: Secondary | ICD-10-CM

## 2023-03-26 DIAGNOSIS — D6959 Other secondary thrombocytopenia: Secondary | ICD-10-CM | POA: Diagnosis present

## 2023-03-26 DIAGNOSIS — A419 Sepsis, unspecified organism: Secondary | ICD-10-CM

## 2023-03-26 DIAGNOSIS — D696 Thrombocytopenia, unspecified: Secondary | ICD-10-CM | POA: Insufficient documentation

## 2023-03-26 DIAGNOSIS — Z1152 Encounter for screening for COVID-19: Secondary | ICD-10-CM

## 2023-03-26 DIAGNOSIS — E872 Acidosis, unspecified: Secondary | ICD-10-CM | POA: Diagnosis present

## 2023-03-26 DIAGNOSIS — G9340 Encephalopathy, unspecified: Secondary | ICD-10-CM

## 2023-03-26 DIAGNOSIS — G309 Alzheimer's disease, unspecified: Secondary | ICD-10-CM | POA: Insufficient documentation

## 2023-03-26 DIAGNOSIS — D72829 Elevated white blood cell count, unspecified: Secondary | ICD-10-CM

## 2023-03-26 DIAGNOSIS — Z90710 Acquired absence of both cervix and uterus: Secondary | ICD-10-CM

## 2023-03-26 DIAGNOSIS — Z885 Allergy status to narcotic agent status: Secondary | ICD-10-CM

## 2023-03-26 DIAGNOSIS — Z6824 Body mass index (BMI) 24.0-24.9, adult: Secondary | ICD-10-CM

## 2023-03-26 DIAGNOSIS — Z882 Allergy status to sulfonamides status: Secondary | ICD-10-CM

## 2023-03-26 DIAGNOSIS — G301 Alzheimer's disease with late onset: Secondary | ICD-10-CM

## 2023-03-26 DIAGNOSIS — F02818 Dementia in other diseases classified elsewhere, unspecified severity, with other behavioral disturbance: Secondary | ICD-10-CM | POA: Diagnosis present

## 2023-03-26 DIAGNOSIS — Z86711 Personal history of pulmonary embolism: Secondary | ICD-10-CM

## 2023-03-26 DIAGNOSIS — A4151 Sepsis due to Escherichia coli [E. coli]: Principal | ICD-10-CM | POA: Diagnosis present

## 2023-03-26 DIAGNOSIS — K648 Other hemorrhoids: Secondary | ICD-10-CM | POA: Diagnosis present

## 2023-03-26 DIAGNOSIS — E876 Hypokalemia: Secondary | ICD-10-CM | POA: Diagnosis present

## 2023-03-26 DIAGNOSIS — R531 Weakness: Principal | ICD-10-CM

## 2023-03-26 DIAGNOSIS — Z79899 Other long term (current) drug therapy: Secondary | ICD-10-CM

## 2023-03-26 DIAGNOSIS — Z86718 Personal history of other venous thrombosis and embolism: Secondary | ICD-10-CM

## 2023-03-26 DIAGNOSIS — R791 Abnormal coagulation profile: Secondary | ICD-10-CM | POA: Diagnosis present

## 2023-03-26 DIAGNOSIS — E1122 Type 2 diabetes mellitus with diabetic chronic kidney disease: Secondary | ICD-10-CM | POA: Diagnosis present

## 2023-03-26 DIAGNOSIS — D631 Anemia in chronic kidney disease: Secondary | ICD-10-CM | POA: Diagnosis present

## 2023-03-26 DIAGNOSIS — N1831 Chronic kidney disease, stage 3a: Secondary | ICD-10-CM | POA: Diagnosis present

## 2023-03-26 DIAGNOSIS — I129 Hypertensive chronic kidney disease with stage 1 through stage 4 chronic kidney disease, or unspecified chronic kidney disease: Secondary | ICD-10-CM | POA: Diagnosis present

## 2023-03-26 DIAGNOSIS — R0902 Hypoxemia: Secondary | ICD-10-CM | POA: Diagnosis present

## 2023-03-26 DIAGNOSIS — D509 Iron deficiency anemia, unspecified: Secondary | ICD-10-CM | POA: Diagnosis present

## 2023-03-26 DIAGNOSIS — R630 Anorexia: Secondary | ICD-10-CM | POA: Diagnosis present

## 2023-03-26 DIAGNOSIS — Z79890 Hormone replacement therapy (postmenopausal): Secondary | ICD-10-CM

## 2023-03-26 DIAGNOSIS — E119 Type 2 diabetes mellitus without complications: Secondary | ICD-10-CM

## 2023-03-26 DIAGNOSIS — E871 Hypo-osmolality and hyponatremia: Secondary | ICD-10-CM | POA: Insufficient documentation

## 2023-03-26 DIAGNOSIS — K644 Residual hemorrhoidal skin tags: Secondary | ICD-10-CM | POA: Diagnosis present

## 2023-03-26 DIAGNOSIS — G9341 Metabolic encephalopathy: Secondary | ICD-10-CM | POA: Diagnosis present

## 2023-03-26 DIAGNOSIS — R6520 Severe sepsis without septic shock: Secondary | ICD-10-CM | POA: Diagnosis present

## 2023-03-26 DIAGNOSIS — Z7901 Long term (current) use of anticoagulants: Secondary | ICD-10-CM

## 2023-03-26 DIAGNOSIS — E039 Hypothyroidism, unspecified: Secondary | ICD-10-CM

## 2023-03-26 DIAGNOSIS — F0280 Dementia in other diseases classified elsewhere without behavioral disturbance: Secondary | ICD-10-CM

## 2023-03-26 LAB — RESP PANEL BY RT-PCR (RSV, FLU A&B, COVID)  RVPGX2
Influenza A by PCR: NEGATIVE
Influenza B by PCR: NEGATIVE
Resp Syncytial Virus by PCR: NEGATIVE
SARS Coronavirus 2 by RT PCR: NEGATIVE

## 2023-03-26 LAB — CBC WITH DIFFERENTIAL/PLATELET
Abs Immature Granulocytes: 0.12 10*3/uL — ABNORMAL HIGH (ref 0.00–0.07)
Basophils Absolute: 0.1 10*3/uL (ref 0.0–0.1)
Basophils Relative: 1 %
Eosinophils Absolute: 0 10*3/uL (ref 0.0–0.5)
Eosinophils Relative: 0 %
HCT: 29.9 % — ABNORMAL LOW (ref 36.0–46.0)
Hemoglobin: 9.7 g/dL — ABNORMAL LOW (ref 12.0–15.0)
Immature Granulocytes: 1 %
Lymphocytes Relative: 10 %
Lymphs Abs: 1.1 10*3/uL (ref 0.7–4.0)
MCH: 32.7 pg (ref 26.0–34.0)
MCHC: 32.4 g/dL (ref 30.0–36.0)
MCV: 100.7 fL — ABNORMAL HIGH (ref 80.0–100.0)
Monocytes Absolute: 0.5 10*3/uL (ref 0.1–1.0)
Monocytes Relative: 5 %
Neutro Abs: 8.9 10*3/uL — ABNORMAL HIGH (ref 1.7–7.7)
Neutrophils Relative %: 83 %
Platelets: 171 10*3/uL (ref 150–400)
RBC: 2.97 MIL/uL — ABNORMAL LOW (ref 3.87–5.11)
RDW: 17.3 % — ABNORMAL HIGH (ref 11.5–15.5)
WBC: 10.7 10*3/uL — ABNORMAL HIGH (ref 4.0–10.5)
nRBC: 0.2 % (ref 0.0–0.2)

## 2023-03-26 LAB — URINALYSIS, W/ REFLEX TO CULTURE (INFECTION SUSPECTED)
Bilirubin Urine: NEGATIVE
Glucose, UA: NEGATIVE mg/dL
Ketones, ur: 5 mg/dL — AB
Nitrite: NEGATIVE
Protein, ur: 30 mg/dL — AB
Specific Gravity, Urine: 1.013 (ref 1.005–1.030)
pH: 5 (ref 5.0–8.0)

## 2023-03-26 LAB — COMPREHENSIVE METABOLIC PANEL
ALT: 28 U/L (ref 0–44)
ALT: 29 U/L (ref 0–44)
AST: 58 U/L — ABNORMAL HIGH (ref 15–41)
AST: 46 U/L — ABNORMAL HIGH (ref 15–41)
Albumin: 3 g/dL — ABNORMAL LOW (ref 3.5–5.0)
Albumin: 3.2 g/dL — ABNORMAL LOW (ref 3.5–5.0)
Alkaline Phosphatase: 95 U/L (ref 38–126)
Alkaline Phosphatase: 114 U/L (ref 38–126)
Anion gap: 11 (ref 5–15)
Anion gap: 11 (ref 5–15)
BUN: 43 mg/dL — ABNORMAL HIGH (ref 8–23)
BUN: 38 mg/dL — ABNORMAL HIGH (ref 8–23)
CO2: 20 mmol/L — ABNORMAL LOW (ref 22–32)
CO2: 20 mmol/L — ABNORMAL LOW (ref 22–32)
Calcium: 8.2 mg/dL — ABNORMAL LOW (ref 8.9–10.3)
Calcium: 8.3 mg/dL — ABNORMAL LOW (ref 8.9–10.3)
Chloride: 103 mmol/L (ref 98–111)
Chloride: 104 mmol/L (ref 98–111)
Creatinine, Ser: 1.88 mg/dL — ABNORMAL HIGH (ref 0.44–1.00)
Creatinine, Ser: 1.35 mg/dL — ABNORMAL HIGH (ref 0.44–1.00)
GFR, Estimated: 25 mL/min — ABNORMAL LOW (ref >60)
GFR, Estimated: 38 mL/min — ABNORMAL LOW (ref >60)
Glucose, Bld: 118 mg/dL — ABNORMAL HIGH (ref 70–99)
Glucose, Bld: 142 mg/dL — ABNORMAL HIGH (ref 70–99)
Potassium: 4.3 mmol/L (ref 3.5–5.1)
Potassium: 3.3 mmol/L — ABNORMAL LOW (ref 3.5–5.1)
Sodium: 134 mmol/L — ABNORMAL LOW (ref 135–145)
Sodium: 135 mmol/L (ref 135–145)
Total Bilirubin: 1.4 mg/dL — ABNORMAL HIGH (ref 0.3–1.2)
Total Bilirubin: 1.4 mg/dL — ABNORMAL HIGH (ref 0.3–1.2)
Total Protein: 5.8 g/dL — ABNORMAL LOW (ref 6.5–8.1)
Total Protein: 6.5 g/dL (ref 6.5–8.1)

## 2023-03-26 LAB — SAMPLE TO BLOOD BANK

## 2023-03-26 LAB — TROPONIN I (HIGH SENSITIVITY)
Troponin I (High Sensitivity): 27 ng/L — ABNORMAL HIGH (ref <18)
Troponin I (High Sensitivity): 22 ng/L — ABNORMAL HIGH (ref <18)

## 2023-03-26 LAB — BLOOD GAS, VENOUS: Patient temperature: 37

## 2023-03-26 LAB — PROTIME-INR
INR: 2 — ABNORMAL HIGH (ref 0.8–1.2)
Prothrombin Time: 22.7 seconds — ABNORMAL HIGH (ref 11.4–15.2)

## 2023-03-26 LAB — GLUCOSE, CAPILLARY: Glucose-Capillary: 143 mg/dL — ABNORMAL HIGH (ref 70–99)

## 2023-03-26 LAB — TSH: TSH: 0.945 u[IU]/mL (ref 0.350–4.500)

## 2023-03-26 LAB — LACTIC ACID, PLASMA: Lactic Acid, Venous: 1.4 mmol/L (ref 0.5–1.9)

## 2023-03-26 LAB — LIPASE, BLOOD: Lipase: 25 U/L (ref 11–51)

## 2023-03-26 MED ORDER — CYCLOBENZAPRINE HCL 5 MG PO TABS
5.0000 mg | ORAL_TABLET | Freq: Every day | ORAL | Status: DC
  Filled 2023-03-26: qty 1

## 2023-03-26 MED ORDER — DULOXETINE HCL 30 MG PO CPEP
60.0000 mg | ORAL_CAPSULE | Freq: Every day | ORAL | Status: DC
  Administered 2023-03-26 – 2023-04-03 (×9): 60 mg via ORAL
  Filled 2023-03-26 (×8): qty 2
  Filled 2023-03-26: qty 1
  Filled 2023-03-26: qty 2

## 2023-03-26 MED ORDER — APIXABAN 5 MG PO TABS
5.0000 mg | ORAL_TABLET | Freq: Two times a day (BID) | ORAL | Status: DC
  Administered 2023-03-27 – 2023-04-03 (×12): 5 mg via ORAL
  Filled 2023-03-26 (×15): qty 1

## 2023-03-26 MED ORDER — ONDANSETRON HCL 4 MG PO TABS: 4.0000 mg | ORAL_TABLET | Freq: Four times a day (QID) | ORAL | Status: DC | PRN

## 2023-03-26 MED ORDER — LEVOFLOXACIN IN D5W 500 MG/100ML IV SOLN
500.0000 mg | INTRAVENOUS | Status: DC
  Administered 2023-03-26: 500 mg via INTRAVENOUS
  Filled 2023-03-26: qty 100

## 2023-03-26 MED ORDER — QUETIAPINE FUMARATE 25 MG PO TABS
50.0000 mg | ORAL_TABLET | Freq: Every day | ORAL | Status: DC
  Administered 2023-03-28 – 2023-04-02 (×6): 50 mg via ORAL
  Filled 2023-03-26 (×7): qty 2

## 2023-03-26 MED ORDER — METOCLOPRAMIDE HCL 5 MG PO TABS
5.0000 mg | ORAL_TABLET | Freq: Every day | ORAL | Status: DC
  Filled 2023-03-26: qty 1

## 2023-03-26 MED ORDER — INSULIN ASPART 100 UNIT/ML IJ SOLN
0.0000 [IU] | Freq: Three times a day (TID) | INTRAMUSCULAR | Status: DC
  Administered 2023-03-29: 1 [IU] via SUBCUTANEOUS
  Filled 2023-03-26: qty 1

## 2023-03-26 MED ORDER — LEVOTHYROXINE SODIUM 50 MCG PO TABS
50.0000 ug | ORAL_TABLET | Freq: Every day | ORAL | Status: DC
  Administered 2023-03-29 – 2023-04-02 (×5): 50 ug via ORAL
  Filled 2023-03-26 (×6): qty 1

## 2023-03-26 MED ORDER — ACETAMINOPHEN 650 MG RE SUPP
650.0000 mg | Freq: Four times a day (QID) | RECTAL | Status: DC | PRN
  Filled 2023-03-26: qty 2

## 2023-03-26 MED ORDER — METOPROLOL TARTRATE 50 MG PO TABS
50.0000 mg | ORAL_TABLET | Freq: Two times a day (BID) | ORAL | Status: DC
  Administered 2023-03-27 – 2023-04-03 (×11): 50 mg via ORAL
  Filled 2023-03-26 (×14): qty 1

## 2023-03-26 MED ORDER — LACTATED RINGERS IV BOLUS
1000.0000 mL | Freq: Once | INTRAVENOUS | Status: AC
  Administered 2023-03-26: 1000 mL via INTRAVENOUS

## 2023-03-26 MED ORDER — SODIUM CHLORIDE 0.9% FLUSH
3.0000 mL | Freq: Two times a day (BID) | INTRAVENOUS | Status: DC
  Administered 2023-03-26 – 2023-04-03 (×15): 3 mL via INTRAVENOUS

## 2023-03-26 MED ORDER — SODIUM CHLORIDE 0.9 % IV BOLUS
1000.0000 mL | Freq: Once | INTRAVENOUS | Status: AC
  Administered 2023-03-26: 1000 mL via INTRAVENOUS

## 2023-03-26 MED ORDER — HYDROCORTISONE 1 % EX CREA
TOPICAL_CREAM | Freq: Two times a day (BID) | CUTANEOUS | Status: AC
  Filled 2023-03-26: qty 28

## 2023-03-26 MED ORDER — GABAPENTIN 300 MG PO CAPS
600.0000 mg | ORAL_CAPSULE | Freq: Three times a day (TID) | ORAL | Status: DC
  Administered 2023-03-26: 600 mg via ORAL
  Filled 2023-03-26: qty 2

## 2023-03-26 MED ORDER — QUETIAPINE FUMARATE 25 MG PO TABS
25.0000 mg | ORAL_TABLET | Freq: Every day | ORAL | Status: DC
  Administered 2023-03-30 – 2023-04-01 (×3): 25 mg via ORAL
  Filled 2023-03-26 (×5): qty 1

## 2023-03-26 MED ORDER — ACETAMINOPHEN 325 MG PO TABS
650.0000 mg | ORAL_TABLET | Freq: Four times a day (QID) | ORAL | Status: DC | PRN
  Administered 2023-03-26 – 2023-04-02 (×4): 650 mg via ORAL
  Filled 2023-03-26 (×8): qty 2

## 2023-03-26 MED ORDER — LACTATED RINGERS IV SOLN: INTRAVENOUS | Status: DC

## 2023-03-26 MED ORDER — ONDANSETRON HCL 4 MG/2ML IJ SOLN: 4.0000 mg | Freq: Four times a day (QID) | INTRAMUSCULAR | Status: DC | PRN

## 2023-03-26 NOTE — H&P (Signed)
History and Physical    Patient: Jacqueline Tate ZOX:[id number] DOB: 08-13-1931 DOA: 03/26/2023 DOS: the patient was seen and examined on 03/26/2023 PCP: Kandyce Rud, MD  Patient coming from: Home  Chief Complaint:  Chief Complaint  Patient presents with   Weakness   HPI: Jacqueline Tate is a 89 y.o. female with medical history significant of Alzheimer's dementia, hypertension, hypothyroidism, type 2 diabetes, CKD stage IIIa, previous DVT/PE on [facility], who presents to the ED with complaints of generalized weakness.  History obtained from patient's daughter at bedside due to patient's altered mental status.  Patient's daughter Jacqueline Tate states that for the last 1 week, Jacqueline Tate has been refusing to eat and drink but was able to eat some food approximately 2 days ago and did have some propel this morning after coaxing.  During this time, they have noted that she is becoming increasingly weak throughout.  No focal weakness noted, but just that patient is having trouble walking with her walker and standing up on her own.    Family denies fever, chills, nausea, vomiting, diarrhea, abdominal pain, chest pain or shortness of breath.  They note that Jacqueline Tate has a hemorrhoid that has been bleeding on and off.  In addition, this morning they saw that she had some bleeding from her mouth and believes she may have bit her lip.  This morning, Jacqueline Tate states patient's urine was a light brown color.  She notes that Jacqueline Tate has a history of forgetfulness consistent with Alzheimer's dementia but it has become worse since the passing of Jacqueline Tate's husband approximately 6 weeks ago.  ED course: On arrival to the ED, patient was normotensive at 125/68 with heart rate of 82.  She was saturating at 98% on 2 L of supplemental oxygen, she was afebrile at 98.4.  Initial workup notable for WBC of 10.7, hemoglobin 9.7, MCV of 100.7, sodium 134, bicarb 20, glucose 118, BUN 43, creatinine 1.88, albumin 3.0, AST  58, total bilirubin 1.4 and GFR of 25.  Initial troponin elevated at 27 with downtrend to 22.  INR elevated at 2.0.  COVID-19, influenza and RSV PCR negative.  CT of the head was obtained that did not show any acute intracranial abnormality.  Chest x-ray was obtained that was negative.  Patient started on IV fluids and [facility] contacted for admission.  Review of Systems: As mentioned in the history of present illness. All other systems reviewed and are negative.  Past Medical History:  Diagnosis Date   Cancer    skin   Hypertension    Personal history of radiation therapy    for skin ca   Thyroid disease    Past Surgical History:  Procedure Laterality Date   ABDOMINAL HYSTERECTOMY     BREAST BIOPSY Bilateral    years ago- neg   EYE SURGERY     HERNIA REPAIR     IR GENERIC HISTORICAL  02/28/2017   IR ANGIOGRAM SELECTIVE EACH ADDITIONAL VESSEL 02/28/2017 Richarda Overlie, MD [facility]   IR GENERIC HISTORICAL  02/28/2017   IR INFUSION THROMBOL ARTERIAL INITIAL (MS) 02/28/2017 Richarda Overlie, MD [facility]   IR GENERIC HISTORICAL  02/28/2017   IR INFUSION THROMBOL ARTERIAL INITIAL (MS) 02/28/2017 Richarda Overlie, MD [facility]   IR GENERIC HISTORICAL  02/28/2017   IR ANGIOGRAM SELECTIVE EACH ADDITIONAL VESSEL 02/28/2017 Richarda Overlie, MD [facility]   IR GENERIC HISTORICAL  02/28/2017   IR US GUIDE VASC ACCESS RIGHT 02/28/2017 Richarda Overlie, MD [facility]  IR GENERIC HISTORICAL  02/28/2017   IR ANGIOGRAM PULMONARY BILATERAL SELECTIVE 02/28/2017 Richarda Overlie, MD [facility]   IR GENERIC HISTORICAL  03/01/2017   IR THROMB F/U EVAL ART/VEN FINAL DAY (MS) 03/01/2017 Richarda Overlie, MD [facility]   KNEE SURGERY     Social History:  reports that she has never smoked. She has never used smokeless tobacco. No history on file for alcohol use and drug use.  Allergies  Allergen Reactions   Codeine Nausea And Vomiting   Latex    Simvastatin Other (See Comments)    Other reaction(s): Muscle Pain Weakness to leg  muscles & weakness Weakness to leg muscles    Penicillins Rash    Has patient had a PCN reaction causing immediate rash, facial/tongue/throat swelling, SOB or lightheadedness with hypotension: Yes Has patient had a PCN reaction causing severe rash involving mucus membranes or skin necrosis: No Has patient had a PCN reaction that required hospitalization No Has patient had a PCN reaction occurring within the last 10 years: Yes If all of the above answers are "NO", then may proceed with Cephalosporin use.    Sulfa Antibiotics Rash    Family History  Problem Relation Age of Onset   Breast cancer Neg Hx     Prior to Admission medications   Medication Sig Start Date End Date Taking? Authorizing Provider  ALPRAZolam (XANAX) 0.25 MG tablet Take 0.25-0.5 mg by mouth at bedtime as needed for anxiety.    [provider]  amLODipine (NORVASC) 5 MG tablet TAKE 1 TABLET BY MOUTH TWO  TIMES DAILY 09/25/17   [provider]  bisacodyl (DULCOLAX) 5 MG EC tablet Take 5 mg by mouth daily as needed for moderate constipation.    [provider]  COVID-19 mRNA bivalent vaccine, Pfizer, (PFIZER COVID-19 VAC BIVALENT) injection Inject into the muscle. 10/12/21   Judyann Munson, MD  cyclobenzaprine (FLEXERIL) 5 MG tablet Take 5 mg by mouth at bedtime.    [provider]  docusate sodium (COLACE) 100 MG capsule Take 100 mg by mouth daily.    [provider]  DULoxetine (CYMBALTA) 60 MG capsule Take 1 capsule by mouth daily. 05/27/16   [provider]  fluticasone (FLONASE) 50 MCG/ACT nasal spray Place 1 spray into both nostrils daily as needed. 06/17/16   [provider]  gabapentin (NEURONTIN) 600 MG tablet Take 1 tablet by mouth 3 (three) times daily. 05/27/16   [provider]  levothyroxine (SYNTHROID, LEVOTHROID) 50 MCG tablet Take 1 tablet by mouth daily. 05/27/16   [provider]  loratadine (CLARITIN) 10 MG tablet Take 10 mg  by mouth daily.    [provider]  meloxicam (MOBIC) 7.5 MG tablet Take 1 tablet by mouth 2 (two) times daily. 05/27/16   [provider]  metoCLOPramide (REGLAN) 10 MG tablet Take 5 mg by mouth daily. 05/27/16   [provider]  metoprolol (LOPRESSOR) 50 MG tablet Take 1 tablet by mouth 2 (two) times daily. 05/27/16   [provider]  mirtazapine (REMERON) 15 MG tablet Take by mouth. 08/22/17 08/22/18  [provider]  omeprazole (PRILOSEC) 20 MG capsule Take 20 mg by mouth 2 (two) times daily.     [provider]  Polyethyl Glycol-Propyl Glycol 0.4-0.3 % SOLN Apply to eye 2 (two) times daily.    [provider]  Probiotic Product (PHILLIPS COLON HEALTH PO) Take 1 capsule by mouth daily.    [provider]  traZODone (DESYREL) 50 MG  tablet Take 50 mg by mouth at bedtime.  04/19/16   [provider]  warfarin (COUMADIN) 2.5 MG tablet Take 1 tablet (2.5 mg total) by mouth daily at 6 PM. 03/07/17   Osvaldo Shipper, MD    Physical Exam: Vitals:   03/26/23 1530 03/26/23 1545 03/26/23 1600 03/26/23 1615  BP: (!) 190/87  (!) 185/103   Pulse:    (!) 50  Resp: 17 12 (!) 21 (!) 24  Temp:      TempSrc:      SpO2:    98%  Weight:      Height:       Physical Exam Vitals and nursing note reviewed.  Constitutional:      General: She is not in acute distress.    Appearance: She is normal weight. She is not toxic-appearing.  HENT:     Head: Normocephalic and atraumatic.     Mouth/Throat:     Pharynx: Oropharynx is clear.     Comments: Dried blood present on the outside of the lips with no evidence of tongue biting, lip bites or trauma. Eyes:     Conjunctiva/sclera: Conjunctivae normal.     Pupils: Pupils are equal, round, and reactive to light.  Cardiovascular:     Rate and Rhythm: Normal rate and regular rhythm.     Heart sounds: No murmur heard.    No gallop.  Pulmonary:     Effort: Pulmonary effort is normal. No  respiratory distress.     Breath sounds: No wheezing, rhonchi or rales.  Abdominal:     General: Bowel sounds are normal. There is distension (Mildly distended).     Palpations: Abdomen is soft.     Tenderness: There is abdominal tenderness (No focal tenderness, mild discomfort throughout). There is no guarding.  Musculoskeletal:     Right lower leg: No edema.     Left lower leg: No edema.  Skin:    General: Skin is warm and dry.     Coloration: Skin is pale.  Neurological:     Mental Status: She is alert.     Comments:  Patient is alert but only oriented to self.  Mostly keeping her eyes closed, with moderate difficulty following commands.  Moving all extremities independently and equally.  No facial asymmetry  Psychiatric:        Cognition and Memory: Cognition is impaired. Memory is impaired.    Data Reviewed: CBC with WBC of 10.7, hemoglobin 9.7, MCV 100.7 and platelets of 171 CMP with sodium of 134, potassium 4.3, bicarb 20, glucose 118, BUN 43, creatinine 1.88, albumin 3.0, AST 58, ALT 28, total protein 5.8, total bilirubin 1.4 and GFR 25 Troponin elevated at 27 with downtrend to 22 INR elevated at 2.0 COVID-19, RSV and influenza PCR negative  EKG personally reviewed.  Sinus rhythm with rate of 89.  No ST or T wave changes concerning for acute ischemia.  CT Head Wo Contrast  Result Date: 03/26/2023 CLINICAL DATA:  Mental status change, unknown cause EXAM: CT HEAD WITHOUT CONTRAST TECHNIQUE: Contiguous axial images were obtained from the base of the skull through the vertex without intravenous contrast. RADIATION DOSE REDUCTION: This exam was performed according to the departmental dose-optimization program which includes automated exposure control, adjustment of the mA and/or kV according to patient size and/or use of iterative reconstruction technique. COMPARISON:  CT head 11/29/2022. FINDINGS: Brain: No evidence of acute infarction, hemorrhage, hydrocephalus, extra-axial  collection or mass lesion/mass effect. Remote right PCA territory infarct.  Vascular: Calcific atherosclerosis. No hyperdense vessel identified. Skull: No acute fracture. Sinuses/Orbits: Right maxillary sinus mucosal thickening. No acute orbital findings. Other: No mastoid effusions. IMPRESSION: 1. No evidence of acute abnormality. 2. Remote right PCA territory infarct. Electronically Signed   By: Feliberto Harts M.D.   On: 03/26/2023 13:43   DG Chest Portable 1 View  Result Date: 03/26/2023 CLINICAL DATA:  Weakness, cough. EXAM: PORTABLE CHEST 1 VIEW COMPARISON:  Chest x-ray dated 11/29/2022 FINDINGS: Heart size and mediastinal contours are grossly stable. Lungs are clear. No pleural effusion or pneumothorax is seen. Osseous structures about the chest are unremarkable. IMPRESSION: No active disease. No evidence of pneumonia or pulmonary edema. Electronically Signed   By: Bary Richard M.D.   On: 03/26/2023 12:57    There are no new results to review at this time.  Assessment and Plan:  * Acute encephalopathy Patient presenting with 1 week history of increasing confusion, refusal to eat/drink, and increasing generalized weakness.  Previous history of Alzheimer's dementia with significant worsening in symptoms since her husband's passing approximately 6 weeks ago.  Most consistent with delirium.  She does have some dried blood on her lips today with patient's family thinking it may be from lip biting.  No prior history of seizure disorder or risk factors at this time.  AKI likely contributing as well.  - Telemetry monitoring with neuro checks q4h for 24 hours - Delirium precautions - N.p.o. pending swallow study - PT/OT - Management of AKI as noted below  AKI (acute kidney injury) In the setting of poor p.o. intake over the last 1 week.  Bladder scan with evidence of urinary retention.  - Renal ultrasound pending - Strict in and out every 6 hours with intermittent in/out catheterization as  needed - Avoid Foley catheter due to dementia and delirium - Urinalysis - Avoid nephrotoxic agents - Strict in and out  Macrocytic anemia Patient has a history of chronic anemia, previously 11.8 approximately 1 year ago, currently 9.7.    - Daily CBC - Transfuse for hemoglobin less than 7 - Iron panel pending - Vitamin B12 pending  Bright red blood per rectum Rectal exam with bright red blood, however history of active hemorrhoids. Both external and internal hemorrhoids noted on examination.  Differential also includes diverticulosis.  Patient is on Eliquis for history of PE.  If continued episodes of BRBPR, will likely need to hold Eliquis.  - Sitz bath per shift - Preparation H twice daily for 1 week - If bright red blood per rectum continues, consider GI consultation  Leukocytosis Unclear etiology at this time for minimal leukocytosis at 10.7.  No localizing symptoms to suggest infection, however urinalysis still pending.  - Urinalysis pending - Repeat CBC in the a.m.  Alzheimer's type dementia with late onset without behavioral disturbance - Continue bedtime Seroquel - Delirium precautions  Hypertension Patient appears significantly hypertensive, however she is flexing and agitated with blood pressure cuff is going off, so would suggest false positive.  - Continue home antihypertensives  Hypothyroid - TSH pending - Continue home Synthroid  Type 2 diabetes mellitus without complication, without long-term current use of insulin Not currently on any antiglycemic agents  - A1c pending - SSI, sensitive  Advance Care Planning:   Code Status: Full Code.  Patient's daughter at bedside stated she cannot make a decision regarding patient's CODE STATUS.  Patient's husband has recently passed and she states that he was her decision-maker.  No other siblings at bedside to assist  with making this decision.  To remain as full code at this time.  Consults: None  Family  Communication: Patient's daughter Jacqueline Tate updated at bedside  Severity of Illness: The appropriate patient status for this patient is OBSERVATION. Observation status is judged to be reasonable and necessary in order to provide the required intensity of service to ensure the patient's safety. The patient's presenting symptoms, physical exam findings, and initial radiographic and laboratory data in the context of their medical condition is felt to place them at decreased risk for further clinical deterioration. Furthermore, it is anticipated that the patient will be medically stable for discharge from the hospital within 2 midnights of admission.   Author: Verdene Lennert, MD 03/26/2023 4:59 PM  For on call review [email or web address].

## 2023-03-26 NOTE — ED Notes (Signed)
Bladder scan revealed >470 mL in bladder

## 2023-03-26 NOTE — Assessment & Plan Note (Signed)
Continue Seroquel at night

## 2023-03-26 NOTE — Progress Notes (Signed)
  Progress Note  Notified of patient developing fever. In/out catheter with over 1 L of output. Urinalysis is still pending, however primary suspicion for complicated UTI. Discussed with pharmacy; patient has allergy to cephalosporin and it has never been given previously per chart review. Will resort to Levofloxacin with renal dosing  - Levofloxacin per pharmacy dosing ordered - Continue q6h bladder scans with PRN in/out catheterization. Avoid foley as able due to significant delirium  Author: Verdene Lennert, MD 03/26/2023 6:47 PM  For on call review [email or web address].

## 2023-03-26 NOTE — Assessment & Plan Note (Addendum)
Not currently on any antiglycemic agents.  A1c of 6.3% in February 2024  - SSI, sensitive

## 2023-03-26 NOTE — Assessment & Plan Note (Signed)
In the setting of poor p.o. intake over the last 1 week.  Bladder scan with evidence of urinary retention.  - Renal ultrasound pending - Strict in and out every 6 hours with intermittent in/out catheterization as needed - Avoid Foley catheter due to dementia and delirium - Urinalysis - Avoid nephrotoxic agents - Strict in and out

## 2023-03-26 NOTE — Assessment & Plan Note (Addendum)
Patient presenting with 1 week history of increasing confusion, refusal to eat/drink, and increasing generalized weakness.  Previous history of Alzheimer's dementia with significant worsening in symptoms since her husband's passing approximately 6 weeks ago.  Most consistent with delirium.  She does have some dried blood on her lips today with patient's family thinking it may be from lip biting.  No prior history of seizure disorder or risk factors at this time.  AKI likely contributing as well.  - Telemetry monitoring with neuro checks q4h for 24 hours - Delirium precautions - N.p.o. pending swallow study - PT/OT - Management of AKI as noted below

## 2023-03-26 NOTE — Assessment & Plan Note (Addendum)
Patient has a history of chronic anemia, previously 11.8 approximately 1 year ago, currently 9.7.    - Daily CBC - Transfuse for hemoglobin less than 7 - Iron panel pending - Vitamin B12 pending

## 2023-03-26 NOTE — Assessment & Plan Note (Signed)
Unclear etiology at this time for minimal leukocytosis at 10.7.  No localizing symptoms to suggest infection, however urinalysis still pending.  - Urinalysis pending - Repeat CBC in the a.m.

## 2023-03-26 NOTE — ED Notes (Signed)
Advised nurse that patient has ready bed 

## 2023-03-26 NOTE — Assessment & Plan Note (Signed)
Continue Synthroid °

## 2023-03-26 NOTE — ED Provider Notes (Signed)
[facility] Provider Note    Event Date/Time   First MD Initiated Contact with Patient 03/26/23 1219     (approximate)   History   Chief Complaint: Weakness   HPI  LORRE OPDAHL is a 89 y.o. female with a past history of hypertension and thyroid disease who was brought to the ED due to generalized weakness.  Normally she is ambulatory at home, uses a cane sometimes as needed.  Over the last 2 or 3 days she has had gradually worsening generalized weakness.  This morning the patient was not able to support her own weight even with assistance from her husband and a walker.  She had hemorrhoidal bleeding yesterday, and they suspect that she had bleeding from her lips overnight as well due to blood found on her pillow.  Patient denies any pain or trauma.  She was recently started on trazodone for sleep.  She is on Eliquis as well.     Physical Exam   Triage Vital Signs: ED Triage Vitals  Enc Vitals Group     BP 03/26/23 1211 125/68     Pulse Rate 03/26/23 1211 86     Resp 03/26/23 1211 16     Temp 03/26/23 1211 98.4 F (36.9 C)     Temp Source 03/26/23 1211 Oral     SpO2 03/26/23 1211 98 %     Weight 03/26/23 1213 150 lb 14.4 oz (68.4 kg)     Height 03/26/23 1213  (1.676 m)     Head Circumference --      Peak Flow --      Pain Score 03/26/23 1210 5     Pain Loc --      Pain Edu? --      Excl. in GC? --     Most recent vital signs: Vitals:   03/26/23 1211  BP: 125/68  Pulse: 86  Resp: 16  Temp: 98.4 F (36.9 C)  SpO2: 98%    General: Awake, no distress.  CV:  Good peripheral perfusion.  Regular rate rhythm Resp:  Normal effort.  Clear to auscultation bilaterally Abd:  No distention.  Soft nontender Other:  Dry mucous membranes   ED Results / Procedures / Treatments   Labs (all labs ordered are listed, but only abnormal results are displayed) Labs Reviewed  COMPREHENSIVE METABOLIC PANEL - Abnormal; Notable for the following  components:      Result Value   Sodium 134 (*)    CO2 20 (*)    Glucose, Bld 118 (*)    BUN 43 (*)    Creatinine, Ser 1.88 (*)    Calcium 8.2 (*)    Total Protein 5.8 (*)    Albumin 3.0 (*)    AST 58 (*)    Total Bilirubin 1.4 (*)    GFR, Estimated 25 (*)    All other components within normal limits  CBC WITH DIFFERENTIAL/PLATELET - Abnormal; Notable for the following components:   WBC 10.7 (*)    RBC 2.97 (*)    Hemoglobin 9.7 (*)    HCT 29.9 (*)    MCV 100.7 (*)    RDW 17.3 (*)    Neutro Abs 8.9 (*)    Abs Immature Granulocytes 0.12 (*)    All other components within normal limits  PROTIME-INR - Abnormal; Notable for the following components:   Prothrombin Time 22.7 (*)    INR 2.0 (*)    All other components within normal  limits  BLOOD GAS, VENOUS - Abnormal; Notable for the following components:   Bicarbonate 28.5 (*)    Acid-Base Excess 2.8 (*)    All other components within normal limits  TROPONIN I (HIGH SENSITIVITY) - Abnormal; Notable for the following components:   Troponin I (High Sensitivity) 27 (*)    All other components within normal limits  RESP PANEL BY RT-PCR (RSV, FLU A&B, COVID)  RVPGX2  LIPASE, BLOOD  URINALYSIS, W/ REFLEX TO CULTURE (INFECTION SUSPECTED)  SAMPLE TO BLOOD BANK  TROPONIN I (HIGH SENSITIVITY)     EKG Interpreted by me Sinus rhythm rate of 89.  Normal axis intervals QRS ST segments and T waves   RADIOLOGY Chest x-ray interpreted by me, appears unremarkable.  Radiology report reviewed   PROCEDURES:  Procedures   MEDICATIONS ORDERED IN ED: Medications  sodium chloride 0.9 % bolus 1,000 mL (0 mLs Intravenous Stopped 03/26/23 1445)     IMPRESSION / MDM / ASSESSMENT AND PLAN / ED COURSE  I reviewed the triage vital signs and the nursing notes.  DDx: Trazodone side effect, intracranial hemorrhage, intracranial mass, dehydration, AKI, electrolyte abnormality, anemia, UTI, COVID  Patient's presentation is most consistent  with acute presentation with potential threat to life or bodily function.  Patient presents with generalized weakness, no focal pain complaints or evidence of trauma.  Will check labs, CT head chest x-ray.  ----------------------------------------- 2:49 PM on 03/26/2023 ----------------------------------------- Imaging okay.  Labs show AKI with high BUN/Crea ratio, likely dehydration.  Still awaiting urinalysis.  With her symptoms, will need to admit for further management.       FINAL CLINICAL IMPRESSION(S) / ED DIAGNOSES   Final diagnoses:  Generalized weakness  AKI (acute kidney injury)     Rx / DC Orders   ED Discharge Orders     None        Note:  This document was prepared using Dragon voice recognition software and may include unintentional dictation errors.   Sharman Cheek, MD 03/26/23 [phone]

## 2023-03-26 NOTE — Assessment & Plan Note (Signed)
Rectal exam with bright red blood, however history of active hemorrhoids. Both external and internal hemorrhoids noted on examination.  Differential also includes diverticulosis.  Patient is on Eliquis for history of PE.  If continued episodes of BRBPR, will likely need to hold Eliquis.  - Sitz bath per shift - Preparation H twice daily for 1 week - If bright red blood per rectum continues, consider GI consultation

## 2023-03-26 NOTE — ED Triage Notes (Signed)
Pt comes in from home via [facility] with generalized weakness. According to EMS,the pt was very lethargic this morning and fell. The pt's husband was on scene and stated that between the pt's walker and his assistance, he was unable to keep her on her feet. The pt's family states that she had a substantial amount of bleeding yesterday due to her hemorrhoids. The family also noticed that she had bleeding on her pillow this morning. Pt has what appears to be dried blood on her lips on arrival. Pt is currently on eliquis, and gabapentin. The patient was recently prescribed 50mg  of trazodone.   Pt is alert to person and place. Pt also has a history of dementia. Pt has a 18 gauge in left antecubital, and received 250cc in normal saline.  EMS Vitals: 105/64 142 Blood sugar 91% RA

## 2023-03-26 NOTE — ED Notes (Signed)
Pt complaining of abdominal pain. MD Paduchowski notified

## 2023-03-26 NOTE — Progress Notes (Signed)
Pharmacy Antibiotic Note  Jacqueline Tate is a 89 y.o. female admitted on 03/26/2023 with UTI.  Pharmacy has been consulted for levofloxacin dosing.  Allergy to penicillin- no history of cephalosporin.  Plan: Levofloxacin IV 500 mg every 48 hours  Height: 5\' 6"  (167.6 cm) Weight: 68.4 kg (150 lb 14.4 oz) IBW/kg (Calculated) : 59.3  Temp (24hrs), Avg:99.8 F (37.7 C), Min:98.4 F (36.9 C), Max:101.1 F (38.4 C)  Recent Labs  Lab 03/26/23 1208  WBC 10.7*  CREATININE 1.88*    Estimated Creatinine Clearance: 19 mL/min (A) (by C-G formula based on SCr of 1.88 mg/dL (H)).    Allergies  Allergen Reactions   Codeine Nausea And Vomiting   Latex    Simvastatin Other (See Comments)    Other reaction(s): Muscle Pain Weakness to leg muscles & weakness Weakness to leg muscles    Penicillins Rash    Has patient had a PCN reaction causing immediate rash, facial/tongue/throat swelling, SOB or lightheadedness with hypotension: Yes Has patient had a PCN reaction causing severe rash involving mucus membranes or skin necrosis: No Has patient had a PCN reaction that required hospitalization No Has patient had a PCN reaction occurring within the last 10 years: Yes If all of the above answers are "NO", then may proceed with Cephalosporin use.    Sulfa Antibiotics Rash    Antimicrobials this admission: levofloxacin 4/14 >>    Dose adjustments this admission: Q48hr for Crcl < 29mL/min  Microbiology results: 4/14 BCx: to be collected 4/14 UCx: in process    Thank you for allowing pharmacy to be a part of this patient's care.  Jaynie Bream 03/26/2023 6:56 PM

## 2023-03-26 NOTE — Assessment & Plan Note (Addendum)
Continue metoprolol.  Added Norvasc.

## 2023-03-27 ENCOUNTER — Other Ambulatory Visit: Payer: Self-pay

## 2023-03-27 DIAGNOSIS — R531 Weakness: Secondary | ICD-10-CM | POA: Diagnosis not present

## 2023-03-27 DIAGNOSIS — I129 Hypertensive chronic kidney disease with stage 1 through stage 4 chronic kidney disease, or unspecified chronic kidney disease: Secondary | ICD-10-CM | POA: Diagnosis present

## 2023-03-27 DIAGNOSIS — Z923 Personal history of irradiation: Secondary | ICD-10-CM | POA: Diagnosis not present

## 2023-03-27 DIAGNOSIS — E872 Acidosis, unspecified: Secondary | ICD-10-CM | POA: Diagnosis present

## 2023-03-27 DIAGNOSIS — Z7901 Long term (current) use of anticoagulants: Secondary | ICD-10-CM | POA: Diagnosis not present

## 2023-03-27 DIAGNOSIS — D6959 Other secondary thrombocytopenia: Secondary | ICD-10-CM | POA: Diagnosis present

## 2023-03-27 DIAGNOSIS — E876 Hypokalemia: Secondary | ICD-10-CM | POA: Diagnosis present

## 2023-03-27 DIAGNOSIS — E871 Hypo-osmolality and hyponatremia: Secondary | ICD-10-CM | POA: Diagnosis present

## 2023-03-27 DIAGNOSIS — Z515 Encounter for palliative care: Secondary | ICD-10-CM | POA: Diagnosis not present

## 2023-03-27 DIAGNOSIS — R627 Adult failure to thrive: Secondary | ICD-10-CM | POA: Diagnosis present

## 2023-03-27 DIAGNOSIS — N179 Acute kidney failure, unspecified: Secondary | ICD-10-CM | POA: Diagnosis present

## 2023-03-27 DIAGNOSIS — G9340 Encephalopathy, unspecified: Secondary | ICD-10-CM | POA: Diagnosis present

## 2023-03-27 DIAGNOSIS — G301 Alzheimer's disease with late onset: Secondary | ICD-10-CM | POA: Diagnosis present

## 2023-03-27 DIAGNOSIS — Z66 Do not resuscitate: Secondary | ICD-10-CM | POA: Diagnosis present

## 2023-03-27 DIAGNOSIS — R6520 Severe sepsis without septic shock: Secondary | ICD-10-CM | POA: Diagnosis present

## 2023-03-27 DIAGNOSIS — A419 Sepsis, unspecified organism: Secondary | ICD-10-CM | POA: Diagnosis not present

## 2023-03-27 DIAGNOSIS — D539 Nutritional anemia, unspecified: Secondary | ICD-10-CM | POA: Diagnosis not present

## 2023-03-27 DIAGNOSIS — D631 Anemia in chronic kidney disease: Secondary | ICD-10-CM | POA: Diagnosis present

## 2023-03-27 DIAGNOSIS — G9341 Metabolic encephalopathy: Secondary | ICD-10-CM | POA: Diagnosis present

## 2023-03-27 DIAGNOSIS — K625 Hemorrhage of anus and rectum: Secondary | ICD-10-CM | POA: Diagnosis present

## 2023-03-27 DIAGNOSIS — G309 Alzheimer's disease, unspecified: Secondary | ICD-10-CM | POA: Diagnosis not present

## 2023-03-27 DIAGNOSIS — E1122 Type 2 diabetes mellitus with diabetic chronic kidney disease: Secondary | ICD-10-CM | POA: Diagnosis present

## 2023-03-27 DIAGNOSIS — D696 Thrombocytopenia, unspecified: Secondary | ICD-10-CM

## 2023-03-27 DIAGNOSIS — N1831 Chronic kidney disease, stage 3a: Secondary | ICD-10-CM | POA: Diagnosis present

## 2023-03-27 DIAGNOSIS — N390 Urinary tract infection, site not specified: Secondary | ICD-10-CM | POA: Diagnosis present

## 2023-03-27 DIAGNOSIS — Z1152 Encounter for screening for COVID-19: Secondary | ICD-10-CM | POA: Diagnosis not present

## 2023-03-27 DIAGNOSIS — Z79890 Hormone replacement therapy (postmenopausal): Secondary | ICD-10-CM | POA: Diagnosis not present

## 2023-03-27 DIAGNOSIS — A4151 Sepsis due to Escherichia coli [E. coli]: Secondary | ICD-10-CM | POA: Diagnosis present

## 2023-03-27 DIAGNOSIS — F02818 Dementia in other diseases classified elsewhere, unspecified severity, with other behavioral disturbance: Secondary | ICD-10-CM | POA: Diagnosis present

## 2023-03-27 DIAGNOSIS — E039 Hypothyroidism, unspecified: Secondary | ICD-10-CM | POA: Diagnosis present

## 2023-03-27 LAB — BASIC METABOLIC PANEL
Anion gap: 7 (ref 5–15)
BUN: 30 mg/dL — ABNORMAL HIGH (ref 8–23)
CO2: 25 mmol/L (ref 22–32)
Calcium: 8 mg/dL — ABNORMAL LOW (ref 8.9–10.3)
Chloride: 104 mmol/L (ref 98–111)
Creatinine, Ser: 1.11 mg/dL — ABNORMAL HIGH (ref 0.44–1.00)
GFR, Estimated: 48 mL/min — ABNORMAL LOW (ref >60)
Glucose, Bld: 104 mg/dL — ABNORMAL HIGH (ref 70–99)
Potassium: 3.2 mmol/L — ABNORMAL LOW (ref 3.5–5.1)
Sodium: 136 mmol/L (ref 135–145)

## 2023-03-27 LAB — BLOOD CULTURE ID PANEL (REFLEXED) - BCID2

## 2023-03-27 LAB — CBC
HCT: 25.5 % — ABNORMAL LOW (ref 36.0–46.0)
Hemoglobin: 8.5 g/dL — ABNORMAL LOW (ref 12.0–15.0)
MCH: 32.8 pg (ref 26.0–34.0)
MCHC: 33.3 g/dL (ref 30.0–36.0)
MCV: 98.5 fL (ref 80.0–100.0)
Platelets: 119 10*3/uL — ABNORMAL LOW (ref 150–400)
RBC: 2.59 MIL/uL — ABNORMAL LOW (ref 3.87–5.11)
RDW: 17.2 % — ABNORMAL HIGH (ref 11.5–15.5)
WBC: 6.5 10*3/uL (ref 4.0–10.5)
nRBC: 0 % (ref 0.0–0.2)

## 2023-03-27 LAB — GLUCOSE, CAPILLARY
Glucose-Capillary: 96 mg/dL (ref 70–99)
Glucose-Capillary: 119 mg/dL — ABNORMAL HIGH (ref 70–99)
Glucose-Capillary: 109 mg/dL — ABNORMAL HIGH (ref 70–99)
Glucose-Capillary: 110 mg/dL — ABNORMAL HIGH (ref 70–99)

## 2023-03-27 LAB — IRON AND TIBC
Iron: 16 ug/dL — ABNORMAL LOW (ref 28–170)
Saturation Ratios: 8 % — ABNORMAL LOW (ref 10.4–31.8)
TIBC: 211 ug/dL — ABNORMAL LOW (ref 250–450)
UIBC: 195 ug/dL

## 2023-03-27 LAB — CULTURE, BLOOD (ROUTINE X 2)

## 2023-03-27 LAB — PROTIME-INR
INR: 1.8 — ABNORMAL HIGH (ref 0.8–1.2)
Prothrombin Time: 20.9 seconds — ABNORMAL HIGH (ref 11.4–15.2)

## 2023-03-27 LAB — VITAMIN B12: Vitamin B-12: 657 pg/mL (ref 180–914)

## 2023-03-27 LAB — FERRITIN: Ferritin: 156 ng/mL (ref 11–307)

## 2023-03-27 MED ORDER — EPINEPHRINE 0.3 MG/0.3ML IJ SOAJ
0.3000 mg | Freq: Once | INTRAMUSCULAR | Status: DC | PRN
  Filled 2023-03-27: qty 0.3

## 2023-03-27 MED ORDER — LEVOFLOXACIN IN D5W 250 MG/50ML IV SOLN
250.0000 mg | INTRAVENOUS | Status: DC
  Filled 2023-03-27: qty 50

## 2023-03-27 MED ORDER — DEXTROSE 5 % IV SOLN
500.0000 mg | Freq: Once | INTRAVENOUS | Status: AC
  Administered 2023-03-27: 500 mg via INTRAVENOUS
  Filled 2023-03-27: qty 500

## 2023-03-27 MED ORDER — SODIUM CHLORIDE 0.9 % IV SOLN
1.0000 g | Freq: Once | INTRAVENOUS | Status: AC
  Administered 2023-03-27: 1 g via INTRAVENOUS
  Filled 2023-03-27: qty 10

## 2023-03-27 MED ORDER — HYDRALAZINE HCL 20 MG/ML IJ SOLN
5.0000 mg | Freq: Once | INTRAMUSCULAR | Status: DC | PRN
  Filled 2023-03-27: qty 1

## 2023-03-27 MED ORDER — LIDOCAINE 5 % EX PTCH
1.0000 | MEDICATED_PATCH | CUTANEOUS | Status: DC
  Administered 2023-03-28 – 2023-04-02 (×7): 1 via TRANSDERMAL
  Filled 2023-03-27 (×7): qty 1

## 2023-03-27 MED ORDER — METHYLPREDNISOLONE SODIUM SUCC 125 MG IJ SOLR: 125.0000 mg | Freq: Once | INTRAMUSCULAR | Status: DC | PRN

## 2023-03-27 MED ORDER — AMLODIPINE BESYLATE 5 MG PO TABS
5.0000 mg | ORAL_TABLET | Freq: Every day | ORAL | Status: DC
  Administered 2023-03-27 – 2023-04-03 (×8): 5 mg via ORAL
  Filled 2023-03-27 (×9): qty 1

## 2023-03-27 MED ORDER — POTASSIUM CHLORIDE 10 MEQ/100ML IV SOLN
10.0000 meq | INTRAVENOUS | Status: AC
  Administered 2023-03-27 (×2): 10 meq via INTRAVENOUS
  Filled 2023-03-27 (×2): qty 100

## 2023-03-27 MED ORDER — SODIUM CHLORIDE 0.9 % IV SOLN
2.0000 g | INTRAVENOUS | Status: DC
  Administered 2023-03-28 – 2023-03-29 (×2): 2 g via INTRAVENOUS
  Filled 2023-03-27 (×2): qty 20

## 2023-03-27 MED ORDER — DIPHENHYDRAMINE HCL 50 MG/ML IJ SOLN
25.0000 mg | Freq: Once | INTRAMUSCULAR | Status: AC
  Administered 2023-03-27: 25 mg via INTRAVENOUS

## 2023-03-27 MED ORDER — DIPHENHYDRAMINE HCL 50 MG/ML IJ SOLN
25.0000 mg | Freq: Once | INTRAMUSCULAR | Status: DC | PRN
  Filled 2023-03-27: qty 1

## 2023-03-27 MED ORDER — FAMOTIDINE IN NACL 20-0.9 MG/50ML-% IV SOLN: 20.0000 mg | Freq: Once | INTRAVENOUS | Status: DC | PRN

## 2023-03-27 NOTE — Assessment & Plan Note (Signed)
Secondary to sepsis.  Today's platelet count 137.

## 2023-03-27 NOTE — Assessment & Plan Note (Signed)
Replaced 

## 2023-03-27 NOTE — Progress Notes (Signed)
Pharmacy Antibiotic Note  Jacqueline Tate is a 89 y.o. female w/ PMH of dementia, HTN, hypothyroidism, DM, CKD, h/o DVT/PE admitted on 03/26/2023 with UTI.  Pharmacy has been consulted for levofloxacin dosing.  Allergy to penicillin- no history of cephalosporin exposure discoverable  Plan: adjust levofloxacin IV to 250 mg every 24 hours ---follow renal function for needed dose adjustment  Height: 5\' 6"  (167.6 cm) Weight: 68 kg (149 lb 14.6 oz) IBW/kg (Calculated) : 59.3  Temp (24hrs), Avg:99.1 F (37.3 C), Min:98.2 F (36.8 C), Max:101.1 F (38.4 C)  Recent Labs  Lab 03/26/23 1208 03/26/23 1845 03/27/23 0417  WBC 10.7*  --  6.5  CREATININE 1.88* 1.35* 1.11*  LATICACIDVEN  --  1.4  --      Estimated Creatinine Clearance: 32.2 mL/min (A) (by C-G formula based on SCr of 1.11 mg/dL (H)).    Allergies  Allergen Reactions   Codeine Nausea And Vomiting   Latex    Simvastatin Other (See Comments)    Other reaction(s): Muscle Pain Weakness to leg muscles & weakness Weakness to leg muscles    Penicillins Rash    Has patient had a PCN reaction causing immediate rash, facial/tongue/throat swelling, SOB or lightheadedness with hypotension: Yes Has patient had a PCN reaction causing severe rash involving mucus membranes or skin necrosis: No Has patient had a PCN reaction that required hospitalization No Has patient had a PCN reaction occurring within the last 10 years: Yes If all of the above answers are "NO", then may proceed with Cephalosporin use.    Sulfa Antibiotics Rash    Antimicrobials this admission: levofloxacin 4/14 >>   Microbiology results: 4/14 BCx: E coli 4/14 UCx: in process    Thank you for allowing pharmacy to be a part of this patient's care.  [name] 03/27/2023 7:54 AM

## 2023-03-27 NOTE — Evaluation (Signed)
Physical Therapy Evaluation Patient Details Name: Jacqueline Tate MRN: [id number] DOB: 1931/07/30 Today's Date: 03/27/2023  History of Present Illness  89 y/o female presented to ED on 03/26/23 for generalized weakness, lethargy, bleeding from hemorrhoids and on lips, and fall. Admitted for sepsis 2/2 UTI. 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  Clinical Impression  Patient admitted with the above. Unsure of home setup and accurate PLOF 2/2 cognitive impairment as a result of dementia. Per patient, she utilizes a cane at home for mobility. Patient presents with weakness, impaired balance, and decreased activity tolerance. Baseline cognitive deficits, however patient follows 1-step commands consistently. Able to complete bed mobility and transfers with minA. Ambulated short in room distance with RW and minA for balance and RW management. Required seated break for urination but able to complete ambulation back to recliner with similar assist. +2 for safety initially due to line management and tight room navigation. Patient will benefit from skilled PT services during acute stay to address listed deficits.    Recommendations for follow up therapy are one component of a multi-disciplinary discharge planning process, led by the attending physician.  Recommendations may be updated based on patient status, additional functional criteria and insurance authorization.  Follow Up Recommendations       Assistance Recommended at Discharge Frequent or constant Supervision/Assistance  Patient can return home with the following  A lot of help with walking and/or transfers;A little help with bathing/dressing/bathroom;Assistance with cooking/housework;Direct supervision/assist for medications management;Direct supervision/assist for financial management;Assist for transportation;Help with stairs or ramp for entrance    Equipment Recommendations Rolling Alexsis Branscom (2 wheels)   Recommendations for Other Services       Functional Status Assessment Patient has had a recent decline in their functional status and demonstrates the ability to make significant improvements in function in a reasonable and predictable amount of time.     Precautions / Restrictions Precautions Precautions: Fall Precaution Comments: hx of dementia Restrictions Weight Bearing Restrictions: No      Mobility  Bed Mobility Overal bed mobility: Needs Assistance Bed Mobility: Supine to Sit     Supine to sit: Min assist     General bed mobility comments: assist for repositioning hips towards EOB. Cues for hand placement to utilize bed rail to assist with trunk elevation    Transfers Overall transfer level: Needs assistance Equipment used: Rolling Radin Raptis (2 wheels) Transfers: Sit to/from Stand Sit to Stand: Min assist           General transfer comment: assist to boost into standing. Cues for hand placement on bed to stand    Ambulation/Gait Ambulation/Gait assistance: Min assist, +2 safety/equipment Gait Distance (Feet): 8 Feet (+8') Assistive device: Rolling Lynnita Somma (2 wheels) Gait Pattern/deviations: Step-to pattern, Decreased stride length, Trunk flexed Gait velocity: decreased     General Gait Details: assist for RW management and balance. Cues to maintain close proximity to RW. Intermittent follow through. Impulsive with need to use BSC to urinate  Stairs            Wheelchair Mobility    Modified Rankin (Stroke Patients Only)       Balance Overall balance assessment: Needs assistance, History of Falls Sitting-balance support: Feet supported, No upper extremity supported Sitting balance-Leahy Scale: Good Sitting balance - Comments: able to don/doff socks while seated EOB without LOB   Standing balance support: Bilateral upper extremity supported, Reliant on assistive device for balance Standing balance-Leahy Scale: Poor Standing balance comment:  reliant  on UE support and external assist for balance                             Pertinent Vitals/Pain Pain Assessment Pain Assessment: Faces Faces Pain Scale: Hurts little more Pain Location: L hand 2/2 medication infusion Pain Descriptors / Indicators: Grimacing Pain Intervention(s): Monitored during session, Limited activity within patient's tolerance    Home Living Family/patient expects to be discharged to:: Unsure                   Additional Comments: unable to obtain home setup information 2/2 cognitive impairments. Patient stating she lives with her mother. Will continue to follow up in subsequent sessions    Prior Function Prior Level of Function : Patient poor historian/Family not available;History of Falls (last six months)             Mobility Comments: uses cane at baseline. Per chart, fall prior to admission. Unsure of any further falls 2/2 cognitive impairments       Hand Dominance        Extremity/Trunk Assessment   Upper Extremity Assessment Upper Extremity Assessment: Defer to OT evaluation    Lower Extremity Assessment Lower Extremity Assessment: Generalized weakness    Cervical / Trunk Assessment Cervical / Trunk Assessment: Kyphotic  Communication   Communication: No difficulties  Cognition Arousal/Alertness: Awake/alert (initially lethargic but with mobility, increasing alertness) Behavior During Therapy: WFL for tasks assessed/performed Overall Cognitive Status: History of cognitive impairments - at baseline                                 General Comments: hx of dementia. Oriented to self and place. Follows one step commands consistently        General Comments General comments (skin integrity, edema, etc.): VSS on RA    Exercises     Assessment/Plan    PT Assessment Patient needs continued PT services  PT Problem List Decreased strength;Decreased activity tolerance;Decreased balance;Decreased  mobility;Decreased cognition;Decreased knowledge of use of DME;Decreased safety awareness;Decreased knowledge of precautions       PT Treatment Interventions DME instruction;Gait training;Functional mobility training;Therapeutic activities;Therapeutic exercise;Balance training;Patient/family education    PT Goals (Current goals can be found in the Care Plan section)  Acute Rehab PT Goals Patient Stated Goal: did not state PT Goal Formulation: Patient unable to participate in goal setting Time For Goal Achievement: 04/10/23 Potential to Achieve Goals: Fair    Frequency Min 3X/week     Co-evaluation               AM-PAC PT "6 Clicks" Mobility  Outcome Measure Help needed turning from your back to your side while in a flat bed without using bedrails?: A Little Help needed moving from lying on your back to sitting on the side of a flat bed without using bedrails?: A Little Help needed moving to and from a bed to a chair (including a wheelchair)?: A Little Help needed standing up from a chair using your arms (e.g., wheelchair or bedside chair)?: A Little Help needed to walk in hospital room?: A Little Help needed climbing 3-5 steps with a railing? : Total 6 Click Score: 16    End of Session   Activity Tolerance: Patient tolerated treatment well Patient left: in chair;with call bell/phone within reach;with chair alarm set;Other (comment) (MD at bedside) Nurse Communication: Mobility status;Other (comment) (  urine collection and up in chair with chair alarm) PT Visit Diagnosis: Unsteadiness on feet (R26.81);Muscle weakness (generalized) (M62.81);History of falling (Z91.81);Difficulty in walking, not elsewhere classified (R26.2)    Time: 1610-9604 PT Time Calculation (min) (ACUTE ONLY): 25 min   Charges:   PT Evaluation $PT Eval Moderate Complexity: 1 Mod          Xavier Fournier A. Dan Humphreys PT, DPT [facility] - Acute Rehabilitation Services   Shaneice Barsanti A Ashna Dorough 03/27/2023, 9:52 AM

## 2023-03-27 NOTE — Assessment & Plan Note (Addendum)
Present on admission.  E. coli in blood cultures and urine cultures.  Patient has acute metabolic encephalopathy, thrombocytopenia, acute kidney injury, leukocytosis, tachycardia and fever of 101.1 at 1831 on 03/26/2023.  Patient initially placed on Levaquin.  Patient had a test dose of Rocephin and tolerated it.  Continue Rocephin.  Follow-up sensitivities on blood and urine cultures.

## 2023-03-27 NOTE — Evaluation (Signed)
Occupational Therapy Evaluation Patient Details Name: Jacqueline Tate MRN: [id number] DOB: [birth year] Today's Date: 03/27/2023   History of Present Illness 89 y/o female presented to ED on 03/26/23 for generalized weakness, lethargy, bleeding from hemorrhoids and on lips, and fall. Admitted for sepsis 2/2 UTI. 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   Clinical Impression   Patient agreeable to OT/PT co-treatment to maximize safety and participation. Pt presenting with decreased independence in self care, balance, functional mobility/transfers, endurance, and safety awareness. Unable to obtain PLOF due to pt's baseline cognitive deficits. She was oriented to self/location and followed single step commands well this date. Pt currently functioning at Min A for bed mobility, Min A for STS from EOB/BSC, and Min A +2 (for safety/equipment) to complete functional mobility to [facility] using RW. She completed seated LB dressing/bathing with set up-supervision, clothing management with Min A, and Min guard for peri care in standing. Pt will benefit from skilled acute OT services to address deficits noted below. OT recommends ongoing therapy upon discharge to maximize safety and independence with ADLs, decrease fall risk, decrease caregiver burden, and promote return to PLOF.       Recommendations for follow up therapy are one component of a multi-disciplinary discharge planning process, led by the attending physician.  Recommendations may be updated based on patient status, additional functional criteria and insurance authorization.   Assistance Recommended at Discharge Frequent or constant Supervision/Assistance  Patient can return home with the following A lot of help with walking and/or transfers;A little help with bathing/dressing/bathroom;Assistance with cooking/housework;Assist for transportation;Help with stairs or ramp for entrance;Direct supervision/assist for financial  management;Direct supervision/assist for medications management    Functional Status Assessment  Patient has had a recent decline in their functional status and demonstrates the ability to make significant improvements in function in a reasonable and predictable amount of time.  Equipment Recommendations  None recommended by OT    Recommendations for Other Services       Precautions / Restrictions Precautions Precautions: Fall Precaution Comments: hx of dementia Restrictions Weight Bearing Restrictions: No      Mobility Bed Mobility Overal bed mobility: Needs Assistance Bed Mobility: Supine to Sit     Supine to sit: Min assist     General bed mobility comments: assist for repositioning hips towards EOB. Cues for hand placement to utilize bed rail to assist with trunk elevation    Transfers Overall transfer level: Needs assistance Equipment used: Rolling walker (2 wheels) Transfers: Sit to/from Stand Sit to Stand: Min assist           General transfer comment: STS from EOB, VC for hand placement      Balance Overall balance assessment: Needs assistance, History of Falls Sitting-balance support: Feet supported, No upper extremity supported Sitting balance-Leahy Scale: Good     Standing balance support: Bilateral upper extremity supported, Reliant on assistive device for balance Standing balance-Leahy Scale: Poor           ADL either performed or assessed with clinical judgement   ADL Overall ADL's : Needs assistance/impaired             Lower Body Bathing: Set up;Sitting/lateral leans;Supervison/ safety Lower Body Bathing Details (indicate cue type and reason): to clean urine off of BLEs Upper Body Dressing : Minimal assistance;Sitting   Lower Body Dressing: Set up;Supervision/safety;Sitting/lateral leans Lower Body Dressing Details (indicate cue type and reason): to don/doff socks Toilet Transfer: Ambulation;Minimal assistance;BSC/3in1;Rolling  walker (2 wheels);+2  for safety/equipment   Toileting- Clothing Manipulation and Hygiene: Min guard;Sit to/from stand;Minimal assistance Toileting - Clothing Manipulation Details (indicate cue type and reason): Min guard for safety during peri care in standing after continent void on [facility], assist for managing gown     Functional mobility during ADLs: Minimal assistance;+2 for safety/equipment;Rolling walker (2 wheels) (~32ft to [facility]) General ADL Comments: Pt with urinary urgency, attempting to complete functional mobility to the bathroom but pt starting to urinate on the floor and OT brought BSC beside pt for toileting.     Vision Patient Visual Report: No change from baseline       Perception     Praxis      Pertinent Vitals/Pain Pain Assessment Pain Assessment: Faces Faces Pain Scale: Hurts little more Pain Location: L hand 2/2 medication infusion Pain Descriptors / Indicators: Grimacing Pain Intervention(s): Limited activity within patient's tolerance, Monitored during session     Hand Dominance     Extremity/Trunk Assessment Upper Extremity Assessment Upper Extremity Assessment: Generalized weakness   Lower Extremity Assessment Lower Extremity Assessment: Generalized weakness   Cervical / Trunk Assessment Cervical / Trunk Assessment: Kyphotic   Communication Communication Communication: No difficulties   Cognition Arousal/Alertness: Awake/alert Behavior During Therapy: WFL for tasks assessed/performed Overall Cognitive Status: History of cognitive impairments - at baseline       General Comments: Hx of dementia. Initally lethargic, alertness improved with mobility. Oriented to self and place. Follows one step commands consistently. Pt reporting she lives with her mother, reported current year is 36.     General Comments  VSS on RA    Exercises Other Exercises Other Exercises: OT provided education re: role of OT, OT POC, post acute recs, sitting up for all  meals, EOB/OOB mobility with assistance, home/fall safety.     Shoulder Instructions      Home Living Family/patient expects to be discharged to:: Unsure       Additional Comments: unable to obtain home setup information 2/2 cognitive impairments. Patient stating she lives with her mother. Will continue to follow up in subsequent sessions      Prior Functioning/Environment Prior Level of Function : Patient poor historian/Family not available;History of Falls (last six months)             Mobility Comments: uses cane at baseline. Per chart, fall prior to admission. Unsure of any further falls 2/2 cognitive impairments ADLs Comments: unable to obtain info 2/2 cognitive impairments        OT Problem List: Pain;Decreased cognition;Decreased safety awareness;Decreased knowledge of use of DME or AE;Decreased knowledge of precautions;Decreased strength;Decreased activity tolerance;Impaired balance (sitting and/or standing)      OT Treatment/Interventions: Self-care/ADL training;Therapeutic exercise;Neuromuscular education;Energy conservation;DME and/or AE instruction;Manual therapy;Modalities;Balance training;Patient/family education;Visual/perceptual remediation/compensation;Cognitive remediation/compensation;Therapeutic activities;Splinting    OT Goals(Current goals can be found in the care plan section) Acute Rehab OT Goals Patient Stated Goal: none stated OT Goal Formulation: Patient unable to participate in goal setting Time For Goal Achievement: 04/10/23 Potential to Achieve Goals: Fair   OT Frequency: Min 2X/week    Co-evaluation PT/OT/SLP Co-Evaluation/Treatment: Yes Reason for Co-Treatment: For patient/therapist safety   OT goals addressed during session: ADL's and self-care;Proper use of Adaptive equipment and DME      AM-PAC OT "6 Clicks" Daily Activity     Outcome Measure Help from another person eating meals?: A Little Help from another person taking care of  personal grooming?: A Little Help from another person toileting, which includes using toliet, bedpan, or urinal?:  A Little Help from another person bathing (including washing, rinsing, drying)?: A Little Help from another person to put on and taking off regular upper body clothing?: A Little Help from another person to put on and taking off regular lower body clothing?: A Little 6 Click Score: 18   End of Session Equipment Utilized During Treatment: Gait belt;Rolling walker (2 wheels) Nurse Communication: Mobility status;Other (comment) (continent void on [facility])  Activity Tolerance: Patient tolerated treatment well Patient left: in chair;with call bell/phone within reach;with chair alarm set;Other (comment) (MD present at end of session)  OT Visit Diagnosis: Unsteadiness on feet (R26.81);Muscle weakness (generalized) (M62.81);History of falling (Z91.81);Pain                Time: 1610-9604 OT Time Calculation (min): 25 min Charges:  OT General Charges $OT Visit: 1 Visit OT Evaluation $OT Eval Moderate Complexity: 1 Mod  [facility] MS, OTR/L ascom [phone]  03/27/23, 12:19 PM

## 2023-03-27 NOTE — Progress Notes (Signed)
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  Patient coming in with altered mental status also had foul-smelling urine.  Patient not eating and drinking very well.  4/15.  Blood cultures positive for E. coli.  Urine culture pending.  Case discussed with ID pharmacist and we will try to give Rocephin instead of Levaquin.  Mental status good enough to be placed on a diet and work with physical therapy.  Assessment and Plan: * Severe sepsis Present on admission.  E. coli and blood cultures.  Patient has acute metabolic encephalopathy, thrombocytopenia, acute kidney injury, leukocytosis, tachycardia and fever of 101.1 at 1831 on 03/26/2023.  Patient initially placed on Levaquin.  Case discussed with ID pharmacist and we will try Rocephin and see if she has a reaction.  Acute encephalopathy I suspect this is secondary to sepsis.  Will hold gabapentin and Reglan for now.  AKI (acute kidney injury) Creatinine 1.88 on presentation and down to 1.11 today.  Macrocytic anemia Hemoglobin down to 8.5 with IV fluids.  Hold off on IV fluid hydration.  Vitamin B12 normal.  Will check ferritin and TIBC.  Bright red blood per rectum No bowel movement while here.  Watch hemoglobin closely.  If any bleeding may have to get rid of Eliquis.  Leukocytosis Secondary to sepsis  Alzheimer's type dementia with late onset without behavioral disturbance Continue Seroquel at night  Hypertension Continue metoprolol  Hypothyroid Continue Synthroid  Type 2 diabetes mellitus without complication, without long-term current use of insulin Fingersticks acceptable.  On sliding scale insulin.  Thrombocytopenia Secondary to  sepsis  Hypokalemia Replace potassium        Subjective: Patient came in with altered mental status and found to have sepsis with E. coli.  Likely urine source.  Patient answers some simple questions.  Confused about the year and who she lives with.  But able to follow commands.  Physical Exam: Vitals:   03/26/23 2041 03/27/23 0453 03/27/23 0810 03/27/23 1048  BP: (!) 139/59 (!) 142/60 (!) 177/80 (!) 145/61  Pulse: 94 76 84   Resp: Temp: 98.8 F (37.1 C) 98.2 F (36.8 C) 98.4 F (36.9 C)   TempSrc: Axillary Oral    SpO2: 93% 96% 98%   Weight: 68 kg 68 kg    Height:  (1.676 m)      Physical Exam HENT:     Head: Normocephalic.     Mouth/Throat:     Pharynx: No oropharyngeal exudate.  Eyes:     General: Lids are normal.     Conjunctiva/sclera: Conjunctivae normal.  Cardiovascular:     Rate and Rhythm: Normal rate and regular rhythm.     Heart sounds: Normal heart sounds, S1 normal and S2 normal.  Pulmonary:     Breath sounds: Normal breath sounds. No decreased breath sounds, wheezing, rhonchi or rales.  Abdominal:     Palpations: Abdomen is soft.     Tenderness: There is no abdominal tenderness.  Musculoskeletal:     Right lower leg: No swelling.     Left lower leg: No swelling.  Skin:    General: Skin is warm.     Findings: No rash.  Neurological:     Mental  Status: She is alert.     Comments: Able to straight leg raise bilaterally.     Data Reviewed: E. coli in blood cultures, urinalysis positive, creatinine 1.11, potassium 3.2.  Hemoglobin 8.5, platelet count 119  Family Communication: Spoke with daughter [name] at the bedside and [name] on the phone  Disposition: Status is: Likely given IV changed to inpatient today.  Patient has sepsis with E. coli.  Planned Discharge Destination: Home with Home Health versus rehab depending on hospital course.    Time spent: 35 minutes  Author: Alford Highland, MD 03/27/2023 12:49 PM  For on  call review [email or web address].

## 2023-03-27 NOTE — Evaluation (Signed)
Clinical/Bedside Swallow Evaluation Patient Details  Name: Jacqueline Tate MRN: [id number] Date of Birth: May 22, 1931  Today's Date: 03/27/2023 Time: SLP Start Time (ACUTE ONLY): 0815 SLP Stop Time (ACUTE ONLY): 0905 SLP Time Calculation (min) (ACUTE ONLY): 50 min  Past Medical History:  Past Medical History:  Diagnosis Date   Cancer    skin   Hypertension    Personal history of radiation therapy    for skin ca   Thyroid disease    Past Surgical History:  Past Surgical History:  Procedure Laterality Date   ABDOMINAL HYSTERECTOMY     BREAST BIOPSY Bilateral    years ago- neg   EYE SURGERY     HERNIA REPAIR     IR GENERIC HISTORICAL  02/28/2017   IR ANGIOGRAM SELECTIVE EACH ADDITIONAL VESSEL 02/28/2017 Richarda Overlie, MD [facility]   IR GENERIC HISTORICAL  02/28/2017   IR INFUSION THROMBOL ARTERIAL INITIAL (MS) 02/28/2017 Richarda Overlie, MD [facility]   IR GENERIC HISTORICAL  02/28/2017   IR INFUSION THROMBOL ARTERIAL INITIAL (MS) 02/28/2017 Richarda Overlie, MD [facility]   IR GENERIC HISTORICAL  02/28/2017   IR ANGIOGRAM SELECTIVE EACH ADDITIONAL VESSEL 02/28/2017 Richarda Overlie, MD [facility]   IR GENERIC HISTORICAL  02/28/2017   IR US GUIDE VASC ACCESS RIGHT 02/28/2017 Richarda Overlie, MD [facility]   IR GENERIC HISTORICAL  02/28/2017   IR ANGIOGRAM PULMONARY BILATERAL SELECTIVE 02/28/2017 Richarda Overlie, MD [facility]   IR GENERIC HISTORICAL  03/01/2017   IR THROMB F/U EVAL ART/VEN FINAL DAY (MS) 03/01/2017 Richarda Overlie, MD [facility]   KNEE SURGERY     HPI:  Pt is a 89 y/o female presented to ED on 03/26/23 for generalized weakness, lethargy, bleeding from hemorrhoids and on lips, and fall. Admitted for sepsis 2/2 UTI.  Per family report, pt has been refusing to eat and drink but was able to eat some food approximately 2 days ago and did have some ppropel this morning after coaxing.  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  CXR: No active disease. No  evidence of pneumonia or pulmonary edema.  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       Assessment / Plan / Recommendation  Clinical Impression   Pt seen for BSE today. Pt awakened easily to verbal/tactile stim but often kept eyes closed t/o evaluation and needed cues to open eyes to attend. Pt does have Baseline Dementia in setting of acute illness. Pt was verbal and answered basic questions re: self; followed basic commands w/ cues.  On RA, afebrile. WBC WNL.   Pt appears to present w/ functional pharyngeal phase swallowing in setting of declined Cognitive status; Baseline Dementia. However, Min Oral phase dysphagia was noted c/b reduced bolus management of increased textured foods -- suspect impact of Cognitive decline in setting of acute illness. ANY Cognitive decline can impact her overall awareness/timing of bolus management and swallow and safety during po tasks which increases risk for aspiration, choking.  Pt's risk for aspiration can be reduced when following general aspiration precautions and using a slighlty modified diet consistency of broken down foods during acute illness Pt does wear Dentures and requires support w/ placement. She also requires min-mod verbal/visual/tactile cues for follow through during po tasks and self-feeding.        Pt consumed several trials of ice chips, purees, minced/soft solids and thin  liquids via cup w/ No overt clinical s/s of aspiration noted: no decline in vocal quality; no cough, and no decline in respiratory status during/post trials. O2 sats remained 98%. Oral phase was adequate for bolus management and oral clearing of the liquids and puree boluses given. Mastication of softened/broken solids was increased in Time and bolus management was less coordinated and organized. Moistening the small boluses of solid trials helped but pt still expectorated 1 piece  fully. Pt attempted self-feeding but required min-mod support and guidance d/t the Cognitive decline, closed eyes. She was able to hold the Cup to drink which improves safety of swallowing.  OM Exam appeared [facility] w/ No unilateral weakness noted. Some confusion of OM tasks and oral care noted. Hand over hand guidance and visual cue was helpful to initiate po tasks, and denture placement.         In setting of baseline Dementia and Cognitive decline in setting of acute illness, recommend initiation of the dysphagia level 2(MINCED foods moistened for ease of oral phase/mastication) w/ thin liquids via Cup; general aspiration precautions; reduce Distractions during meals and engage pt during meals for self-feeding. Pills Crushed in Puree for safer swallowing as needed. Support w/ feeding at meals as needed. MD/NSG updated.  ST services will f/u tomorrow for ongoing trials to upgrade diet as pt's medical/Cognitive status' improve. Recommend follow w/ Palliative Care for GOC and education re: impact of Cognitive decline/Dementia on swallowing and oral intake in general. Precautions posted in room. MD/NSG updated, agreed. SLP Visit Diagnosis: Dysphagia, oral phase (R13.11) (in setting of illness, Baseline Dementia)    Aspiration Risk  Mild aspiration risk;Risk for inadequate nutrition/hydration    Diet Recommendation   Dysphagia level 2(MINCED foods moistened for ease of oral phase/mastication) w/ thin liquids via Cup; general aspiration precautions; reduce Distractions during meals and engage pt during meals for self-feeding. Support w/ feeding at meals as needed. Dentures placed for meals.  Medication Administration: Whole meds with puree (vs need to Crush in puree)    Other  Recommendations Recommended Consults:  (Dietician f/u) Oral Care Recommendations: Oral care BID;Oral care before and after PO;Staff/trained caregiver to provide oral care (Denture care)    Recommendations for follow up therapy are  one component of a multi-disciplinary discharge planning process, led by the attending physician.  Recommendations may be updated based on patient status, additional functional criteria and insurance authorization.  Follow up Recommendations Follow physician's recommendations for discharge plan and follow up therapies      Assistance Recommended at Discharge  FULL  Functional Status Assessment Patient has had a recent decline in their functional status and demonstrates the ability to make significant improvements in function in a reasonable and predictable amount of time.  Frequency and Duration min 2x/week  2 weeks       Prognosis Prognosis for improved oropharyngeal function: Fair (-Good) Barriers to Reach Goals: Cognitive deficits;Time post onset Barriers/Prognosis Comment: baseline Dementia in setting of illness      Swallow Study   General Date of Onset: 03/26/23 HPI: Pt is a 89 y/o female presented to ED on 03/26/23 for generalized weakness, lethargy, bleeding from hemorrhoids and on lips, and fall. Admitted for sepsis 2/2 UTI.  Per family report, pt has been refusing to eat and drink but was able to eat some food approximately 2 days ago and did have some ppropel this morning after coaxing.  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 Type of Study: Bedside  Swallow Evaluation Previous Swallow Assessment: none Diet Prior to this Study: NPO Temperature Spikes Noted: No (wbc 6.5) Respiratory Status: Room air History of Recent Intubation: No Behavior/Cognition: Alert;Cooperative;Pleasant mood;Confused;Distractible;Requires cueing (eyes closed often) Oral Cavity Assessment: Within Functional Limits Oral Care Completed by SLP: Yes Oral Cavity - Dentition: Dentures, top;Dentures, bottom (min ill-fitting on bottom) Vision: Functional for self-feeding (could hold cup to drink) Self-Feeding Abilities: Able to feed self;Needs assist;Needs set up Patient  Positioning: Upright in bed (needed positioning assistance) Baseline Vocal Quality: Normal Volitional Cough: Strong Volitional Swallow: Able to elicit    Oral/Motor/Sensory Function Overall Oral Motor/Sensory Function: Within functional limits   Ice Chips Ice chips: Within functional limits (grossly) Presentation: Spoon (x2) Other Comments: cues to attend   Thin Liquid Thin Liquid: Within functional limits Presentation: Cup;Self Fed (~6 ozs total) Other Comments: water, juice    Nectar Thick Nectar Thick Liquid: Not tested   Honey Thick Honey Thick Liquid: Not tested   Puree Puree: Within functional limits Presentation: Spoon (fed; 10 trials)   Solid     Solid: Impaired Presentation: Spoon (fed; 5 trials) Oral Phase Impairments: Poor awareness of bolus;Impaired mastication Oral Phase Functional Implications: Impaired mastication (min) Pharyngeal Phase Impairments:  (none) Other Comments: expectorated boluses x2        [name], MS, CCC-SLP Speech Language Pathologist Rehab Services; [facility] [place] [phone] (ascom) Jacey Eckerson 03/27/2023,11:55 AM

## 2023-03-27 NOTE — Hospital Course (Signed)
89 year old female with history of Alzheimer's dementia, hypertension, hypothyroidism, type 2 diabetes mellitus, chronic kidney disease, previous DVT PE on Eliquis.  Patient coming in with altered mental status Blood cultures and urine culture are positive for E. coli.  Patient was treated with ceftriaxone.

## 2023-03-27 NOTE — Progress Notes (Addendum)
Pharmacy - penicillin allergy assessment  Patient with E coli bacteremia.  Penicillin allergy documented as rash.  Due to Alzheimer's patient history may not be accurate so discussed with daughter in room as well.  Neither certain of reaction and daughter feels reaction likely occurred when patient was young and > 10 years ago (as she is not aware of patient ever having a reaction)  PEN FAST score is 0-1 (unknown if needed treatment)  Emergency medications for reaction ordered and patient given a ceftriaxone challenge dose of 500mg  IV x 1 and tolerated well. Ordered additional dose of ceftriaxone 1gm today and starting ceftriaxone 2gm IV q24h.      Juliette Alcide, PharmD, BCPS, BCIDP Work Cell: [phone] 03/27/2023 3:20 PM

## 2023-03-28 DIAGNOSIS — G9340 Encephalopathy, unspecified: Secondary | ICD-10-CM | POA: Diagnosis not present

## 2023-03-28 DIAGNOSIS — N179 Acute kidney failure, unspecified: Secondary | ICD-10-CM | POA: Diagnosis not present

## 2023-03-28 DIAGNOSIS — D539 Nutritional anemia, unspecified: Secondary | ICD-10-CM | POA: Diagnosis not present

## 2023-03-28 DIAGNOSIS — A419 Sepsis, unspecified organism: Secondary | ICD-10-CM | POA: Diagnosis not present

## 2023-03-28 DIAGNOSIS — E871 Hypo-osmolality and hyponatremia: Secondary | ICD-10-CM | POA: Insufficient documentation

## 2023-03-28 LAB — CBC
HCT: 30.5 % — ABNORMAL LOW (ref 36.0–46.0)
Hemoglobin: 10.4 g/dL — ABNORMAL LOW (ref 12.0–15.0)
MCH: 32.2 pg (ref 26.0–34.0)
MCHC: 34.1 g/dL (ref 30.0–36.0)
MCV: 94.4 fL (ref 80.0–100.0)
Platelets: 137 10*3/uL — ABNORMAL LOW (ref 150–400)
RBC: 3.23 MIL/uL — ABNORMAL LOW (ref 3.87–5.11)
RDW: 16 % — ABNORMAL HIGH (ref 11.5–15.5)
WBC: 5.6 10*3/uL (ref 4.0–10.5)
nRBC: 0.4 % — ABNORMAL HIGH (ref 0.0–0.2)

## 2023-03-28 LAB — URINE CULTURE

## 2023-03-28 LAB — BASIC METABOLIC PANEL
Anion gap: 11 (ref 5–15)
BUN: 13 mg/dL (ref 8–23)
CO2: 23 mmol/L (ref 22–32)
Calcium: 8.7 mg/dL — ABNORMAL LOW (ref 8.9–10.3)
Chloride: 97 mmol/L — ABNORMAL LOW (ref 98–111)
Creatinine, Ser: 0.65 mg/dL (ref 0.44–1.00)
GFR, Estimated: >60 mL/min (ref >60)
Glucose, Bld: 127 mg/dL — ABNORMAL HIGH (ref 70–99)
Potassium: 3.3 mmol/L — ABNORMAL LOW (ref 3.5–5.1)
Sodium: 131 mmol/L — ABNORMAL LOW (ref 135–145)

## 2023-03-28 LAB — CULTURE, BLOOD (ROUTINE X 2)

## 2023-03-28 LAB — GLUCOSE, CAPILLARY
Glucose-Capillary: 135 mg/dL — ABNORMAL HIGH (ref 70–99)
Glucose-Capillary: 119 mg/dL — ABNORMAL HIGH (ref 70–99)
Glucose-Capillary: 122 mg/dL — ABNORMAL HIGH (ref 70–99)
Glucose-Capillary: 120 mg/dL — ABNORMAL HIGH (ref 70–99)

## 2023-03-28 MED ORDER — POTASSIUM CHLORIDE CRYS ER 20 MEQ PO TBCR
40.0000 meq | EXTENDED_RELEASE_TABLET | Freq: Once | ORAL | Status: AC
  Administered 2023-03-28: 40 meq via ORAL
  Filled 2023-03-28: qty 2

## 2023-03-28 MED ORDER — GABAPENTIN 300 MG PO CAPS
300.0000 mg | ORAL_CAPSULE | Freq: Three times a day (TID) | ORAL | Status: DC
  Administered 2023-03-28 – 2023-04-03 (×16): 300 mg via ORAL
  Filled 2023-03-28 (×18): qty 1

## 2023-03-28 NOTE — Progress Notes (Addendum)
Pt's blood pressure been elevated.. Pt has been confused and impulsive, attempting to get out of bed and refusing all po meds. Pt c/o chest pain, bp 180/91, HR 95. Bishop Limbo NP notified. EKG, IV benadryl, prn hydralazine and sitter ordered.

## 2023-03-28 NOTE — TOC Initial Note (Signed)
Transition of Care [facility]) - Initial/Assessment Note    Patient Details  Name: Jacqueline Tate MRN: [id number] Date of Birth: 05-May-1931  [facility]) CM/SW Contact:    Jacqueline Fitch, RN Phone Number: 03/28/2023, 3:41 PM  Clinical Narrative:                    Admitted for: sepsis Admitted from: home alone PCP: baboff Current home health/prior home health/DME: rw and cane  Patient A&O x1.  Spoke with son Jacqueline Tate via phone and he requested to have his sister Jacqueline Tate called on a 3 way call  Jacqueline Tate states he states at the patients home from 7pm-6 am Jacqueline Tate comes at 6 am to administer medications Jacqueline Tate comes to check on her around 10 am and 1 pm And Jacqueline Tate comes and stays with her from 5pm-7pm  Therapy recommending home health.  Jacqueline Tate and Jacqueline Tate states they do not have a preference of home health agency.  Referral made to [facility] with [facility] and Jacqueline Tate want to pursue private pay PCS in the home.  They are in agreement to work with a placement agency company and do not have a preference of company.  Referral made to [facility] with [facility]       Patient Goals and CMS Choice            Expected Discharge Plan and Services                                              Prior Living Arrangements/Services                       Activities of Daily Living Home Assistive Devices/Equipment: Cane (specify quad or straight), Walker (specify type) ADL Screening (condition at time of admission) Patient's cognitive ability adequate to safely complete daily activities?: No Is the patient deaf or have difficulty hearing?: No Does the patient have difficulty seeing, even when wearing glasses/contacts?: No Does the patient have difficulty concentrating, remembering, or making decisions?: Yes Patient able to express need for assistance with ADLs?: No Does the patient have difficulty dressing or bathing?:  Yes Independently performs ADLs?: No Communication: Needs assistance Is this a change from baseline?: Pre-admission baseline Dressing (OT): Needs assistance Is this a change from baseline?: Pre-admission baseline Grooming: Needs assistance Is this a change from baseline?: Pre-admission baseline Feeding: Independent Bathing: Needs assistance Is this a change from baseline?: Pre-admission baseline Toileting: Needs assistance Is this a change from baseline?: Pre-admission baseline In/Out Bed: Needs assistance Is this a change from baseline?: Pre-admission baseline Walks in Home: Needs assistance Is this a change from baseline?: Pre-admission baseline Does the patient have difficulty walking or climbing stairs?: Yes Weakness of Legs: Both Weakness of Arms/Hands: Both  Permission Sought/Granted                  Emotional Assessment              Admission diagnosis:  Acute encephalopathy [G93.40] Generalized weakness [R53.1] AKI (acute kidney injury) [N17.9] Severe sepsis [A41.9, R65.20] Patient Active Problem List   Diagnosis Date Noted   Hyponatremia 03/28/2023   Severe sepsis 03/27/2023   Hypokalemia 03/27/2023   Thrombocytopenia 03/27/2023   Acute encephalopathy 03/26/2023   AKI (acute kidney injury) 03/26/2023   Macrocytic anemia 03/26/2023  Bright red blood per rectum 03/26/2023   Leukocytosis 03/26/2023   Alzheimer's type dementia with late onset without behavioral disturbance 02/23/2023   History of DVT (deep vein thrombosis) 08/05/2020   History of pulmonary embolus (PE) 08/05/2020   Type 2 diabetes mellitus without complication, without long-term current use of insulin 12/30/2019   Bilateral carotid artery stenosis 12/28/2018   Pulmonary embolism 02/28/2017   Lumbar radiculitis 11/28/2014   Lumbosacral stenosis with neurogenic claudication 11/28/2014   Hypothyroid 09/15/2014   OA (osteoarthritis) 09/15/2014   Intermittent palpitations 07/07/2014    GERD (gastroesophageal reflux disease) 06/11/2014   Hypertension 06/11/2014   Cervical radiculitis 05/26/2014   Cervical spondylosis 05/26/2014   DDD (degenerative disc disease), cervical 05/26/2014   PCP:  Kandyce Rud, MD Pharmacy:   [facility] - [place], [place] - [address] [place] [address] Phone: [phone] Fax: [phone]  [id number] [place], [place] - [address] AT [facility] [address] [place] [address] Phone: [phone] Fax: [phone]     Social Determinants of Health (SDOH) Social History: SDOH Screenings   Food Insecurity: Patient Declined (03/27/2023)  Transportation Needs: Patient Declined (03/27/2023)  Utilities: Patient Declined (03/27/2023)  Tobacco Use: Low Risk  (03/26/2023)   SDOH Interventions: Housing Interventions: Patient Refused   Readmission Risk Interventions     No data to display

## 2023-03-28 NOTE — Progress Notes (Signed)
Physical Therapy Treatment Patient Details Name: Jacqueline Tate MRN: [id number] DOB: November 07, 1931 Today's Date: 03/28/2023   History of Present Illness 89 y/o female presented to ED on 03/26/23 for generalized weakness, lethargy, bleeding from hemorrhoids and on lips, and fall. Admitted for sepsis 2/2 UTI. 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    PT Comments    Patient is agreeable to PT. She was seen with sitter at the bedside. The patient is able to follow commands with increased time and multi-modal cues. The patient is able to stand and walk a short distance with hand held assistance in the room. Daughter present at end of session. Recommend to continue PT to maximize independence and to decrease caregiver burden. Anticipate the need for frequent supervision/assistance at discharge with PT recommended after this hospital stay.    Recommendations for follow up therapy are one component of a multi-disciplinary discharge planning process, led by the attending physician.  Recommendations may be updated based on patient status, additional functional criteria and insurance authorization.  Follow Up Recommendations       Assistance Recommended at Discharge Frequent or constant Supervision/Assistance  Patient can return home with the following A lot of help with walking and/or transfers;A little help with bathing/dressing/bathroom;Assistance with cooking/housework;Direct supervision/assist for medications management;Direct supervision/assist for financial management;Assist for transportation;Help with stairs or ramp for entrance   Equipment Recommendations  Rolling walker (2 wheels)    Recommendations for Other Services       Precautions / Restrictions Precautions Precautions: Fall Precaution Comments: dementia, sitter present today Restrictions Weight Bearing Restrictions: No     Mobility  Bed Mobility Overal bed mobility: Needs Assistance Bed  Mobility: Supine to Sit     Supine to sit: Min assist     General bed mobility comments: assistance for trunk support. increased time and effort required    Transfers Overall transfer level: Needs assistance Equipment used: Rolling walker (2 wheels) Transfers: Sit to/from Stand Sit to Stand: Min assist           General transfer comment: cues for task initiation. 2 standing bouts performed    Ambulation/Gait Ambulation/Gait assistance: Min assist Gait Distance (Feet): 4 Feet Assistive device: 1 person hand held assist Gait Pattern/deviations: Trunk flexed, Narrow base of support Gait velocity: decreased     General Gait Details: short distance ambulation in room. verbal cues for technique.   Stairs             Wheelchair Mobility    Modified Rankin (Stroke Patients Only)       Balance Overall balance assessment: Needs assistance, History of Falls Sitting-balance support: Feet supported, No upper extremity supported Sitting balance-Leahy Scale: Good     Standing balance support: Bilateral upper extremity supported Standing balance-Leahy Scale: Poor Standing balance comment: external support provided for safety                            Cognition Arousal/Alertness: Awake/alert Behavior During Therapy: WFL for tasks assessed/performed Overall Cognitive Status: History of cognitive impairments - at baseline                                 General Comments: patient has history of dementia. she keeps eyes closed intermittently.        Exercises      General Comments  Pertinent Vitals/Pain Pain Assessment Pain Assessment: No/denies pain    Home Living                          Prior Function            PT Goals (current goals can now be found in the care plan section) Acute Rehab PT Goals Patient Stated Goal: patient unable to state PT Goal Formulation: Patient unable to participate in goal  setting Time For Goal Achievement: 04/10/23 Potential to Achieve Goals: Fair Progress towards PT goals: Progressing toward goals    Frequency    Min 3X/week      PT Plan Current plan remains appropriate    Co-evaluation              AM-PAC PT "6 Clicks" Mobility   Outcome Measure  Help needed turning from your back to your side while in a flat bed without using bedrails?: A Little Help needed moving from lying on your back to sitting on the side of a flat bed without using bedrails?: A Little Help needed moving to and from a bed to a chair (including a wheelchair)?: A Little Help needed standing up from a chair using your arms (e.g., wheelchair or bedside chair)?: A Little Help needed to walk in hospital room?: A Little Help needed climbing 3-5 steps with a railing? : Total 6 Click Score: 16    End of Session   Activity Tolerance: Patient tolerated treatment well Patient left: in chair;with call bell/phone within reach;with family/visitor present;with nursing/sitter in room (daughter and sitter present) Nurse Communication: Mobility status PT Visit Diagnosis: Unsteadiness on feet (R26.81);Muscle weakness (generalized) (M62.81);History of falling (Z91.81);Difficulty in walking, not elsewhere classified (R26.2)     Time: 4098-1191 PT Time Calculation (min) (ACUTE ONLY): 16 min  Charges:  $Therapeutic Activity: 8-22 mins                     Donna Bernard, PT, MPT    Ina Homes 03/28/2023, 2:48 PM

## 2023-03-28 NOTE — Progress Notes (Signed)
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  Patient coming in with altered mental status also had foul-smelling urine.  Patient not eating and drinking very well.  4/15.  Blood cultures positive for E. coli.  Urine culture pending.  Case discussed with ID pharmacist and we will try to give Rocephin instead of Levaquin.  Mental status good enough to be placed on a diet and work with physical therapy. 4/16.  Continue Rocephin.  Patient was a little agitated last night.  Spoke with family about discharge plans.  Family will speak with each other and get back with transitional care team.  Physical therapy currently recommending home with home health.  Assessment and Plan: * Severe sepsis Present on admission.  E. coli in blood cultures and urine cultures.  Patient has acute metabolic encephalopathy, thrombocytopenia, acute kidney injury, leukocytosis, tachycardia and fever of 101.1 at 1831 on 03/26/2023.  Patient initially placed on Levaquin.  Patient had a test dose of Rocephin and tolerated it.  Continue Rocephin.  Follow-up sensitivities on blood and urine cultures.  Acute encephalopathy I suspect this is secondary to sepsis and dementia.  Can go back on lower dose gabapentin today.  Patient had a rough night last night.  AKI (acute kidney injury) Creatinine 1.88 on presentation and down to 0.65 today.  Macrocytic anemia Ferritin elevated at 156 going along with anemia of chronic disease.  Hemoglobin up at 10.4 today.  Bright red blood per rectum No bowel movement while here.  Hemoglobin higher today than yesterday.  Leukocytosis Secondary to sepsis.  This has improved.  Alzheimer's type dementia with  late onset without behavioral disturbance Continue Seroquel at night  Hypertension Continue metoprolol.  Added Norvasc.  Hypothyroid Continue Synthroid  Type 2 diabetes mellitus without complication, without long-term current use of insulin Fingersticks acceptable.  On sliding scale insulin.  Hyponatremia Sodium 131 today.  Continue to monitor.  Thrombocytopenia Secondary to sepsis.  Today's platelet count 137.  Hypokalemia Replace potassium again today        Subjective: Patient feels okay.  Offers no complaints.  Admitted with sepsis.  E. coli growing in blood and urine cultures.  Physical Exam: Vitals:   03/27/23 2216 03/28/23 0335 03/28/23 0733 03/28/23 0902  BP: (!) 180/91 (!) 191/97 (!) 187/100 (!) 159/96  Pulse: 97 (!) 104 (!) 108 77  Resp: 20 18    Temp: 99.6 F (37.6 C) 98.8 F (37.1 C) 97.8 F (36.6 C)   TempSrc:   Oral   SpO2: 98% 97% 100%   Weight:      Height:       Physical Exam HENT:     Head: Normocephalic.     Mouth/Throat:     Pharynx: No oropharyngeal exudate.  Eyes:     General: Lids are normal.     Conjunctiva/sclera: Conjunctivae normal.  Cardiovascular:     Rate and Rhythm: Normal rate and regular rhythm.     Heart sounds: Normal heart sounds, S1 normal and S2 normal.  Pulmonary:     Breath sounds: Normal breath sounds. No decreased breath sounds, wheezing, rhonchi or rales.  Abdominal:     Palpations: Abdomen is soft.     Tenderness: There is no abdominal tenderness.  Musculoskeletal:     Right lower leg: No swelling.     Left lower leg: No swelling.  Skin:    General: Skin is warm.     Findings: No rash.  Neurological:     Mental Status: She is alert.     Comments: Able to straight leg raise bilaterally.     Data Reviewed: White blood cell count 5.6, platelet count 137, hemoglobin 10.4, ferritin 156, creatinine 0.65, sodium 131, potassium 3.3  Family Communication: Spoke with daughter [name] at the bedside and son  [name] on the phone  Disposition: Status is: Inpatient Remains inpatient appropriate because: Awaiting sensitivities on E. coli in blood and urine cultures.  Treating with IV antibiotics for sepsis.  Planned Discharge Destination: Home with Home Health spoke with family about disposition.  Son [name] at [facility] [employer] taking care of her at home.  Home health not there all the time.  He will speak with family about whether placement may be needed or whether to set up 24/7 care at home.    Time spent: 27 minutes  Author: Alford Highland, MD 03/28/2023 2:00 PM  For on call review [email or web address].

## 2023-03-28 NOTE — Progress Notes (Signed)
Mobility Specialist - Progress Note   03/28/23 1559  Mobility  Activity Transferred to/from [facility]  Level of Assistance Minimal assist, patient does 75% or more  Assistive Device None  Distance Ambulated (ft) 2 ft  Activity Response Tolerated well  $Mobility charge 1 Mobility   MS responding to call bell, Pt requesting to get back in bed. Pt STS from [facility] and transferred to the bed CGA. Pt left supine with alarm set and needs within reach.   Zetta Bills Mobility Specialist 03/28/23 4:02 PM

## 2023-03-28 NOTE — Assessment & Plan Note (Signed)
Sodium 131 today.  Continue to monitor.

## 2023-03-29 DIAGNOSIS — R627 Adult failure to thrive: Secondary | ICD-10-CM | POA: Insufficient documentation

## 2023-03-29 DIAGNOSIS — F02818 Dementia in other diseases classified elsewhere, unspecified severity, with other behavioral disturbance: Secondary | ICD-10-CM

## 2023-03-29 DIAGNOSIS — N390 Urinary tract infection, site not specified: Secondary | ICD-10-CM | POA: Insufficient documentation

## 2023-03-29 DIAGNOSIS — A419 Sepsis, unspecified organism: Secondary | ICD-10-CM | POA: Diagnosis not present

## 2023-03-29 DIAGNOSIS — G9340 Encephalopathy, unspecified: Secondary | ICD-10-CM | POA: Diagnosis not present

## 2023-03-29 DIAGNOSIS — G309 Alzheimer's disease, unspecified: Secondary | ICD-10-CM

## 2023-03-29 DIAGNOSIS — A4151 Sepsis due to Escherichia coli [E. coli]: Secondary | ICD-10-CM | POA: Diagnosis not present

## 2023-03-29 DIAGNOSIS — D509 Iron deficiency anemia, unspecified: Secondary | ICD-10-CM | POA: Insufficient documentation

## 2023-03-29 LAB — CULTURE, BLOOD (ROUTINE X 2)

## 2023-03-29 LAB — GLUCOSE, CAPILLARY
Glucose-Capillary: 136 mg/dL — ABNORMAL HIGH (ref 70–99)
Glucose-Capillary: 123 mg/dL — ABNORMAL HIGH (ref 70–99)
Glucose-Capillary: 133 mg/dL — ABNORMAL HIGH (ref 70–99)
Glucose-Capillary: 107 mg/dL — ABNORMAL HIGH (ref 70–99)

## 2023-03-29 LAB — URINE CULTURE: Culture: >=100000 — AB

## 2023-03-29 LAB — BASIC METABOLIC PANEL
Anion gap: 12 (ref 5–15)
BUN: 21 mg/dL (ref 8–23)
CO2: 23 mmol/L (ref 22–32)
Calcium: 9.2 mg/dL (ref 8.9–10.3)
Chloride: 99 mmol/L (ref 98–111)
Creatinine, Ser: 0.96 mg/dL (ref 0.44–1.00)
GFR, Estimated: 57 mL/min — ABNORMAL LOW (ref >60)
Glucose, Bld: 124 mg/dL — ABNORMAL HIGH (ref 70–99)
Potassium: 3.6 mmol/L (ref 3.5–5.1)
Sodium: 134 mmol/L — ABNORMAL LOW (ref 135–145)

## 2023-03-29 LAB — MAGNESIUM: Magnesium: 1.7 mg/dL (ref 1.7–2.4)

## 2023-03-29 MED ORDER — MELATONIN 5 MG PO TABS
5.0000 mg | ORAL_TABLET | Freq: Every day | ORAL | Status: DC
  Administered 2023-03-30 – 2023-04-02 (×4): 5 mg via ORAL
  Filled 2023-03-29 (×4): qty 1

## 2023-03-29 MED ORDER — LABETALOL HCL 5 MG/ML IV SOLN
10.0000 mg | Freq: Four times a day (QID) | INTRAVENOUS | Status: DC | PRN
  Administered 2023-03-29: 10 mg via INTRAVENOUS
  Filled 2023-03-29: qty 4

## 2023-03-29 MED ORDER — POLYSACCHARIDE IRON COMPLEX 150 MG PO CAPS
150.0000 mg | ORAL_CAPSULE | Freq: Every day | ORAL | Status: DC
  Administered 2023-03-29 – 2023-04-03 (×6): 150 mg via ORAL
  Filled 2023-03-29 (×6): qty 1

## 2023-03-29 MED ORDER — LACTULOSE 10 GM/15ML PO SOLN
20.0000 g | Freq: Once | ORAL | Status: AC
  Administered 2023-03-30: 20 g via ORAL
  Filled 2023-03-29: qty 30

## 2023-03-29 MED ORDER — POTASSIUM CHLORIDE IN NACL 20-0.9 MEQ/L-% IV SOLN
INTRAVENOUS | Status: DC
  Filled 2023-03-29 (×6): qty 1000

## 2023-03-29 MED ORDER — OLANZAPINE 2.5 MG PO TABS: 2.5000 mg | ORAL_TABLET | Freq: Two times a day (BID) | ORAL | Status: DC | PRN

## 2023-03-29 MED ORDER — CEFAZOLIN SODIUM-DEXTROSE 2-4 GM/100ML-% IV SOLN
2.0000 g | Freq: Three times a day (TID) | INTRAVENOUS | Status: DC
  Administered 2023-03-30 – 2023-04-01 (×7): 2 g via INTRAVENOUS
  Filled 2023-03-29 (×8): qty 100

## 2023-03-29 NOTE — Progress Notes (Signed)
Occupational Therapy Treatment Patient Details Name: Jacqueline Tate MRN: [id number] DOB: 1931/05/28 Today's Date: 03/29/2023   History of present illness 89 y/o female presented to ED on 03/26/23 for generalized weakness, lethargy, bleeding from hemorrhoids and on lips, and fall. Admitted for sepsis 2/2 UTI. 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   OT comments  Pt received seated on [facility], with daughter and nurse tech in room. Appearing lethargic, intermittently leaning forward, eyes closed.  T/f to standing MIN A with lots of cues, handheld assist. See flowsheet below for further details of session. Left semi-reclined in bed with all needs in reach. Pt showing limited rehab potential today; recommend follow-up therapy and 24/7 assistance.    Recommendations for follow up therapy are one component of a multi-disciplinary discharge planning process, led by the attending physician.  Recommendations may be updated based on patient status, additional functional criteria and insurance authorization.    Assistance Recommended at Discharge Frequent or constant Supervision/Assistance  Patient can return home with the following  A lot of help with walking and/or transfers;A lot of help with bathing/dressing/bathroom;Assistance with cooking/housework;Assistance with feeding;Direct supervision/assist for financial management;Direct supervision/assist for medications management;Assist for transportation;Help with stairs or ramp for entrance   Equipment Recommendations  None recommended by OT    Recommendations for Other Services      Precautions / Restrictions Precautions Precautions: Fall Restrictions Weight Bearing Restrictions: No       Mobility Bed Mobility Overal bed mobility: Needs Assistance Bed Mobility: Sit to Supine       Sit to supine: Mod assist, +2 for physical assistance   General bed mobility comments: very lethargic; pt not helping much     Transfers Overall transfer level: Needs assistance Equipment used: 1 person hand held assist Transfers: Sit to/from Stand Sit to Stand: Min assist           General transfer comment: strong cues for pt to stand up; she was unable to find the handrail with her R hand without physical OT assist     Balance Overall balance assessment: Needs assistance, History of Falls Sitting-balance support: Feet supported, No upper extremity supported Sitting balance-Leahy Scale: Fair Sitting balance - Comments: pt leaning forward on BSC intermittently; needing cues to sit upright   Standing balance support: Single extremity supported Standing balance-Leahy Scale: Poor Standing balance comment: handheld assist today                           ADL either performed or assessed with clinical judgement   ADL Overall ADL's : Needs assistance/impaired Eating/Feeding: Maximal assistance Eating/Feeding Details (indicate cue type and reason): daughter assisting with eating applesauce and water dependently. Daughter aware of "no straws" order; states pt has been doing ok with straw and no coughing.                     Toilet Transfer: Ambulation;BSC/3in1;Moderate assistance Toilet Transfer Details (indicate cue type and reason): handheld assist; pt not following verbal cues well; needing physical cues to transfer safely                Extremity/Trunk Assessment Upper Extremity Assessment Upper Extremity Assessment: Generalized weakness   Lower Extremity Assessment Lower Extremity Assessment: Defer to PT evaluation        Vision       Perception     Praxis      Cognition Arousal/Alertness: Lethargic (eyes  closed throughout session) Behavior During Therapy: Restless, Flat affect Overall Cognitive Status: History of cognitive impairments - at baseline                                 General Comments: Daughter in room. Pt keeping eyes closed during  session. Responding to daughter's voice. Difficulty following commands during session.        Exercises      Shoulder Instructions       General Comments Very limited participation today; appears lethargic and in pain, and with eyes closed throughout session dispite cues.    Pertinent Vitals/ Pain       Pain Assessment Pain Assessment: PAINAD Breathing: normal Negative Vocalization: occasional moan/groan, low speech, negative/disapproving quality Facial Expression: sad, frightened, frown Body Language: tense, distressed pacing, fidgeting Consolability: distracted or reassured by voice/touch PAINAD Score: 4 Pain Location: unknown; pt unable to state Pain Descriptors / Indicators: Grimacing, Guarding, Moaning Pain Intervention(s): Limited activity within patient's tolerance, Monitored during session, Other (comment) (discussed with RN)  Home Living                                          Prior Functioning/Environment              Frequency  Min 2X/week        Progress Toward Goals  OT Goals(current goals can now be found in the care plan section)  Progress towards OT goals: Not progressing toward goals - comment (pt very lethargic and barely participatory)  Acute Rehab OT Goals Patient Stated Goal: to get back to bed OT Goal Formulation: Patient unable to participate in goal setting Time For Goal Achievement: 04/10/23 Potential to Achieve Goals: Poor ADL Goals Pt Will Perform Grooming: standing;with supervision Pt Will Perform Lower Body Dressing: sit to/from stand;with supervision Pt Will Transfer to Toilet: with modified independence;ambulating Pt Will Perform Toileting - Clothing Manipulation and hygiene: sit to/from stand;with supervision  Plan Discharge plan remains appropriate (as long as pt has 24/7 assistance of capable caregiver; otherwise, needs SNF)    Co-evaluation                 AM-PAC OT "6 Clicks" Daily Activity      Outcome Measure   Help from another person eating meals?: Total Help from another person taking care of personal grooming?: A Lot Help from another person toileting, which includes using toliet, bedpan, or urinal?: Total Help from another person bathing (including washing, rinsing, drying)?: Total Help from another person to put on and taking off regular upper body clothing?: Total Help from another person to put on and taking off regular lower body clothing?: Total 6 Click Score: 7    End of Session Equipment Utilized During Treatment: Other (comment) (BSC)  OT Visit Diagnosis: Unsteadiness on feet (R26.81);Muscle weakness (generalized) (M62.81);History of falling (Z91.81);Pain   Activity Tolerance Patient limited by lethargy;Patient limited by pain   Patient Left in bed;with call bell/phone within reach;with bed alarm set;with nursing/sitter in room;with family/visitor present   Nurse Communication Mobility status;Patient requests pain meds        Time: 1191-4782 OT Time Calculation (min): 13 min  Charges: OT General Charges $OT Visit: 1 Visit OT Treatments $Self Care/Home Management : 8-22 mins  Linward Foster, MS, OTR/L   Alvester Morin  03/29/2023, 11:56 AM

## 2023-03-29 NOTE — Progress Notes (Signed)
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  Patient coming in with altered mental status Blood cultures and urine culture are positive for E. coli.  Patient was treated with ceftriaxone.    Principal Problem:   Severe sepsis Active Problems:   Acute encephalopathy   AKI (acute kidney injury)   Macrocytic anemia   Bright red blood per rectum   Leukocytosis   Alzheimer's type dementia with late onset without behavioral disturbance   Hypertension   Hypothyroid   Type 2 diabetes mellitus without complication, without long-term current use of insulin   Hypokalemia   Thrombocytopenia   Hyponatremia   Alzheimer's dementia with behavioral disturbance   Failure to thrive in adult   E. coli septicemia   E. coli UTI   Assessment and Plan:  * Severe sepsis secondary to urinary tract infection. E. coli septicemia secondary to UTI. E. coli urinary tract infection. Patient met severe sepsis criteria at time of admission with tachycardia, tachypnea and leukocytosis, acute kidney injury and altered mental status. This is secondary to UTI secondary to E. coli infection. Continue Rocephin for 7 days, repeat blood culture today.  Acute encephalopathy Alzheimer's dementia with behavioral disturbance. Patient chronically taking Seroquel 75 mg every evening, she still could not sleep last night, she was agitated.  I will add melatonin to the regimen.  Failure to thrive. Anorexia. Met with patient daughter and son, patient condition has been deteriorating for the last few months, she eats very little, she does not drink liquid. I will start some IV fluids to prevent dehydration, obtain palliative  consult.  AKI (acute kidney injury) Hyponatremia. Hypokalemia. Renal function has normalized, potassium also normalized.  Continue some fluids to prevent dehydration.  Bright red blood per rectum Iron deficient anemia. Rectal bleeding appears to be secondary to hemorrhoids, no additional bleeding.  Patient has significant iron deficiency, started on iron supplement.  B12 level normal.  Hypertension Continue metoprolol.  Added Norvasc.  Hypothyroid Continue Synthroid  Type 2 diabetes mellitus without complication, without long-term current use of insulin Continue sliding scale insulin.   Thrombocytopenia Secondary to sepsis.  Condition improving.        Subjective:  Patient was very agitated today, could not sleep last night. She is very confused.  Physical Exam: Vitals:   03/29/23 0348 03/29/23 0402 03/29/23 0749 03/29/23 1139  BP: (!) 144/81  (!) 158/86 (!) 162/90  Pulse: (!) 103  100 (!) 105  Resp: Temp: 98.7 F (37.1 C)  98.1 F (36.7 C)   TempSrc: Axillary     SpO2: 100%  98% 97%  Weight:  66.6 kg    Height:       General exam: Appears calm and comfortable  Respiratory system: Clear to auscultation. Respiratory effort normal. Cardiovascular system: S1 & S2 heard, RRR. No JVD, murmurs, rubs, gallops or clicks. No pedal edema. Gastrointestinal system: Abdomen is nondistended, soft and nontender. No organomegaly or masses felt. Normal bowel sounds heard. Central nervous system: Alert and confused. Extremities: Symmetric 5 x 5 power. Skin: No rashes, lesions or ulcers Psychiatry:  Mood & affect appropriate.    Data Reviewed:  Lab results reviewed.  Family Communication: Son and daughter updated at bedside.  Disposition: Status is: Inpatient Remains inpatient appropriate because: Severity of disease, IV antibiotics. Plan to discharge home with home care.     Time spent: 35 minutes  Author: Marrion Coy, MD 03/29/2023 1:34 PM  For  on call review [email or web address].

## 2023-03-30 DIAGNOSIS — N390 Urinary tract infection, site not specified: Secondary | ICD-10-CM | POA: Diagnosis not present

## 2023-03-30 DIAGNOSIS — G301 Alzheimer's disease with late onset: Secondary | ICD-10-CM | POA: Diagnosis not present

## 2023-03-30 DIAGNOSIS — Z515 Encounter for palliative care: Secondary | ICD-10-CM

## 2023-03-30 DIAGNOSIS — B9620 Unspecified Escherichia coli [E. coli] as the cause of diseases classified elsewhere: Secondary | ICD-10-CM

## 2023-03-30 DIAGNOSIS — G309 Alzheimer's disease, unspecified: Secondary | ICD-10-CM | POA: Diagnosis not present

## 2023-03-30 DIAGNOSIS — N179 Acute kidney failure, unspecified: Secondary | ICD-10-CM | POA: Diagnosis not present

## 2023-03-30 DIAGNOSIS — E872 Acidosis, unspecified: Secondary | ICD-10-CM | POA: Insufficient documentation

## 2023-03-30 DIAGNOSIS — A419 Sepsis, unspecified organism: Secondary | ICD-10-CM | POA: Diagnosis not present

## 2023-03-30 DIAGNOSIS — A4151 Sepsis due to Escherichia coli [E. coli]: Secondary | ICD-10-CM | POA: Diagnosis not present

## 2023-03-30 DIAGNOSIS — R531 Weakness: Secondary | ICD-10-CM | POA: Diagnosis not present

## 2023-03-30 DIAGNOSIS — R627 Adult failure to thrive: Secondary | ICD-10-CM

## 2023-03-30 LAB — BASIC METABOLIC PANEL
Anion gap: 12 (ref 5–15)
BUN: 27 mg/dL — ABNORMAL HIGH (ref 8–23)
CO2: 21 mmol/L — ABNORMAL LOW (ref 22–32)
Calcium: 8.7 mg/dL — ABNORMAL LOW (ref 8.9–10.3)
Chloride: 104 mmol/L (ref 98–111)
Creatinine, Ser: 1.1 mg/dL — ABNORMAL HIGH (ref 0.44–1.00)
GFR, Estimated: 48 mL/min — ABNORMAL LOW (ref >60)
Glucose, Bld: 111 mg/dL — ABNORMAL HIGH (ref 70–99)
Potassium: 3.4 mmol/L — ABNORMAL LOW (ref 3.5–5.1)
Sodium: 137 mmol/L (ref 135–145)

## 2023-03-30 LAB — GLUCOSE, CAPILLARY
Glucose-Capillary: 124 mg/dL — ABNORMAL HIGH (ref 70–99)
Glucose-Capillary: 221 mg/dL — ABNORMAL HIGH (ref 70–99)
Glucose-Capillary: 53 mg/dL — ABNORMAL LOW (ref 70–99)
Glucose-Capillary: 87 mg/dL (ref 70–99)
Glucose-Capillary: 97 mg/dL (ref 70–99)

## 2023-03-30 LAB — MAGNESIUM: Magnesium: 1.9 mg/dL (ref 1.7–2.4)

## 2023-03-30 LAB — PHOSPHORUS: Phosphorus: 4.1 mg/dL (ref 2.5–4.6)

## 2023-03-30 LAB — CULTURE, BLOOD (ROUTINE X 2): Culture: NO GROWTH

## 2023-03-30 MED ORDER — SODIUM BICARBONATE 650 MG PO TABS
650.0000 mg | ORAL_TABLET | Freq: Two times a day (BID) | ORAL | Status: DC
  Administered 2023-03-30 – 2023-04-02 (×8): 650 mg via ORAL
  Filled 2023-03-30 (×8): qty 1

## 2023-03-30 MED ORDER — POTASSIUM CHLORIDE CRYS ER 20 MEQ PO TBCR
40.0000 meq | EXTENDED_RELEASE_TABLET | Freq: Once | ORAL | Status: AC
  Administered 2023-03-30: 40 meq via ORAL
  Filled 2023-03-30: qty 2

## 2023-03-30 NOTE — TOC Progression Note (Signed)
Transition of Care [facility]) - Progression Note    Patient Details  Name: Jacqueline Tate MRN: [id number] Date of Birth: [birth year]  [facility] of [facility] CM/SW Contact  Chapman Fitch, RN Phone Number: 03/30/2023, 2:55 PM  Clinical Narrative:    Per NP order outpatient palliative referral made to [facility] with [facility]         Expected Discharge Plan and Services                                               Social Determinants of Health (SDOH) Interventions SDOH Screenings   Food Insecurity: Patient Declined (03/27/2023)  Transportation Needs: Patient Declined (03/27/2023)  Utilities: Patient Declined (03/27/2023)  Tobacco Use: Low Risk  (03/26/2023)    Readmission Risk Interventions     No data to display

## 2023-03-30 NOTE — Progress Notes (Signed)
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  Patient coming in with altered mental status Blood cultures and urine culture are positive for E. coli.  Patient was treated with ceftriaxone.    Principal Problem:   Severe sepsis Active Problems:   Acute encephalopathy   AKI (acute kidney injury)   Macrocytic anemia   Bright red blood per rectum   Leukocytosis   Alzheimer's type dementia with late onset without behavioral disturbance   Hypertension   Hypothyroid   Type 2 diabetes mellitus without complication, without long-term current use of insulin   Hypokalemia   Thrombocytopenia   Hyponatremia   Alzheimer's dementia with behavioral disturbance   Failure to thrive in adult   E. coli septicemia   E. coli UTI   Iron deficiency anemia   Assessment and Plan: * Severe sepsis secondary to urinary tract infection. E. coli septicemia secondary to UTI. E. coli urinary tract infection. Patient met severe sepsis criteria at time of admission with tachycardia, tachypnea and leukocytosis, acute kidney injury and altered mental status. This is secondary to UTI secondary to E. coli infection. Repeat blood cultures so far negative.  Final culture results showed a pansensitive E. coli.  Will change to Keflex at time of discharge.     Acute encephalopathy Alzheimer's dementia with behavioral disturbance. Patient chronically taking Seroquel 75 mg every evening, she still could not sleep last night, she was agitated.  Added melatonin to the regimen. Patient slept better last night, no agitation.   Failure to thrive. Anorexia. Had a family meeting with the son and the daughter, patient has not been  eating at home, she lost 9 pounds of body weight within a month.  Long-term prognosis is poor, meeting with palliative care present, decision was made to continue palliative care at home, set up home care for now.  If condition deteriorates, patient can be transferred to hospice.  We also talk about DNR status, family need to talk among themselves to make a decision.   AKI (acute kidney injury) Hyponatremia. Hypokalemia. Mild metabolic acidosis. Give additional potassium, also give sodium bicarb for mild metabolic acidosis.   Bright red blood per rectum Iron deficient anemia. Rectal bleeding appears to be secondary to hemorrhoids, no additional bleeding.  Patient has significant iron deficiency, started on iron supplement.  B12 level normal.   Hypertension Continue metoprolol.  Added Norvasc.   Hypothyroid Continue Synthroid   Type 2 diabetes mellitus without complication, without long-term current use of insulin Continue sliding scale insulin.     Thrombocytopenia Secondary to sepsis.  Condition improving.        Subjective:  Patient slept better last night, no agitation today.  Confused.  Not short of breath or hypoxia.  Physical Exam: Vitals:   03/29/23 2115 03/30/23 0530 03/30/23 0628 03/30/23 0837  BP: 116/70 120/69  (!) 159/81  Pulse:  (!) 104  95  Resp: Temp: 98.4 F (36.9 C) (!) 96.9 F (36.1 C) 98.1 F (36.7 C) 98 F (36.7 C)  TempSrc: Oral  Oral Oral  SpO2: 97% 95%  97%  Weight:      Height:       General exam: Appears calm and comfortable  Respiratory  system: Clear to auscultation. Respiratory effort normal. Cardiovascular system: S1 & S2 heard, RRR. No JVD, murmurs, rubs, gallops or clicks. No pedal edema. Gastrointestinal system: Abdomen is nondistended, soft and nontender. No organomegaly or masses felt. Normal bowel sounds heard. Central nervous system: Alert and oriented x1. No focal neurological deficits. Extremities: Symmetric 5 x 5  power. Skin: No rashes, lesions or ulcers Psychiatry: Mood & affect appropriate.    Data Reviewed:  Lab results reviewed.  Family Communication: Family meeting as above.  Disposition: Status is: Inpatient Remains inpatient appropriate because: Severity of disease, IV treatment. Plan is discharge patient home with home care on Saturday.     Time spent: 55 minutes  Author: Marrion Coy, MD 03/30/2023 12:25 PM  For on call review [email or web address].

## 2023-03-30 NOTE — Progress Notes (Signed)
Occupational Therapy Treatment Patient Details Name: Jacqueline Tate MRN: [id number] DOB: 01-31-1931 Today's Date: 03/30/2023   History of present illness 89 y/o female presented to ED on 03/26/23 for generalized weakness, lethargy, bleeding from hemorrhoids and on lips, and fall. Admitted for sepsis 2/2 UTI. 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   OT comments  Pt received in chair, with one foot on ground and the other on leg rest. Appearing alert, but intermittently dosing off; willing to work with OT on grooming and t/f back to bed. T/f CGA with cues and RW to stand and walk a few feet to the bed. See flowsheet below for further details of session. Left semi-reclined in bed, bed alarm on, with all needs in reach.     Recommendations for follow up therapy are one component of a multi-disciplinary discharge planning process, led by the attending physician.  Recommendations may be updated based on patient status, additional functional criteria and insurance authorization.    Assistance Recommended at Discharge Frequent or constant Supervision/Assistance  Patient can return home with the following  A lot of help with walking and/or transfers;A lot of help with bathing/dressing/bathroom;Assistance with cooking/housework;Assistance with feeding;Direct supervision/assist for financial management;Direct supervision/assist for medications management;Assist for transportation;Help with stairs or ramp for entrance   Equipment Recommendations  None recommended by OT    Recommendations for Other Services      Precautions / Restrictions Precautions Precautions: Fall Precaution Comments: dementia Restrictions Weight Bearing Restrictions: No       Mobility Bed Mobility Overal bed mobility: Modified Independent Bed Mobility: Sit to Supine     Supine to sit: Independent     General bed mobility comments: cues to move BIL LE into middle of the bed     Transfers Overall transfer level: Needs assistance Equipment used: Rolling walker (2 wheels) Transfers: Sit to/from Stand Sit to Stand: Min guard           General transfer comment: cues for pushing from chair. Cues for hand placement on RW     Balance Overall balance assessment: Needs assistance, History of Falls Sitting-balance support: Feet supported, No upper extremity supported Sitting balance-Leahy Scale: Fair     Standing balance support: Bilateral upper extremity supported, Reliant on assistive device for balance Standing balance-Leahy Scale: Fair                             ADL either performed or assessed with clinical judgement   ADL Overall ADL's : Needs assistance/impaired     Grooming: Oral care;Set up;Minimal assistance;Sitting Grooming Details (indicate cue type and reason): seated in chair; pt needing cues for opening toothpaste; intermittently closing eyes and needing cues to attend to the task.                             Functional mobility during ADLs: Min guard;Rolling walker (2 wheels)      Extremity/Trunk Assessment Upper Extremity Assessment Upper Extremity Assessment: Overall WFL for tasks assessed   Lower Extremity Assessment Lower Extremity Assessment: Generalized weakness        Vision       Perception     Praxis      Cognition Arousal/Alertness: Awake/alert (although intermittently falling asleep during our conversation) Behavior During Therapy: WFL for tasks assessed/performed Overall Cognitive Status: History of cognitive impairments - at baseline  General Comments: follows commands; baseline dementia; pleasant and interactive today, although appears sleepy and intermittently needs cues to open eyes and listen to OT instructions. Very appreciative today.        Exercises      Shoulder Instructions       General Comments Transferred chair to bed  at end of session as pt appearing very fatigued. Pt found in chair at beginning of session with leg rest elevated and pt's R foot on floor.    Pertinent Vitals/ Pain       Pain Assessment Pain Assessment: No/denies pain  Home Living                                          Prior Functioning/Environment              Frequency  Min 2X/week        Progress Toward Goals  OT Goals(current goals can now be found in the care plan section)  Progress towards OT goals: Progressing toward goals  Acute Rehab OT Goals Patient Stated Goal: Get better OT Goal Formulation: Patient unable to participate in goal setting Time For Goal Achievement: 04/10/23 Potential to Achieve Goals: Fair ADL Goals Pt Will Perform Grooming: standing;with supervision Pt Will Perform Lower Body Dressing: sit to/from stand;with supervision Pt Will Transfer to Toilet: with modified independence;ambulating Pt Will Perform Toileting - Clothing Manipulation and hygiene: sit to/from stand;with supervision  Plan Discharge plan remains appropriate (with 24/7 supervision/assistance of capable caregiver)    Co-evaluation                 AM-PAC OT "6 Clicks" Daily Activity     Outcome Measure   Help from another person eating meals?: A Little Help from another person taking care of personal grooming?: A Little Help from another person toileting, which includes using toliet, bedpan, or urinal?: A Little Help from another person bathing (including washing, rinsing, drying)?: A Lot Help from another person to put on and taking off regular upper body clothing?: A Little Help from another person to put on and taking off regular lower body clothing?: A Lot 6 Click Score: 16    End of Session Equipment Utilized During Treatment: Gait belt;Rolling walker (2 wheels)  OT Visit Diagnosis: Unsteadiness on feet (R26.81);Muscle weakness (generalized) (M62.81);History of falling (Z91.81);Pain    Activity Tolerance Patient limited by lethargy;Patient limited by pain   Patient Left in bed;with bed alarm set;with call bell/phone within reach   Nurse Communication Mobility status;Other (comment) (pt need for purewick)        Time: 9604-5409 OT Time Calculation (min): 16 min  Charges: OT General Charges $OT Visit: 1 Visit OT Treatments $Self Care/Home Management : 8-22 mins  Linward Foster, MS, OTR/L   Alvester Morin 03/30/2023, 3:03 PM

## 2023-03-30 NOTE — Progress Notes (Signed)
[facility] Liaison Note  Notified by TOC/Stephanie of patient/family request of [facility] Paliative services.  [facility] hospital liaison will follow patient for discharge disposition.   Please call with any questions/concerns.    Thank you for the opportunity to participate in this patient's care.   Eugenie Birks, MSW [facility] Liaison

## 2023-03-30 NOTE — Care Management Important Message (Signed)
Important Message  Patient Details  Name: Jacqueline Tate MRN: [id number] Date of Birth: 09/11/1931   Medicare Important Message Given:  N/A - LOS <3 / Initial given by admissions     Johnell Comings 03/30/2023, 12:15 PM

## 2023-03-30 NOTE — Consult Note (Addendum)
Consultation Note Date: 03/30/2023 at 0900  Patient Name: Jacqueline Tate  DOB: Aug 26, 1931  MRN: [id number]  Age / Sex: 89 y.o., female  PCP: Kandyce Rud, MD Referring Physician: Marrion Coy, MD  Reason for Consultation: Establishing goals of care  HPI/Patient Profile: 89 y.o. female  with past medical history of Alzheimer's dementia, HTN, hypothyroidism, type 2 diabetes, CKD, and previous DVT/PE (Eliquis) admitted on 03/26/2023 with altered mental status and poor p.o. intake for 1 week.  Patient's blood cultures and urine culture tested positive for E. Coli. Patient is being treated with ceftriaxone.   Clinical Assessment and Goals of Care: I have reviewed medical records including EPIC notes, labs and imaging, assessed the patient and then met with patient's son [name] and daughter [name] at bedside to discuss diagnosis prognosis, GOC, EOL wishes, disposition and options.  Patient is awake and alert but unable to participate in goals of care medical decision making independently.  In the absence of an HCPOA and spouse, patient's next of kin decision maker would be majority of reasonably available adult children.  I introduced Palliative Medicine as specialized medical care for people living with serious illness. It focuses on providing relief from the symptoms and stress of a serious illness. The goal is to improve quality of life for both the patient and the family.  We discussed a brief life review of the patient.  Patient was married for 68 years and lost her husband approximately 6 weeks ago.  His death was unexpected.  Patient has 4 children, [name], Tillie Fantasia, and Janeal Holmes.  [name] and [name] are heavily involved in patient's day-to-day care.  As far as functional and nutritional status patient has become weaker and less mobile over the past 6 months.  Family endorses she has had multiple  UTIs over the past year due to dehydration.  They share they are constantly encouraging p.o. intake but that patient continually says she is not hungry or thirsty.  We discussed patient's current illness and what it means in the larger context of patient's on-going co-morbidities.  Natural disease trajectory discussed.  I attempted to elicit values and goals of care important to the patient.  Delrae Sawyers shared they want to be able to have the patient be at home because they know those are her wishes.  They want to make sure she is comfortable and able to be at home for as long as possible.  However, they are concerned that she is unable to be left alone at all anymore.  They are working to hire nursing care from 6 AM to 9 PM so that they can trade off and be with her in the evenings.  TOC is following closely for discharge planning.  The difference between aggressive medical intervention and comfort care was considered in light of the patient's goals of care.  Hospice and hospice services as well as outpatient palliative services discussed in detail. Family accepting of outpatient palliative referral at this time. TOC referral placed.   Dr. Chipper Herb joined conversation  and gave medical update.  Advance directives, concepts specific to code status, artificial feeding and hydration, and rehospitalization were considered and discussed.  Jonny Ruiz and [name] are in agreement they would want patient to be a DNR.  However, they need to consult with their other 2 siblings before a decision can be finalized.  Education offered regarding concept specific to human mortality and the limitations of medical interventions to prolong life when the body begins to fail to thrive.  Family is facing treatment option decisions, advanced directive, and anticipatory care needs.  Hard choices booklet and copy of MOST form given and reviewed with [name] and [name].    Discussed with patient/family the importance of  continued conversation with family and the medical providers regarding overall plan of care and treatment options, ensuring decisions are within the context of the patient's values and GOCs.    After my discussion with [name] and [name], I attempted to speak to patient's other 2 children over the phone.  Call placed to [facility] and Janeal Holmes.  No answer from either person.  HIPAA compliant voicemail left for both.  PMT will continue to follow and support patient and family throughout her hospitalization.  Addendum:  Received return call from son Chanetta Marshall. WE discussed patient's current medical condition, functironal status, dementia, advanced care planning, and code status. Chanetta Marshall would like time to speak with his siblings in regards to changes in POC. PMT contact info given and Jimmy encouraged to call with any f/u questions/concerns.   Primary Decision Maker NEXT OF KIN  Physical Exam Vitals reviewed.  Constitutional:      General: She is not in acute distress.    Appearance: She is normal weight. She is not ill-appearing.  HENT:     Head: Normocephalic.     Mouth/Throat:     Mouth: Mucous membranes are moist.  Eyes:     Pupils: Pupils are equal, round, and reactive to light.  Cardiovascular:     Rate and Rhythm: Normal rate.     Pulses: Normal pulses.  Pulmonary:     Effort: Pulmonary effort is normal.  Abdominal:     Palpations: Abdomen is soft.  Musculoskeletal:        General: Normal range of motion.  Skin:    General: Skin is warm and dry.  Neurological:     Mental Status: She is alert.     Comments: Oriented to self  Psychiatric:        Mood and Affect: Mood normal.        Behavior: Behavior normal.        Judgment: Judgment normal.     Palliative Assessment/Data: 50%     Thank you for this consult. Palliative medicine will continue to follow and assist holistically.   Time Total: 75 minutes Greater than 50%  of this time was spent counseling and coordinating  care related to the above assessment and plan.  Signed by: Georgiann Cocker, DNP, FNP-BC Palliative Medicine    Please contact Palliative Medicine Team phone at [phone] for questions and concerns.  For individual provider: See Loretha Stapler

## 2023-03-30 NOTE — Progress Notes (Signed)
Physical Therapy Treatment Patient Details Name: Jacqueline Tate MRN: [id number] DOB: October 05, 1931 Today's Date: 03/30/2023   History of Present Illness 89 y/o female presented to ED on 03/26/23 for generalized weakness, lethargy, bleeding from hemorrhoids and on lips, and fall. Admitted for sepsis 2/2 UTI. 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    PT Comments    Pt alert and able to participate this date. Pt agreeable to OOB mobility and monitored O2 sats and HR. Sats at 90% and HR at 111bpm. Pt remains confused and remained in chair at end of session. Will continue to progress as able.   Recommendations for follow up therapy are one component of a multi-disciplinary discharge planning process, led by the attending physician.  Recommendations may be updated based on patient status, additional functional criteria and insurance authorization.  Follow Up Recommendations       Assistance Recommended at Discharge Frequent or constant Supervision/Assistance  Patient can return home with the following A lot of help with walking and/or transfers;A little help with bathing/dressing/bathroom;Assistance with cooking/housework;Direct supervision/assist for medications management;Direct supervision/assist for financial management;Assist for transportation;Help with stairs or ramp for entrance   Equipment Recommendations  Rolling walker (2 wheels)    Recommendations for Other Services       Precautions / Restrictions Precautions Precautions: Fall Precaution Comments: dementia, sitter present today Restrictions Weight Bearing Restrictions: No     Mobility  Bed Mobility Overal bed mobility: Needs Assistance Bed Mobility: Supine to Sit     Supine to sit: Min assist     General bed mobility comments: needs cues for B LE and scooting out towards EOB.    Transfers Overall transfer level: Needs assistance Equipment used: Rolling walker (2  wheels) Transfers: Sit to/from Stand Sit to Stand: Min assist           General transfer comment: cues for pushing from seated surface. Once standing, able to hold onto RW.    Ambulation/Gait Ambulation/Gait assistance: Min assist Gait Distance (Feet): 5 Feet Assistive device: Rolling walker (2 wheels) Gait Pattern/deviations: Step-to pattern       General Gait Details: able to ambulate from bed->BSC->recliner. Needs step by step for task.   Stairs             Wheelchair Mobility    Modified Rankin (Stroke Patients Only)       Balance Overall balance assessment: Needs assistance, History of Falls Sitting-balance support: Feet supported, No upper extremity supported Sitting balance-Leahy Scale: Fair     Standing balance support: Bilateral upper extremity supported Standing balance-Leahy Scale: Fair                              Cognition Arousal/Alertness: Awake/alert Behavior During Therapy: WFL for tasks assessed/performed Overall Cognitive Status: History of cognitive impairments - at baseline                                 General Comments: follows commands, however confused        Exercises Other Exercises Other Exercises: pt ambulated to [facility] and able to void. When given toilet paper, able to perform self hygiene. Needs physical assist for transfers    General Comments        Pertinent Vitals/Pain Pain Assessment Pain Assessment: No/denies pain    Home Living  Prior Function            PT Goals (current goals can now be found in the care plan section) Acute Rehab PT Goals Patient Stated Goal: patient unable to state PT Goal Formulation: Patient unable to participate in goal setting Time For Goal Achievement: 04/10/23 Potential to Achieve Goals: Fair Progress towards PT goals: Progressing toward goals    Frequency    Min 3X/week      PT Plan Current plan remains  appropriate    Co-evaluation              AM-PAC PT "6 Clicks" Mobility   Outcome Measure  Help needed turning from your back to your side while in a flat bed without using bedrails?: A Little Help needed moving from lying on your back to sitting on the side of a flat bed without using bedrails?: A Little Help needed moving to and from a bed to a chair (including a wheelchair)?: A Little Help needed standing up from a chair using your arms (e.g., wheelchair or bedside chair)?: A Little Help needed to walk in hospital room?: A Little Help needed climbing 3-5 steps with a railing? : A Lot 6 Click Score: 17    End of Session Equipment Utilized During Treatment: Gait belt Activity Tolerance: Patient tolerated treatment well Patient left: in chair;with nursing/sitter in room Nurse Communication: Mobility status PT Visit Diagnosis: Unsteadiness on feet (R26.81);Muscle weakness (generalized) (M62.81);History of falling (Z91.81);Difficulty in walking, not elsewhere classified (R26.2)     Time: 5784-6962 PT Time Calculation (min) (ACUTE ONLY): 16 min  Charges:  $Therapeutic Activity: 8-22 mins                     Elizabeth Palau, PT, DPT, GCS [phone]    Jacqueline Tate 03/30/2023, 12:00 PM

## 2023-03-30 NOTE — Progress Notes (Signed)
Speech Language Pathology Treatment: Dysphagia  Patient Details Name: Jacqueline Tate MRN: [id number] DOB: [birth year] Today's Date: 03/30/2023 Time: 1010-1040 SLP Time Calculation (min) (ACUTE ONLY): 30 min  Assessment / Plan / Recommendation Clinical Impression  Pt seen for for ongoing assessment of swallowing today; toleration of diet and trials of solid foods to upgrade diet. Pt sitting in chair post PT session earlier; Sitter present in room. Pt has Baseline Dementia. She appeared to be resting in chair; easily alerted to verbal/tactile stim but often closed eyes b/t trials t/o evaluation and needed cues to open eyes to attend. Pt was verbal and answered basic questions re: self; followed basic commands w/ cues.  On RA, afebrile. WBC WNL.    Pt appears to present w/ grossly functional oropharyngeal phase swallowing in setting of declined Cognitive status; Baseline Dementia. Slight-min slower mastication of solids during the oral phase w/ solid foods was noted, but overall functional. Suspect impact of Cognitive decline in setting of acute illness. ANY Cognitive decline can impact her overall awareness/timing of bolus management and swallow and safety during po tasks which increases risk for aspiration, choking.  Pt's risk for aspiration can be reduced when following general aspiration precautions and using a slighlty modified diet consistency of cut, moistened foods at this time. Pt does wear Dentures and requires support w/ placement for meals. She also requires min-mod verbal/visual/tactile cues for follow through during po tasks and self-feeding d/t the Cognitive decline.       Pt consumed trials of moistened solids and thin liquids via Straw w/ No overt clinical s/s of aspiration noted: no decline in vocal quality; no cough, and no decline in respiratory status during/post trials. Oral phase was functional for bolus management and mastication; oral clearing of the solid boluses was completed  using lingual sweep and alternating food/liquid -- pt stated the cookie was "a little dry" even w/ ice cream on it. Mastication was slightly increased in Time but overall functional. Moistening the foods would be strongly recommended.  Pt self-fed but required mod support w/ setup and verbal cues d/t the Cognitive decline, closed eyes. She was able to hold the Cup to drink which improves safety of swallowing.  Verba and visual cues were helpful to initiate po tasks, and denture placement.         In setting of baseline Dementia and Cognitive decline in setting of acute illness, recommend a dysphagia level 3(cut meats/foods moistened for ease of mastication) w/ thin liquids via Cup/Straw; general aspiration precautions; reduce Distractions during meals and engage pt during meals for self-feeding. Pills Whole in Puree for safer swallowing as needed. Support w/ feeding/verbal at meals as needed d/t the Cognitive decline. MD/NSG updated.   No further skilled ST services recommended at this time. MD to reconsult if any new needs while admitted. Recommend follow w/ Palliative Care for GOC and education re: impact of Cognitive decline/Dementia on swallowing and oral intake in general. Precautions posted in room.      HPI HPI: Pt is a 89 y/o female presented to ED on 03/26/23 for generalized weakness, lethargy, bleeding from hemorrhoids and on lips, and fall. Admitted for sepsis 2/2 UTI.  Per family report, pt has been refusing to eat and drink but was able to eat some food approximately 2 days ago and did have some ppropel this morning after coaxing.  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      SLP Plan  All goals  met      Recommendations for follow up therapy are one component of a multi-disciplinary discharge planning process, led by the attending physician.  Recommendations may be updated based on patient status, additional functional criteria and insurance  authorization.    Recommendations  Diet recommendations: Dysphagia 3 (mechanical soft);Thin liquid (for cut meats, moistened foods) Liquids provided via: Cup;Straw Medication Administration: Whole meds with puree (vs need to Crush in puree for safer swallowing) Supervision: Patient able to self feed;Intermittent supervision to cue for compensatory strategies Compensations: Slow rate;Minimize environmental distractions;Small sips/bites;Lingual sweep for clearance of pocketing;Follow solids with liquid Postural Changes and/or Swallow Maneuvers: Out of bed for meals;Seated upright 90 degrees;Upright 30-60 min after meal                 (Dietician f/u - pt only requested "Jacqueline Tate" for oral intake) Oral care BID;Oral care before and after PO;Staff/trained caregiver to provide oral care (Denture care)   Frequent or constant Supervision/Assistance (d/t Dementia) Dysphagia, unspecified (R13.10) (baseline Dementia)     All goals met       Jacqueline Som, MS, CCC-SLP Speech Language Pathologist Rehab Services; [facility] [place] [phone] (ascom) Jacqueline Tate  03/30/2023, 10:47 AM

## 2023-03-31 DIAGNOSIS — G9340 Encephalopathy, unspecified: Secondary | ICD-10-CM | POA: Diagnosis not present

## 2023-03-31 DIAGNOSIS — A4151 Sepsis due to Escherichia coli [E. coli]: Secondary | ICD-10-CM | POA: Diagnosis not present

## 2023-03-31 DIAGNOSIS — A419 Sepsis, unspecified organism: Secondary | ICD-10-CM | POA: Diagnosis not present

## 2023-03-31 DIAGNOSIS — N390 Urinary tract infection, site not specified: Secondary | ICD-10-CM | POA: Diagnosis not present

## 2023-03-31 LAB — CBC
HCT: 28.5 % — ABNORMAL LOW (ref 36.0–46.0)
Hemoglobin: 9.6 g/dL — ABNORMAL LOW (ref 12.0–15.0)
MCH: 32.2 pg (ref 26.0–34.0)
MCHC: 33.7 g/dL (ref 30.0–36.0)
MCV: 95.6 fL (ref 80.0–100.0)
Platelets: 140 10*3/uL — ABNORMAL LOW (ref 150–400)
RBC: 2.98 MIL/uL — ABNORMAL LOW (ref 3.87–5.11)
RDW: 16.4 % — ABNORMAL HIGH (ref 11.5–15.5)
WBC: 4.4 10*3/uL (ref 4.0–10.5)
nRBC: 0 % (ref 0.0–0.2)

## 2023-03-31 LAB — BASIC METABOLIC PANEL
Anion gap: 8 (ref 5–15)
BUN: 20 mg/dL (ref 8–23)
CO2: 22 mmol/L (ref 22–32)
Calcium: 8.5 mg/dL — ABNORMAL LOW (ref 8.9–10.3)
Chloride: 105 mmol/L (ref 98–111)
Creatinine, Ser: 0.84 mg/dL (ref 0.44–1.00)
GFR, Estimated: >60 mL/min (ref >60)
Glucose, Bld: 115 mg/dL — ABNORMAL HIGH (ref 70–99)
Potassium: 3.7 mmol/L (ref 3.5–5.1)
Sodium: 135 mmol/L (ref 135–145)

## 2023-03-31 LAB — GLUCOSE, CAPILLARY
Glucose-Capillary: 122 mg/dL — ABNORMAL HIGH (ref 70–99)
Glucose-Capillary: 87 mg/dL (ref 70–99)
Glucose-Capillary: 92 mg/dL (ref 70–99)

## 2023-03-31 NOTE — Progress Notes (Signed)
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  Patient coming in with altered mental status Blood cultures and urine culture are positive for E. coli.  Patient was treated with ceftriaxone.    Principal Problem:   Severe sepsis Active Problems:   Acute encephalopathy   AKI (acute kidney injury)   Macrocytic anemia   Bright red blood per rectum   Leukocytosis   Alzheimer's type dementia with late onset without behavioral disturbance   Hypertension   Hypothyroid   Type 2 diabetes mellitus without complication, without long-term current use of insulin   Hypokalemia   Thrombocytopenia   Hyponatremia   Alzheimer's dementia with behavioral disturbance   Failure to thrive in adult   E. coli septicemia   E. coli UTI   Iron deficiency anemia   Metabolic acidosis   Assessment and Plan:  Severe sepsis secondary to urinary tract infection. E. coli septicemia secondary to UTI. E. coli urinary tract infection. Patient met severe sepsis criteria at time of admission with tachycardia, tachypnea and leukocytosis, acute kidney injury and altered mental status. This is secondary to UTI secondary to E. coli infection. Repeat blood cultures so far negative.  Final culture results showed a pansensitive E. coli.  Will change to Keflex at time of discharge.   Patient is doing better, will continue 1 more day IV antibiotics.   Acute encephalopathy Alzheimer's dementia with behavioral disturbance. Patient chronically taking Seroquel 75 mg every evening, she still could not sleep last night, she was agitated.  Added melatonin to the regimen. The new regimen worked better, will prescribe at time of discharge.    Failure to thrive. Anorexia. Had a family meeting with the son and the daughter, patient has not been eating at home, she lost 9 pounds of body weight within a month.  Long-term prognosis is poor, meeting with palliative care present, decision was made to continue palliative care at home, set up home care for now.  If condition deteriorates, patient can be transferred to hospice.  We also talk about DNR status, family need to talk among themselves to make a decision. Palliative care will continue to follow patient after discharge, may transition to hospice if condition is getting worse.  AKI (acute kidney injury) Hyponatremia. Hypokalemia. Mild metabolic acidosis. Condition improving, recheck a BMP tomorrow.   Bright red blood per rectum Iron deficient anemia. Rectal bleeding appears to be secondary to hemorrhoids, no additional bleeding.  Patient has significant iron deficiency, started on iron supplement.  B12 level normal.   Hypertension Continue metoprolol.  Added Norvasc.   Hypothyroid Continue Synthroid   Type 2 diabetes mellitus without complication, without long-term current use of insulin Continue sliding scale insulin.     Thrombocytopenia Secondary to sepsis.  Condition improving.      Subjective:  Patient slept well last night, she has no complaint today.  Physical Exam: Vitals:   03/30/23 1636 03/30/23 2312 03/31/23 0500 03/31/23 0907  BP: (!) 150/67 138/76 127/71 (!) 161/77  Pulse: 76 73 77 61  Resp: Temp: 97.6 F (36.4 C) 97.8 F (36.6 C) 98 F (36.7 C) 98.3 F (36.8 C)  TempSrc: Oral Oral Oral Oral  SpO2: 97% 96% 98% 90%  Weight:   66.6 kg   Height:       General exam: Appears calm and comfortable  Respiratory system: Clear to auscultation. Respiratory effort normal. Cardiovascular system: S1 & S2 heard, RRR. No JVD, murmurs, rubs, gallops or clicks. No pedal edema. Gastrointestinal system: Abdomen is nondistended, soft and nontender.  No organomegaly or masses felt. Normal bowel sounds heard. Central nervous system: Alert and oriented x1. No focal neurological deficits. Extremities: Symmetric 5 x 5 power. Skin: No rashes, lesions or ulcers Psychiatry: Judgement and insight appear normal. Mood & affect appropriate.    Data Reviewed:  Lab results reviewed.  Family Communication: Son and daughter updated.  Disposition: Status is: Inpatient Remains inpatient appropriate because: Severity of disease, IV treatment. Patient need 24-hour supervision, but family wished to take patient home with Plliative care.     Time spent: 35 minutes  Author: Marrion Coy, MD 03/31/2023 12:02 PM  For on call review [email or web address].

## 2023-03-31 NOTE — Progress Notes (Signed)
Mobility Specialist - Progress Note     03/31/23 1637  Mobility  Activity Ambulated with assistance to bathroom;Stood at bedside;Transferred from bed to chair  Level of Assistance Independent after set-up  Assistive Device None  Distance Ambulated (ft) 10 ft  Range of Motion/Exercises Active  Activity Response Tolerated well  Mobility Referral Yes  $Mobility charge 1 Mobility   Author responded to bed alarm patient OOB upon arrival. Pt very pleasant and requested to go to the bathroom. Pt ambulates to bathroom Indep with SBA for safety due to small wobbles. Pt returned to recliner and left with needs in reach and chair alarm activated.   Johnathan Hausen Mobility Specialist 03/31/23, 4:54 PM

## 2023-03-31 NOTE — TOC Progression Note (Signed)
Transition of Care [facility]) - Progression Note    Patient Details  Name: Jacqueline Tate MRN: [id number] Date of Birth: August 09, 1931  [facility] CM/SW Contact  Chapman Fitch, RN Phone Number: 03/31/2023, 9:54 AM  Clinical Narrative:     Per MD plan for dc tomorrow Per son [name] he will transport tomorrow  Update Barbara Cower with [facility] Per [name] they are still working on arranging 24/7 care, but someone will be available to stay with her through the weekend         Expected Discharge Plan and Services                                               Social Determinants of Health (SDOH) Interventions SDOH Screenings   Food Insecurity: Patient Declined (03/27/2023)  Transportation Needs: Patient Declined (03/27/2023)  Utilities: Patient Declined (03/27/2023)  Tobacco Use: Low Risk  (03/26/2023)    Readmission Risk Interventions     No data to display

## 2023-03-31 NOTE — Progress Notes (Signed)
Mobility Specialist - Progress Note   03/31/23 1552  Mobility  Activity Ambulated with assistance in hallway  Level of Assistance Standby assist, set-up cues, supervision of patient - no hands on  Assistive Device Front wheel walker  Distance Ambulated (ft) 560 ft  Activity Response Tolerated well  $Mobility charge 1 Mobility     Pt ambulated in hallway with minG. Author does note pt has difficulty seeing at times. "I'm just trying to stay out of this lady's way" pt states referring to dinamap left in middle of hallway, "excuse me ma'am" as we walk past dinamap. Author pointed out several items for patient to identify during ambulation. Pt identifies objects correctly but unable to answer until close up on items. Pt returned to bed with assist on LE to return supine. Fatigued post-session. Pt left in bed with alarm set, needs in reach. RN notified.    Filiberto Pinks Mobility Specialist 03/31/23, 3:57 PM

## 2023-03-31 NOTE — Discharge Instructions (Signed)
Patient is not allowed to drive. She needs 24-hour supervision.

## 2023-03-31 NOTE — Progress Notes (Signed)
Mobility Specialist - Progress Note   03/31/23 1500  Mobility  Activity Ambulated with assistance in room;Transferred to/from [facility]  Level of Assistance Contact guard assist, steadying assist  Assistive Device Front wheel walker;None  Distance Ambulated (ft) 10 ft  Activity Response Tolerated well  $Mobility charge 1 Mobility     Pt attempting to get out of bed on arrival, utilizing RA. Pt pleasantly confused, she has foot stretched out under table trying to pick up a "quarter", but the quarter she's referring to is just a spot on the floor. "I was going to give you this quarter but I guess not. I'm just going to the bathroom and then I'm going home. I just got to find a ride." Redirection. Pt STS with minG. RW in place for pt to use but she pushes it aside requiring verbal/tactile cues for hand placement. Pt ambulated to [facility] with RW then pushes it aside again. Supervision for seated peri-care. Pt ambulated back to bed with CGA and no RW. Directional cueing to prevent tangling of lines/tubes. Decreased safety awareness. Pt left in bed with alarm set, needs in reach.    Filiberto Pinks Mobility Specialist 03/31/23, 3:19 PM

## 2023-04-01 DIAGNOSIS — R6520 Severe sepsis without septic shock: Secondary | ICD-10-CM | POA: Diagnosis not present

## 2023-04-01 DIAGNOSIS — A419 Sepsis, unspecified organism: Secondary | ICD-10-CM | POA: Diagnosis not present

## 2023-04-01 DIAGNOSIS — G9340 Encephalopathy, unspecified: Secondary | ICD-10-CM | POA: Diagnosis not present

## 2023-04-01 DIAGNOSIS — A4151 Sepsis due to Escherichia coli [E. coli]: Secondary | ICD-10-CM | POA: Diagnosis not present

## 2023-04-01 LAB — CBC
HCT: 29.8 % — ABNORMAL LOW (ref 36.0–46.0)
Hemoglobin: 10 g/dL — ABNORMAL LOW (ref 12.0–15.0)
MCH: 32.5 pg (ref 26.0–34.0)
MCHC: 33.6 g/dL (ref 30.0–36.0)
MCV: 96.8 fL (ref 80.0–100.0)
Platelets: 160 10*3/uL (ref 150–400)
RBC: 3.08 MIL/uL — ABNORMAL LOW (ref 3.87–5.11)
RDW: 16.4 % — ABNORMAL HIGH (ref 11.5–15.5)
WBC: 4 10*3/uL (ref 4.0–10.5)
nRBC: 0 % (ref 0.0–0.2)

## 2023-04-01 LAB — MAGNESIUM: Magnesium: 1.7 mg/dL (ref 1.7–2.4)

## 2023-04-01 LAB — BASIC METABOLIC PANEL
Anion gap: 6 (ref 5–15)
BUN: 14 mg/dL (ref 8–23)
CO2: 25 mmol/L (ref 22–32)
Calcium: 8.7 mg/dL — ABNORMAL LOW (ref 8.9–10.3)
Chloride: 105 mmol/L (ref 98–111)
Creatinine, Ser: 0.75 mg/dL (ref 0.44–1.00)
GFR, Estimated: >60 mL/min (ref >60)
Glucose, Bld: 101 mg/dL — ABNORMAL HIGH (ref 70–99)
Potassium: 4 mmol/L (ref 3.5–5.1)
Sodium: 136 mmol/L (ref 135–145)

## 2023-04-01 LAB — CULTURE, BLOOD (ROUTINE X 2): Culture: NO GROWTH

## 2023-04-01 LAB — GLUCOSE, CAPILLARY
Glucose-Capillary: 89 mg/dL (ref 70–99)
Glucose-Capillary: 117 mg/dL — ABNORMAL HIGH (ref 70–99)
Glucose-Capillary: 85 mg/dL (ref 70–99)
Glucose-Capillary: 107 mg/dL — ABNORMAL HIGH (ref 70–99)

## 2023-04-01 MED ORDER — SODIUM CHLORIDE 0.9 % IV SOLN
2.0000 g | Freq: Once | INTRAVENOUS | Status: AC
  Administered 2023-04-01: 2 g via INTRAVENOUS
  Filled 2023-04-01: qty 2

## 2023-04-01 MED ORDER — SODIUM CHLORIDE 0.9 % IV SOLN
2.0000 g | Freq: Once | INTRAVENOUS | Status: DC
  Filled 2023-04-01: qty 20

## 2023-04-01 MED ORDER — CEPHALEXIN 500 MG PO CAPS: 1000.0000 mg | ORAL_CAPSULE | Freq: Three times a day (TID) | ORAL | 0 refills | Status: AC

## 2023-04-01 NOTE — Discharge Summary (Addendum)
Physician Discharge Summary   Patient: Jacqueline Tate MRN: [id number] DOB: [birth year]  Admit date:     03/26/2023  Discharge date: 04/01/23  Discharge Physician: Marrion Coy   PCP: Kandyce Rud, MD   Recommendations at discharge:   Follow-up with PCP in 1 week. Follow-up with palliative care in 1 week.  Discharge Diagnoses: Principal Problem:   Severe sepsis Active Problems:   Acute encephalopathy   AKI (acute kidney injury)   Macrocytic anemia   Bright red blood per rectum   Leukocytosis   Alzheimer's type dementia with late onset without behavioral disturbance   Hypertension   Hypothyroid   Type 2 diabetes mellitus without complication, without long-term current use of insulin   Hypokalemia   Thrombocytopenia   Hyponatremia   Alzheimer's dementia with behavioral disturbance   Failure to thrive in adult   E. coli septicemia   E. coli UTI   Iron deficiency anemia   Metabolic acidosis  Resolved Problems:   * No resolved hospital problems. 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  Patient coming in with altered mental status Blood cultures and urine culture are positive for E. coli.  Patient was treated with ceftriaxone.   Assessment and Plan: Severe sepsis secondary to urinary tract infection. E. coli septicemia secondary to UTI. E. coli urinary tract infection. Patient met severe sepsis criteria at time of admission with tachycardia, tachypnea and leukocytosis, acute kidney injury and altered mental status. This is secondary to UTI secondary to E. coli infection. Repeat blood cultures so far negative.  Final culture results showed a pansensitive E. coli.  Will change to Keflex at time of discharge.   Patient condition improved, will give 2 g of Rocephin today before discharge, followed with Keflex 1000 mg every 8 hours  for additional 8 days.   Acute encephalopathy Alzheimer's dementia with behavioral disturbance. Patient chronically taking Seroquel 75 mg every evening, she still could not sleep last night, she was agitated.  Added melatonin to the regimen. Patient no longer has any agitation.  Resume home regimen at discharge.   Failure to thrive. Anorexia. Had a family meeting with the son and the daughter, patient has not been eating at home, she lost 9 pounds of body weight within a month.  Long-term prognosis is poor, meeting with palliative care present, decision was made to continue palliative care at home, set up home care for now.  If condition deteriorates, patient can be transferred to hospice.  We also talk about DNR status, family need to talk among themselves to make a decision. Palliative care will continue to follow patient after discharge, may transition to hospice if condition is getting worse. Due to poor prognosis, home medication has been simplified.   AKI (acute kidney injury) Hyponatremia. Hypokalemia. Mild metabolic acidosis. Conditions are improved.   Bright red blood per rectum Iron deficient anemia. Rectal bleeding appears to be secondary to hemorrhoids, no additional bleeding.  Patient has significant iron deficiency, started on iron supplement.  B12 level normal.   Hypertension Resume home regimen.   Hypothyroid Continue Synthroid   Type 2 diabetes mellitus without complication, without long-term current use of insulin Follow-up with PCP as outpatient.     Patient oxygen saturations dropped to 86% on room air with walking, family does not feel comfortable to take her home.  Will keep her another day and discharge tomorrow.  Consultants: None  Procedures performed: None  Disposition: Home health Diet recommendation:  Discharge Diet Orders (From admission, onward)     Start     Ordered   04/01/23 0000  Diet general       Comments: Dys 3 diet   04/01/23 1058            Dysphagia type 3 thin Liquid DISCHARGE MEDICATION: Allergies as of 04/01/2023       Reactions   Codeine Nausea And Vomiting   Latex    Simvastatin Other (See Comments)   Other reaction(s): Muscle Pain Weakness to leg muscles & weakness Weakness to leg muscles   Penicillins Rash   Tolerated ceftriaxone 03/2023 Has patient had a PCN reaction causing immediate rash, facial/tongue/throat swelling, SOB or lightheadedness with hypotension: Yes Has patient had a PCN reaction causing severe rash involving mucus membranes or skin necrosis: No Has patient had a PCN reaction that required hospitalization No Has patient had a PCN reaction occurring within the last 10 years: Yes If all of the above answers are "NO", then may proceed with Cephalosporin use.   Sulfa Antibiotics Rash        Medication List     STOP taking these medications    cyclobenzaprine 5 MG tablet Commonly known as: FLEXERIL   gabapentin 600 MG tablet Commonly known as: NEURONTIN   loratadine 10 MG tablet Commonly known as: CLARITIN   metoCLOPramide 10 MG tablet Commonly known as: REGLAN   omeprazole 20 MG capsule Commonly known as: Teacher, music injection Generic drug: COVID-19 mRNA bivalent vaccine Proofreader)   PHILLIPS [facility] PO       TAKE these medications    acetaminophen 500 MG tablet Commonly known as: TYLENOL Take 500 mg by mouth 6 (six) times daily.   bisacodyl 5 MG EC tablet Commonly known as: DULCOLAX Take 5 mg by mouth daily as needed for moderate constipation.   cephALEXin 500 MG capsule Commonly known as: KEFLEX Take 2 capsules (1,000 mg total) by mouth every 8 (eight) hours for 8 days.   docusate sodium 100 MG capsule Commonly known as: COLACE Take 100 mg by mouth daily as needed for moderate constipation or mild constipation.   DULoxetine 60 MG capsule Commonly known as: CYMBALTA Take 1 capsule by mouth daily.   Eliquis 5 MG Tabs  tablet Generic drug: apixaban Take 5 mg by mouth 2 (two) times daily.   fluticasone 50 MCG/ACT nasal spray Commonly known as: FLONASE Place 1 spray into both nostrils daily as needed.   levothyroxine 50 MCG tablet Commonly known as: SYNTHROID Take 1 tablet by mouth daily.   lisinopril 20 MG tablet Commonly known as: ZESTRIL Take 1 tablet by mouth daily.   metoprolol tartrate 50 MG tablet Commonly known as: LOPRESSOR Take 1 tablet by mouth 2 (two) times daily.   Polyethyl Glycol-Propyl Glycol 0.4-0.3 % Soln Apply to eye 2 (two) times daily.   QUEtiapine 25 MG tablet Commonly known as: SEROQUEL Take 25 mg by mouth at bedtime.   QUEtiapine 50 MG tablet Commonly known as: SEROQUEL Take 50 mg by mouth at bedtime.        Follow-up Information     Kandyce Rud, MD Follow up in 1 week(s).   Specialty: Family Medicine Contact information: [address] [facility] [place] [address] [phone]                Discharge Exam: Ceasar Mons  Weights   03/27/23 0453 03/29/23 0402 03/31/23 0500  Weight: 68 kg 66.6 kg 66.6 kg   General exam: Appears calm and comfortable  Respiratory system: Clear to auscultation. Respiratory effort normal. Cardiovascular system: S1 & S2 heard, RRR. No JVD, murmurs, rubs, gallops or clicks. No pedal edema. Gastrointestinal system: Abdomen is nondistended, soft and nontender. No organomegaly or masses felt. Normal bowel sounds heard. Central nervous system: Alert and oriented x2. No focal neurological deficits. Extremities: Symmetric 5 x 5 power. Skin: No rashes, lesions or ulcers Psychiatry: Judgement and insight appear normal. Mood & affect appropriate.    Condition at discharge: fair  The results of significant diagnostics from this hospitalization (including imaging, microbiology, ancillary and laboratory) are listed below for reference.   Imaging Studies: US RENAL  Result Date:  04-20-2023 CLINICAL DATA:  [id number] AKI (acute kidney injury) [id number] EXAM: RENAL / URINARY TRACT ULTRASOUND COMPLETE COMPARISON:  None Available. FINDINGS: Right Kidney: Renal measurements: 10.4 x 4.8 x 3.6 cm = volume: 93.6 mL. Echogenicity within normal limits. Fullness of the right collecting system. No mass. No frank hydronephrosis visualized. Left Kidney: Renal measurements: 9.6 x 5.7 x 4.8 cm = volume: 137 mL. Echogenicity within normal limits. No mass or hydronephrosis visualized. Urinary bladder: Distended with urine. Debris/sediment noted throughout the urinary bladder lumen. Other: None. IMPRESSION: 1. Fullness of the right collecting system likely due to urinary bladder filled with urine. Unable to make patient void as patient altered. 2. Debris throughout the urinary bladder lumen. Finding may represent sediment versus infection. Correlate with urinalysis. Electronically Signed   By: Tish Frederickson M.D.   On: 04/20/23 18:56   CT Head Wo Contrast  Result Date: Apr 20, 2023 CLINICAL DATA:  Mental status change, unknown cause EXAM: CT HEAD WITHOUT CONTRAST TECHNIQUE: Contiguous axial images were obtained from the base of the skull through the vertex without intravenous contrast. RADIATION DOSE REDUCTION: This exam was performed according to the departmental dose-optimization program which includes automated exposure control, adjustment of the mA and/or kV according to patient size and/or use of iterative reconstruction technique. COMPARISON:  CT head 11/29/2022. FINDINGS: Brain: No evidence of acute infarction, hemorrhage, hydrocephalus, extra-axial collection or mass lesion/mass effect. Remote right PCA territory infarct. Vascular: Calcific atherosclerosis. No hyperdense vessel identified. Skull: No acute fracture. Sinuses/Orbits: Right maxillary sinus mucosal thickening. No acute orbital findings. Other: No mastoid effusions. IMPRESSION: 1. No evidence of acute abnormality. 2. Remote right PCA  territory infarct. Electronically Signed   By: Feliberto Harts M.D.   On: Apr 20, 2023 13:43   DG Chest Portable 1 View  Result Date: 04-20-23 CLINICAL DATA:  Weakness, cough. EXAM: PORTABLE CHEST 1 VIEW COMPARISON:  Chest x-ray dated 11/29/2022 FINDINGS: Heart size and mediastinal contours are grossly stable. Lungs are clear. No pleural effusion or pneumothorax is seen. Osseous structures about the chest are unremarkable. IMPRESSION: No active disease. No evidence of pneumonia or pulmonary edema. Electronically Signed   By: Bary Richard M.D.   On: 04-20-2023 12:57    Microbiology: Results for orders placed or performed during the hospital encounter of 2023/04/20  Resp panel by RT-PCR (RSV, Flu A&B, Covid) Anterior Nasal Swab     Status: None   Collection Time: Apr 20, 2023  1:02 PM   Specimen: Anterior Nasal Swab  Result Value Ref Range Status   SARS Coronavirus 2 by RT PCR NEGATIVE NEGATIVE Final    Comment: (NOTE) SARS-CoV-2 target nucleic acids are NOT DETECTED.  The SARS-CoV-2 RNA is generally detectable in upper respiratory specimens  during the acute phase of infection. The lowest concentration of SARS-CoV-2 viral copies this assay can detect is 138 copies/mL. A negative result does not preclude SARS-Cov-2 infection and should not be used as the sole basis for treatment or other patient management decisions. A negative result may occur with  improper specimen collection/handling, submission of specimen other than nasopharyngeal swab, presence of viral mutation(s) within the areas targeted by this assay, and inadequate number of viral copies(<138 copies/mL). A negative result must be combined with clinical observations, patient history, and epidemiological information. The expected result is Negative.  Fact Sheet for Patients:  BloggerCourse.com  Fact Sheet for Healthcare Providers:  SeriousBroker.it  This test is no t yet approved  or cleared by the [place] FDA and  has been authorized for detection and/or diagnosis of SARS-CoV-2 by FDA under an Emergency Use Authorization (EUA). This EUA will remain  in effect (meaning this test can be used) for the duration of the COVID-19 declaration under Section 564(b)(1) of the Act, 21 U.S.C.section 360bbb-3(b)(1), unless the authorization is terminated  or revoked sooner.       Influenza A by PCR NEGATIVE NEGATIVE Final   Influenza B by PCR NEGATIVE NEGATIVE Final    Comment: (NOTE) The Xpert Xpress SARS-CoV-2/FLU/RSV plus assay is intended as an aid in the diagnosis of influenza from Nasopharyngeal swab specimens and should not be used as a sole basis for treatment. Nasal washings and aspirates are unacceptable for Xpert Xpress SARS-CoV-2/FLU/RSV testing.  Fact Sheet for Patients: BloggerCourse.com  Fact Sheet for Healthcare Providers: SeriousBroker.it  This test is not yet approved or cleared by the [place] FDA and has been authorized for detection and/or diagnosis of SARS-CoV-2 by FDA under an Emergency Use Authorization (EUA). This EUA will remain in effect (meaning this test can be used) for the duration of the COVID-19 declaration under Section 564(b)(1) of the Act, 21 U.S.C. section 360bbb-3(b)(1), unless the authorization is terminated or revoked.     Resp Syncytial Virus by PCR NEGATIVE NEGATIVE Final    Comment: (NOTE) Fact Sheet for Patients: BloggerCourse.com  Fact Sheet for Healthcare Providers: SeriousBroker.it  This test is not yet approved or cleared by the [place] FDA and has been authorized for detection and/or diagnosis of SARS-CoV-2 by FDA under an Emergency Use Authorization (EUA). This EUA will remain in effect (meaning this test can be used) for the duration of the COVID-19 declaration under Section 564(b)(1) of the Act,  21 U.S.C. section 360bbb-3(b)(1), unless the authorization is terminated or revoked.  Performed at [facility], [address], [place], [place] [address]   Urine Culture     Status: Abnormal   Collection Time: 03/26/23  6:35 PM   Specimen: Urine, Random  Result Value Ref Range Status   Specimen Description   Final    URINE, RANDOM Performed at [facility], [address] [place], [place] [address]    Special Requests   Final    NONE Reflexed from [id number] Performed at [facility], [address] [place], [place] [address]    Culture >=100,000 COLONIES/mL ESCHERICHIA COLI (A)  Final   Report Status 03/29/2023 FINAL  Final   Organism ID, Bacteria ESCHERICHIA COLI (A)  Final      Susceptibility   Escherichia coli - MIC*    AMPICILLIN <=2 SENSITIVE Sensitive     CEFAZOLIN <=4 SENSITIVE Sensitive     CEFEPIME <=0.12 SENSITIVE Sensitive     CEFTRIAXONE <=0.25 SENSITIVE Sensitive  CIPROFLOXACIN <=0.25 SENSITIVE Sensitive     GENTAMICIN <=1 SENSITIVE Sensitive     IMIPENEM <=0.25 SENSITIVE Sensitive     NITROFURANTOIN <=16 SENSITIVE Sensitive     TRIMETH/SULFA <=20 SENSITIVE Sensitive     AMPICILLIN/SULBACTAM <=2 SENSITIVE Sensitive     PIP/TAZO <=4 SENSITIVE Sensitive     * >=100,000 COLONIES/mL ESCHERICHIA COLI  Culture, blood (Routine X 2) w Reflex to ID Panel     Status: Abnormal   Collection Time: 03/26/23  6:45 PM   Specimen: BLOOD  Result Value Ref Range Status   Specimen Description   Final    BLOOD BLOOD RIGHT ARM Performed at [facility] [address], [place], [place] [address]    Special Requests   Final    BOTTLES DRAWN AEROBIC AND ANAEROBIC Blood Culture results may not be optimal due to an excessive volume of blood received in culture bottles Performed at [facility], [address] [place], [place] [address]    Culture  Setup Time   Final    GRAM NEGATIVE RODS IN BOTH AEROBIC AND ANAEROBIC  BOTTLES CRITICAL VALUE NOTED.  VALUE IS CONSISTENT WITH PREVIOUSLY REPORTED AND CALLED VALUE. Performed at [facility], [address] [place], [place] [address]    Culture (A)  Final    ESCHERICHIA COLI SUSCEPTIBILITIES PERFORMED ON PREVIOUS CULTURE WITHIN THE LAST 5 DAYS. Performed at [facility], [address], [place], [place] [address]    Report Status 03/29/2023 FINAL  Final  Culture, blood (Routine X 2) w Reflex to ID Panel     Status: Abnormal   Collection Time: 03/26/23  6:45 PM   Specimen: BLOOD  Result Value Ref Range Status   Specimen Description   Final    BLOOD BLOOD LEFT FOREARM Performed at [facility], [address] [place], [place] [address]    Special Requests   Final    BOTTLES DRAWN AEROBIC AND ANAEROBIC Blood Culture results may not be optimal due to an excessive volume of blood received in culture bottles Performed at [facility], [address] [place], [place] [address]    Culture  Setup Time   Final    GRAM NEGATIVE RODS IN BOTH AEROBIC AND ANAEROBIC BOTTLES CRITICAL RESULT CALLED TO, READ BACK BY AND VERIFIED WITHPrincella Ion [facility] 2841 03/27/23 HNM Performed at [facility], [address], [place], [place] [address]    Culture ESCHERICHIA COLI (A)  Final   Report Status 03/29/2023 FINAL  Final   Organism ID, Bacteria ESCHERICHIA COLI  Final      Susceptibility   Escherichia coli - MIC*    AMPICILLIN <=2 SENSITIVE Sensitive     CEFEPIME <=0.12 SENSITIVE Sensitive     CEFTAZIDIME <=1 SENSITIVE Sensitive     CEFTRIAXONE <=0.25 SENSITIVE Sensitive     CIPROFLOXACIN <=0.25 SENSITIVE Sensitive     GENTAMICIN <=1 SENSITIVE Sensitive     IMIPENEM <=0.25 SENSITIVE Sensitive     TRIMETH/SULFA <=20 SENSITIVE Sensitive     AMPICILLIN/SULBACTAM <=2 SENSITIVE Sensitive     PIP/TAZO <=4 SENSITIVE Sensitive     * ESCHERICHIA COLI  Blood Culture ID Panel (Reflexed)     Status: Abnormal   Collection Time:  03/26/23  6:45 PM  Result Value Ref Range Status   Enterococcus faecalis NOT DETECTED NOT DETECTED Final   Enterococcus Faecium NOT DETECTED NOT DETECTED Final   Listeria monocytogenes NOT DETECTED NOT DETECTED Final   Staphylococcus species  NOT DETECTED NOT DETECTED Final   Staphylococcus aureus (BCID) NOT DETECTED NOT DETECTED Final   Staphylococcus epidermidis NOT DETECTED NOT DETECTED Final   Staphylococcus lugdunensis NOT DETECTED NOT DETECTED Final   Streptococcus species NOT DETECTED NOT DETECTED Final   Streptococcus agalactiae NOT DETECTED NOT DETECTED Final   Streptococcus pneumoniae NOT DETECTED NOT DETECTED Final   Streptococcus pyogenes NOT DETECTED NOT DETECTED Final   A.calcoaceticus-baumannii NOT DETECTED NOT DETECTED Final   Bacteroides fragilis NOT DETECTED NOT DETECTED Final   Enterobacterales DETECTED (A) NOT DETECTED Final    Comment: Enterobacterales represent a large order of gram negative bacteria, not a single organism. CRITICAL RESULT CALLED TO, READ BACK BY AND VERIFIED WITH: JASON ROBBINS PHARMD 0535 03/27/23 HNM    Enterobacter cloacae complex NOT DETECTED NOT DETECTED Final   Escherichia coli DETECTED (A) NOT DETECTED Final    Comment: CRITICAL RESULT CALLED TO, READ BACK BY AND VERIFIED WITH: Princella Ion PHARMD 0535 03/27/23 HNM    Klebsiella aerogenes NOT DETECTED NOT DETECTED Final   Klebsiella oxytoca NOT DETECTED NOT DETECTED Final   Klebsiella pneumoniae NOT DETECTED NOT DETECTED Final   Proteus species NOT DETECTED NOT DETECTED Final   Salmonella species NOT DETECTED NOT DETECTED Final   Serratia marcescens NOT DETECTED NOT DETECTED Final   Haemophilus influenzae NOT DETECTED NOT DETECTED Final   Neisseria meningitidis NOT DETECTED NOT DETECTED Final   Pseudomonas aeruginosa NOT DETECTED NOT DETECTED Final   Stenotrophomonas maltophilia NOT DETECTED NOT DETECTED Final   Candida albicans NOT DETECTED NOT DETECTED Final   Candida auris NOT  DETECTED NOT DETECTED Final   Candida glabrata NOT DETECTED NOT DETECTED Final   Candida krusei NOT DETECTED NOT DETECTED Final   Candida parapsilosis NOT DETECTED NOT DETECTED Final   Candida tropicalis NOT DETECTED NOT DETECTED Final   Cryptococcus neoformans/gattii NOT DETECTED NOT DETECTED Final   CTX-M ESBL NOT DETECTED NOT DETECTED Final   Carbapenem resistance IMP NOT DETECTED NOT DETECTED Final   Carbapenem resistance KPC NOT DETECTED NOT DETECTED Final   Carbapenem resistance NDM NOT DETECTED NOT DETECTED Final   Carbapenem resist OXA 48 LIKE NOT DETECTED NOT DETECTED Final   Carbapenem resistance VIM NOT DETECTED NOT DETECTED Final    Comment: Performed at [facility], [address] [place], [place] [address]  Culture, blood (Routine X 2) w Reflex to ID Panel     Status: None (Preliminary result)   Collection Time: 03/29/23  8:12 AM   Specimen: BLOOD RIGHT HAND  Result Value Ref Range Status   Specimen Description BLOOD RIGHT HAND  Final   Special Requests   Final    BOTTLES DRAWN AEROBIC AND ANAEROBIC Blood Culture adequate volume   Culture   Final    NO GROWTH 3 DAYS Performed at [facility], [address] [place], [place] [address]    Report Status PENDING  Incomplete  Culture, blood (Routine X 2) w Reflex to ID Panel     Status: None (Preliminary result)   Collection Time: 03/29/23  8:12 AM   Specimen: BLOOD LEFT HAND  Result Value Ref Range Status   Specimen Description BLOOD LEFT HAND  Final   Special Requests   Final    BOTTLES DRAWN AEROBIC AND ANAEROBIC Blood Culture results may not be optimal due to an inadequate volume of blood received in culture bottles   Culture   Final    NO GROWTH 3 DAYS Performed at [facility], [address] [place], [place] [address]    Report Status PENDING  Incomplete    Labs: CBC: Recent Labs  Lab 03/26/23 1208 03/27/23 0417 03/28/23 0950 03/31/23 0649 04/01/23 0518  WBC 10.7* 6.5  5.6 4.4 4.0  NEUTROABS 8.9*  --   --   --   --   HGB 9.7* 8.5* 10.4* 9.6* 10.0*  HCT 29.9* 25.5* 30.5* 28.5* 29.8*  MCV 100.7* 98.5 94.4 95.6 96.8  PLT 171 119* 137* 140* 160   Basic Metabolic Panel: Recent Labs  Lab 03/28/23 0950 03/29/23 0812 03/30/23 0512 03/31/23 0649 04/01/23 0518  NA 131* 134* 137 135 136  K 3.3* 3.6 3.4* 3.7 4.0  CL 97* 99 104 105 105  CO2 23 23 21* 22 25  GLUCOSE 127* 124* 111* 115* 101*  BUN 13 21 27* 20 14  CREATININE 0.65 0.96 1.10* 0.84 0.75  CALCIUM 8.7* 9.2 8.7* 8.5* 8.7*  MG  --  1.7 1.9  --  1.7  PHOS  --   --  4.1  --   --    Liver Function Tests: Recent Labs  Lab 03/26/23 1208 03/26/23 1845  AST 58* 46*  ALT 28 29  ALKPHOS 95 114  BILITOT 1.4* 1.4*  PROT 5.8* 6.5  ALBUMIN 3.0* 3.2*   CBG: Recent Labs  Lab 03/30/23 2312 03/31/23 1119 03/31/23 1648 03/31/23 2118 04/01/23 0852  GLUCAP 97 122* 87 92 89    Discharge time spent: greater than 30 minutes.  Signed: Marrion Coy, MD [facility] 04/01/2023

## 2023-04-01 NOTE — Progress Notes (Signed)
PT Cancellation Note  Patient Details Name: RYLAN KAUFMANN MRN: [id number] DOB: 04-22-1931   Cancelled Treatment:     Therapist in several times this am to see pt who is having increased difficulty with arousal and appears more lethargic than previous sessions. Pt only able to tolerate bed to chair with nursing, eyes remaining closed, with difficulty staying awake when assisted with breakfast. Will continue PT next available date/time per POC.   Jannet Askew 04/01/2023, 11:31 AM

## 2023-04-01 NOTE — Progress Notes (Signed)
SATURATION QUALIFICATIONS: (This note is used to comply with regulatory documentation for home oxygen)  Patient Saturations on Room Air at Rest = 93%  Patient Saturations on Room Air while Ambulating = 86%  Patient Saturations on 2 Liters of oxygen while Ambulating = 92%  Please briefly explain why patient needs home oxygen:  Patient was unable to keep SpO2 >86% on RA while ambulating. Patient complained of SOB and we had to put her on 2L [place] . Dr Chipper Herb was notified.

## 2023-04-01 NOTE — TOC Transition Note (Signed)
Transition of Care [facility]) - CM/SW Discharge Note   Patient Details  Name: Jacqueline Tate MRN: [id number] Date of Birth: [birth year]  Transition of Care [facility]) CM/SW Contact:  Bing Quarry, RN Phone Number: 04/01/2023, 11:07 AM   Clinical Narrative: 04/01/23: Patient has discharge orders in for today. Per prior CM notes, HH set up with [facility] and patient has DME RW and cane at home. Son, will transport home.Family support in place and have arranged a schedule for caregiving as patient from home alone. Family is also going to pursue PCS in the home, a referral was made to [facility] with permission. Until then family schedule for care is as follows from prior CM note.  [name] states he states at the patients home from 7pm-6 am [name] comes at 6 am to administer medications [name] comes to check on her around 10 am and 1 pm And [name] comes and stays with her from 5pm-7pm [name] will transport home on discharge [facility] notified of DC today at 1110 am.   Gabriel Cirri RN CM [phone] (Weekends only).     Final next level of care: Home w Home Health Services Barriers to Discharge: Barriers Resolved   Patient Goals and CMS Choice      Discharge Placement                         Discharge Plan and Services Additional resources added to the After Visit Summary for                  DME Arranged: N/A (RW and cane at home.) DME Agency: NA       HH Arranged: RN, PT, OT, Nurse's Aide HH Agency: [facility] Date HH Agency Contacted: 03/28/23   Representative spoke with at [facility] Agency:  (Prior RN CM  contacted [facility] per notes on 03/28/23.)  Social Determinants of Health (SDOH) Interventions SDOH Screenings   Food Insecurity: Patient Declined (03/27/2023)  Transportation Needs: Patient Declined (03/27/2023)  Utilities: Patient Declined (03/27/2023)  Tobacco Use: Low Risk  (03/26/2023)     Readmission Risk Interventions     No data  to display

## 2023-04-02 DIAGNOSIS — A419 Sepsis, unspecified organism: Secondary | ICD-10-CM | POA: Diagnosis not present

## 2023-04-02 DIAGNOSIS — R6520 Severe sepsis without septic shock: Secondary | ICD-10-CM | POA: Diagnosis not present

## 2023-04-02 DIAGNOSIS — G9340 Encephalopathy, unspecified: Secondary | ICD-10-CM | POA: Diagnosis not present

## 2023-04-02 DIAGNOSIS — G301 Alzheimer's disease with late onset: Secondary | ICD-10-CM | POA: Diagnosis not present

## 2023-04-02 LAB — CULTURE, BLOOD (ROUTINE X 2): Special Requests: ADEQUATE

## 2023-04-02 LAB — GLUCOSE, CAPILLARY
Glucose-Capillary: 113 mg/dL — ABNORMAL HIGH (ref 70–99)
Glucose-Capillary: 112 mg/dL — ABNORMAL HIGH (ref 70–99)
Glucose-Capillary: 116 mg/dL — ABNORMAL HIGH (ref 70–99)
Glucose-Capillary: 96 mg/dL (ref 70–99)

## 2023-04-02 MED ORDER — DICLOFENAC SODIUM 1 % EX GEL: 2.0000 g | Freq: Four times a day (QID) | CUTANEOUS | Status: DC | PRN

## 2023-04-02 MED ORDER — QUETIAPINE FUMARATE 25 MG PO TABS: 75.0000 mg | ORAL_TABLET | Freq: Every day | ORAL | Status: DC

## 2023-04-02 MED ORDER — FUROSEMIDE 40 MG PO TABS
40.0000 mg | ORAL_TABLET | Freq: Once | ORAL | Status: AC
  Administered 2023-04-02: 40 mg via ORAL
  Filled 2023-04-02: qty 1

## 2023-04-02 MED ORDER — DICLOFENAC SODIUM 1 % EX GEL
2.0000 g | Freq: Four times a day (QID) | CUTANEOUS | Status: DC
  Administered 2023-04-02 – 2023-04-03 (×3): 2 g via TOPICAL
  Filled 2023-04-02: qty 100

## 2023-04-02 MED ORDER — QUETIAPINE FUMARATE 25 MG PO TABS
25.0000 mg | ORAL_TABLET | Freq: Once | ORAL | Status: AC
  Administered 2023-04-02: 25 mg via ORAL
  Filled 2023-04-02: qty 1

## 2023-04-02 MED ORDER — SODIUM CHLORIDE 0.9 % IV SOLN
2.0000 g | INTRAVENOUS | Status: DC
  Administered 2023-04-02: 2 g via INTRAVENOUS
  Filled 2023-04-02: qty 2
  Filled 2023-04-02: qty 20

## 2023-04-02 NOTE — Progress Notes (Signed)
Physical Therapy Treatment Patient Details Name: Jacqueline Tate MRN: [id number] DOB: 04-Feb-1931 Today's Date: 04/02/2023   History of Present Illness 89 y/o female presented to ED on 03/26/23 for generalized weakness, lethargy, bleeding from hemorrhoids and on lips, and fall. Admitted for sepsis 2/2 UTI. 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    PT Comments    Pt with eyes open upon arrival but remains lethargic.  Assisted pt to EOB with mod a x 1 in attempts to awaken.  Given cold cloth and attempted drink by stray which she would not take and on spoon which she again denied.  She is able to sit unsupported with no LOB.  She does moan a bit in sitting which her daughter stated usually means she needs to void.  Stood and assisted to [facility] with stand pivot transfer and increased time and cues.  Very slowed ability to step and does seems to "freeze" during activity almost asleep on her feet.  She is able to void with time on [facility] and does need some increased assist to transition back to bed.  Max a to return to supine.  Family does state she sleeps late at home often not getting up until later in AM almost lunch time.  Will try to see pt in PM's in future sessions.   Recommendations for follow up therapy are one component of a multi-disciplinary discharge planning process, led by the attending physician.  Recommendations may be updated based on patient status, additional functional criteria and insurance authorization.  Follow Up Recommendations       Assistance Recommended at Discharge Frequent or constant Supervision/Assistance  Patient can return home with the following A lot of help with walking and/or transfers;A little help with bathing/dressing/bathroom;Assistance with cooking/housework;Direct supervision/assist for medications management;Direct supervision/assist for financial management;Assist for transportation;Help with stairs or ramp for entrance    Equipment Recommendations  Rolling walker (2 wheels)    Recommendations for Other Services       Precautions / Restrictions Precautions Precautions: Fall Precaution Comments: dementia Restrictions Weight Bearing Restrictions: No     Mobility  Bed Mobility Overal bed mobility: Needs Assistance Bed Mobility: Supine to Sit, Sit to Supine     Supine to sit: Mod assist, Max assist Sit to supine: Max assist, Total assist        Transfers Overall transfer level: Needs assistance Equipment used: None Transfers: Sit to/from Stand, Bed to chair/wheelchair/BSC   Stand pivot transfers: Min assist, Mod assist         General transfer comment: difficulty following cues today due to lethargy    Ambulation/Gait                   Stairs             Wheelchair Mobility    Modified Rankin (Stroke Patients Only)       Balance Overall balance assessment: Needs assistance, History of Falls Sitting-balance support: Feet supported, No upper extremity supported Sitting balance-Leahy Scale: Good     Standing balance support: Bilateral upper extremity supported Standing balance-Leahy Scale: Fair Standing balance comment: handheld assist today                            Cognition Arousal/Alertness: Lethargic Behavior During Therapy: Flat affect Overall Cognitive Status: History of cognitive impairments - at baseline  General Comments: very lethargic today unable to partcipate in functional activities        Exercises      General Comments        Pertinent Vitals/Pain Pain Assessment Pain Assessment: No/denies pain    Home Living                          Prior Function            PT Goals (current goals can now be found in the care plan section) Progress towards PT goals: Progressing toward goals    Frequency    Min 3X/week      PT Plan Current plan remains  appropriate    Co-evaluation              AM-PAC PT "6 Clicks" Mobility   Outcome Measure  Help needed turning from your back to your side while in a flat bed without using bedrails?: A Little Help needed moving from lying on your back to sitting on the side of a flat bed without using bedrails?: A Little Help needed moving to and from a bed to a chair (including a wheelchair)?: A Little Help needed standing up from a chair using your arms (e.g., wheelchair or bedside chair)?: A Little Help needed to walk in hospital room?: A Little Help needed climbing 3-5 steps with a railing? : A Lot 6 Click Score: 17    End of Session Equipment Utilized During Treatment: Gait belt Activity Tolerance: Patient tolerated treatment well Patient left: in bed;with bed alarm set;with call bell/phone within reach Nurse Communication: Mobility status;Other (comment) PT Visit Diagnosis: Unsteadiness on feet (R26.81);Muscle weakness (generalized) (M62.81);History of falling (Z91.81);Difficulty in walking, not elsewhere classified (R26.2)     Time: 1610-9604 PT Time Calculation (min) (ACUTE ONLY): 20 min  Charges:  $Therapeutic Activity: 8-22 mins                   Danielle Dess, PTA 04/02/23, 1:04 PM

## 2023-04-02 NOTE — TOC Progression Note (Signed)
Transition of Care [facility]) - Progression Note    Patient Details  Name: Jacqueline Tate MRN: [id number] Date of Birth: March 31, 1931  Transition of Care [facility]) CM/SW Contact  Bing Quarry, RN Phone Number: 04/02/2023, 8:54 AM  Clinical Narrative: 04/02/23: Patient did not discharge yesterday. PT notes indicate patient was lethargic and then an oxygen saturation documentation indicated patient was placed on oxygen at 2L/[place]. [facility] aware. Will follow. Gabriel Cirri RN CM         Barriers to Discharge: Barriers Resolved  Expected Discharge Plan and Services         Expected Discharge Date: 04/01/23               DME Arranged: N/A (RW and cane at home.) DME Agency: NA       HH Arranged: RN, PT, OT, Nurse's Aide HH Agency: [facility] Date HH Agency Contacted: 03/28/23   Representative spoke with at [facility] Agency:  (Prior RN CM  contacted [facility] per notes on 03/28/23.)   Social Determinants of Health (SDOH) Interventions SDOH Screenings   Food Insecurity: Patient Declined (03/27/2023)  Transportation Needs: Patient Declined (03/27/2023)  Utilities: Patient Declined (03/27/2023)  Tobacco Use: Low Risk  (03/26/2023)    Readmission Risk Interventions     No data to display

## 2023-04-02 NOTE — Progress Notes (Signed)
Progress Note   Patient: Jacqueline Tate ION:[id number] DOB: June 13, 1931 DOA: 03/26/2023     6 DOS: the patient was seen and examined on 04/02/2023   Brief hospital course: 89 year old female with history of Alzheimer's dementia, hypertension, hypothyroidism, type 2 diabetes mellitus, chronic kidney disease, previous DVT PE on Eliquis.  Patient coming in with altered mental status Blood cultures and urine culture are positive for E. coli.  Patient was treated with ceftriaxone.    Principal Problem:   Severe sepsis Active Problems:   Acute encephalopathy   AKI (acute kidney injury)   Macrocytic anemia   Bright red blood per rectum   Leukocytosis   Alzheimer's type dementia with late onset without behavioral disturbance   Hypertension   Hypothyroid   Type 2 diabetes mellitus without complication, without long-term current use of insulin   Hypokalemia   Thrombocytopenia   Hyponatremia   Alzheimer's dementia with behavioral disturbance   Failure to thrive in adult   E. coli septicemia   E. coli UTI   Iron deficiency anemia   Metabolic acidosis   Assessment and Plan:  Severe sepsis secondary to urinary tract infection. E. coli septicemia secondary to UTI. E. coli urinary tract infection. Patient met severe sepsis criteria at time of admission with tachycardia, tachypnea and leukocytosis, acute kidney injury and altered mental status. This is secondary to UTI secondary to E. coli infection. Repeat blood cultures so far negative.  Final culture results showed a pansensitive E. coli.  Will change to Keflex at time of discharge.   Patient still on oxygen this morning, family wished to keep patient until Monday.  Will take off oxygen after giving 40 mg oral Lasix.  Overall patient does not have secondary volume overload.  She just cannot tolerate any fluids.   Acute encephalopathy Alzheimer's dementia with behavioral disturbance. Patient chronically taking Seroquel 75 mg every  evening, she still could not sleep last night, she was agitated.  Added melatonin to the regimen. Patient no longer has any agitation.  Resume home regimen at discharge.   Failure to thrive. Anorexia. Had a family meeting with the son and the daughter, patient has not been eating at home, she lost 9 pounds of body weight within a month.  Long-term prognosis is poor, meeting with palliative care present, decision was made to continue palliative care at home, set up home care for now.  If condition deteriorates, patient can be transferred to hospice.  We also talk about DNR status, family need to talk among themselves to make a decision. Palliative care will continue to follow patient after discharge, may transition to hospice if condition is getting worse. Due to poor prognosis, home medication has been simplified.   AKI (acute kidney injury) Hyponatremia. Hypokalemia. Mild metabolic acidosis. Conditions are improved.   Bright red blood per rectum Iron deficient anemia. Rectal bleeding appears to be secondary to hemorrhoids, no additional bleeding.  Patient has significant iron deficiency, started on iron supplement.  B12 level normal.   Hypertension Resume home regimen.   Hypothyroid Continue Synthroid   Type 2 diabetes mellitus without complication, without long-term current use of insulin Follow-up with PCP as outpatient.       Subjective:  Patient is sleepy today, otherwise no complaint.  Physical Exam: Vitals:   04/01/23 1500 04/01/23 1927 04/02/23 0429 04/02/23 0759  BP: (!) 145/79 (!) 144/68 127/60 (!) 152/67  Pulse: 73 71 63 66  Resp: Temp: 97.7 F (36.5 C) 97.8  F (36.6 C) (!) 97.4 F (36.3 C) 98.5 F (36.9 C)  TempSrc: Axillary Oral Axillary   SpO2: 100% 100% 93% 95%  Weight:   68.9 kg   Height:       General exam: Appears calm and comfortable  Respiratory system: Clear to auscultation. Respiratory effort normal. Cardiovascular system: S1 &  S2 heard, RRR. No JVD, murmurs, rubs, gallops or clicks. No pedal edema. Gastrointestinal system: Abdomen is nondistended, soft and nontender. No organomegaly or masses felt. Normal bowel sounds heard. Central nervous system: Drowsy and confused.. Extremities: Symmetric 5 x 5 power. Skin: No rashes, lesions or ulcers Psychiatry: Mood & affect appropriate.    Data Reviewed:  There are no new results to review at this time.  Family Communication: Son updated at bedside.  Disposition: Status is: Inpatient Remains inpatient appropriate because: Severity of disease, IV treatment.     Time spent: 35 minutes  Author: Marrion Coy, MD 04/02/2023 11:29 AM  For on call review [email or web address].

## 2023-04-03 DIAGNOSIS — N390 Urinary tract infection, site not specified: Secondary | ICD-10-CM | POA: Diagnosis not present

## 2023-04-03 DIAGNOSIS — G309 Alzheimer's disease, unspecified: Secondary | ICD-10-CM | POA: Diagnosis not present

## 2023-04-03 DIAGNOSIS — A4151 Sepsis due to Escherichia coli [E. coli]: Secondary | ICD-10-CM | POA: Diagnosis not present

## 2023-04-03 DIAGNOSIS — A419 Sepsis, unspecified organism: Secondary | ICD-10-CM | POA: Diagnosis not present

## 2023-04-03 LAB — CULTURE, BLOOD (ROUTINE X 2)

## 2023-04-03 LAB — GLUCOSE, CAPILLARY: Glucose-Capillary: 102 mg/dL — ABNORMAL HIGH (ref 70–99)

## 2023-04-03 MED ORDER — DICLOFENAC SODIUM 1 % EX GEL: 2.0000 g | Freq: Four times a day (QID) | CUTANEOUS | 0 refills | Status: DC

## 2023-04-03 NOTE — Progress Notes (Signed)
SATURATION QUALIFICATIONS: (This note is used to comply with regulatory documentation for home oxygen)  Patient Saturations on Room Air at Rest = 99  Patient Saturations on Room Air while Ambulating = 93  

## 2023-04-03 NOTE — Progress Notes (Signed)
Fed pt 5 bites of mashed potatoes and breaded chicken then pt asked me to stop.

## 2023-04-03 NOTE — TOC Transition Note (Signed)
Transition of Care [facility]) - CM/SW Discharge Note   Patient Details  Name: Jacqueline Tate MRN: [id number] Date of Birth: 1931/01/27  Transition of Care [facility]) CM/SW Contact:  Chapman Fitch, RN Phone Number: 04/03/2023, 11:50 AM   Clinical Narrative:     Patient to discharge today Son [name] to transport [place] with [facility] notified Patient did not qualify for home O2  Final next level of care: Home w Home Health Services Barriers to Discharge: Barriers Resolved   Patient Goals and CMS Choice      Discharge Placement                         Discharge Plan and Services Additional resources added to the After Visit Summary for                  DME Arranged: N/A (RW and cane at home.) DME Agency: NA       HH Arranged: RN, PT, OT, Nurse's Aide HH Agency: [facility] Date HH Agency Contacted: 04/03/23   Representative spoke with at [facility] Agency: Barbara Cower  Social Determinants of Health (SDOH) Interventions SDOH Screenings   Food Insecurity: Patient Declined (03/27/2023)  Transportation Needs: Patient Declined (03/27/2023)  Utilities: Patient Declined (03/27/2023)  Tobacco Use: Low Risk  (03/26/2023)     Readmission Risk Interventions     No data to display

## 2023-04-03 NOTE — Progress Notes (Signed)
Reed Breech to be D/C'd Home per MD order.  Discussed prescriptions and follow up appointments with the patient. Prescriptions given to patient, medication list explained in detail. Pt verbalized understanding.  Allergies as of 04/03/2023       Reactions   Codeine Nausea And Vomiting   Latex    Simvastatin Other (See Comments)   Other reaction(s): Muscle Pain Weakness to leg muscles & weakness Weakness to leg muscles   Penicillins Rash   Tolerated ceftriaxone 03/2023 Has patient had a PCN reaction causing immediate rash, facial/tongue/throat swelling, SOB or lightheadedness with hypotension: Yes Has patient had a PCN reaction causing severe rash involving mucus membranes or skin necrosis: No Has patient had a PCN reaction that required hospitalization No Has patient had a PCN reaction occurring within the last 10 years: Yes If all of the above answers are "NO", then may proceed with Cephalosporin use.   Sulfa Antibiotics Rash        Medication List     STOP taking these medications    cyclobenzaprine 5 MG tablet Commonly known as: FLEXERIL   gabapentin 600 MG tablet Commonly known as: NEURONTIN   loratadine 10 MG tablet Commonly known as: CLARITIN   metoCLOPramide 10 MG tablet Commonly known as: REGLAN   omeprazole 20 MG capsule Commonly known as: Teacher, music injection Generic drug: COVID-19 mRNA bivalent vaccine Proofreader)   [facility] PO       TAKE these medications    acetaminophen 500 MG tablet Commonly known as: TYLENOL Take 500 mg by mouth 6 (six) times daily.   bisacodyl 5 MG EC tablet Commonly known as: DULCOLAX Take 5 mg by mouth daily as needed for moderate constipation.   cephALEXin 500 MG capsule Commonly known as: KEFLEX Take 2 capsules (1,000 mg total) by mouth every 8 (eight) hours for 8 days.   diclofenac Sodium 1 % Gel Commonly known as: VOLTAREN Apply 2 g topically 4 (four) times daily.    docusate sodium 100 MG capsule Commonly known as: COLACE Take 100 mg by mouth daily as needed for moderate constipation or mild constipation.   DULoxetine 60 MG capsule Commonly known as: CYMBALTA Take 1 capsule by mouth daily.   Eliquis 5 MG Tabs tablet Generic drug: apixaban Take 5 mg by mouth 2 (two) times daily.   fluticasone 50 MCG/ACT nasal spray Commonly known as: FLONASE Place 1 spray into both nostrils daily as needed.   levothyroxine 50 MCG tablet Commonly known as: SYNTHROID Take 1 tablet by mouth daily.   lisinopril 20 MG tablet Commonly known as: ZESTRIL Take 1 tablet by mouth daily.   metoprolol tartrate 50 MG tablet Commonly known as: LOPRESSOR Take 1 tablet by mouth 2 (two) times daily.   Polyethyl Glycol-Propyl Glycol 0.4-0.3 % Soln Apply to eye 2 (two) times daily.   QUEtiapine 25 MG tablet Commonly known as: SEROQUEL Take 25 mg by mouth at bedtime.   QUEtiapine 50 MG tablet Commonly known as: SEROQUEL Take 50 mg by mouth at bedtime.        Vitals:   04/03/23 0348 04/03/23 1100  BP: (!) 121/56   Pulse: 64   Resp: 16   Temp: 98.1 F (36.7 C)   SpO2: 96% 93%    Skin clean, dry and intact without evidence of skin break down, no evidence of skin tears noted. IV catheter discontinued intact. Site without signs and symptoms of complications. Dressing and pressure applied. Pt denies  pain at this time. No complaints noted.  An After Visit Summary was printed and given to the patient. Patient escorted via [place], and D/C home via private auto.  [place] C. Deatra Ina

## 2023-04-03 NOTE — Discharge Summary (Signed)
Physician Discharge Summary   Patient: Jacqueline Tate MRN: [id number] DOB: August 23, 1931  Admit date:     03/26/2023  Discharge date: 04/03/23  Discharge Physician: Marrion Coy   PCP: Kandyce Rud, MD   Recommendations at discharge:   Follow-up PCP in 1 week. Follow-up with palliative care as outpatient, may transfer to hospice if condition deteriorates.  Discharge Diagnoses: Principal Problem:   Severe sepsis Active Problems:   Acute encephalopathy   AKI (acute kidney injury)   Macrocytic anemia   Bright red blood per rectum   Leukocytosis   Alzheimer's type dementia with late onset without behavioral disturbance   Hypertension   Hypothyroid   Type 2 diabetes mellitus without complication, without long-term current use of insulin   Hypokalemia   Thrombocytopenia   Hyponatremia   Alzheimer's dementia with behavioral disturbance   Failure to thrive in adult   E. coli septicemia   E. coli UTI   Iron deficiency anemia   Metabolic acidosis  Resolved Problems:   * No resolved hospital problems. * Acute hypoxia without respiratory failure. 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  Patient coming in with altered mental status Blood cultures and urine culture are positive for E. coli.  Patient was treated with ceftriaxone.   Assessment and Plan: Severe sepsis secondary to urinary tract infection. E. coli septicemia secondary to UTI. E. coli urinary tract infection. Patient met severe sepsis criteria at time of admission with tachycardia, tachypnea and leukocytosis, acute kidney injury and altered mental status. This is secondary to UTI secondary to E. coli infection. Repeat blood cultures so far negative.  Final culture results showed a pansensitive E. coli.  Will change to Keflex at time of discharge.   Patient developed mild hypoxia  after giving fluids, her oxygen saturation dropped after walking, she was given a dose of oral Lasix.  Her condition appears to be improving, will obtain another home oxygen evaluation before discharge.   Acute encephalopathy Alzheimer's dementia with behavioral disturbance. Patient chronically taking Seroquel 75 mg every evening, she still could not sleep last night, she was agitated.  Added melatonin to the regimen. Patient no longer has any agitation.  Resume home regimen at discharge.   Failure to thrive. Anorexia. Had a family meeting with the son and the daughter, patient has not been eating at home, she lost 9 pounds of body weight within a month.  Long-term prognosis is poor, meeting with palliative care present, decision was made to continue palliative care at home, set up home care for now.  If condition deteriorates, patient can be transferred to hospice.  We also talk about DNR status, family need to talk among themselves to make a decision. Palliative care will continue to follow patient after discharge, may transition to hospice if condition is getting worse. Due to poor prognosis, home medication has been simplified.   AKI (acute kidney injury) Hyponatremia. Hypokalemia. Mild metabolic acidosis. Conditions are improved.   Bright red blood per rectum Iron deficient anemia. Rectal bleeding appears to be secondary to hemorrhoids, no additional bleeding.  Patient has significant iron deficiency, started on iron supplement.  B12 level normal.   Hypertension Resume home regimen.   Hypothyroid Continue Synthroid   Type 2 diabetes mellitus without complication, without long-term current use of insulin Follow-up with PCP as outpatient.       Consultants: Palliative care Procedures performed: None  Disposition: Home  health Diet recommendation:  Discharge Diet Orders (From admission, onward)     Start     Ordered   04/01/23 0000  Diet general       Comments: Dys 3 diet    04/01/23 1058           Cardiac diet DISCHARGE MEDICATION: Allergies as of 04/03/2023       Reactions   Codeine Nausea And Vomiting   Latex    Simvastatin Other (See Comments)   Other reaction(s): Muscle Pain Weakness to leg muscles & weakness Weakness to leg muscles   Penicillins Rash   Tolerated ceftriaxone 03/2023 Has patient had a PCN reaction causing immediate rash, facial/tongue/throat swelling, SOB or lightheadedness with hypotension: Yes Has patient had a PCN reaction causing severe rash involving mucus membranes or skin necrosis: No Has patient had a PCN reaction that required hospitalization No Has patient had a PCN reaction occurring within the last 10 years: Yes If all of the above answers are "NO", then may proceed with Cephalosporin use.   Sulfa Antibiotics Rash        Medication List     STOP taking these medications    cyclobenzaprine 5 MG tablet Commonly known as: FLEXERIL   gabapentin 600 MG tablet Commonly known as: NEURONTIN   loratadine 10 MG tablet Commonly known as: CLARITIN   metoCLOPramide 10 MG tablet Commonly known as: REGLAN   omeprazole 20 MG capsule Commonly known as: Teacher, music injection Generic drug: COVID-19 mRNA bivalent vaccine Proofreader)   PHILLIPS COLON HEALTH PO       TAKE these medications    acetaminophen 500 MG tablet Commonly known as: TYLENOL Take 500 mg by mouth 6 (six) times daily.   bisacodyl 5 MG EC tablet Commonly known as: DULCOLAX Take 5 mg by mouth daily as needed for moderate constipation.   cephALEXin 500 MG capsule Commonly known as: KEFLEX Take 2 capsules (1,000 mg total) by mouth every 8 (eight) hours for 8 days.   diclofenac Sodium 1 % Gel Commonly known as: VOLTAREN Apply 2 g topically 4 (four) times daily.   docusate sodium 100 MG capsule Commonly known as: COLACE Take 100 mg by mouth daily as needed for moderate constipation or mild constipation.    DULoxetine 60 MG capsule Commonly known as: CYMBALTA Take 1 capsule by mouth daily.   Eliquis 5 MG Tabs tablet Generic drug: apixaban Take 5 mg by mouth 2 (two) times daily.   fluticasone 50 MCG/ACT nasal spray Commonly known as: FLONASE Place 1 spray into both nostrils daily as needed.   levothyroxine 50 MCG tablet Commonly known as: SYNTHROID Take 1 tablet by mouth daily.   lisinopril 20 MG tablet Commonly known as: ZESTRIL Take 1 tablet by mouth daily.   metoprolol tartrate 50 MG tablet Commonly known as: LOPRESSOR Take 1 tablet by mouth 2 (two) times daily.   Polyethyl Glycol-Propyl Glycol 0.4-0.3 % Soln Apply to eye 2 (two) times daily.   QUEtiapine 25 MG tablet Commonly known as: SEROQUEL Take 25 mg by mouth at bedtime.   QUEtiapine 50 MG tablet Commonly known as: SEROQUEL Take 50 mg by mouth at bedtime.        Follow-up Information     Kandyce Rud, MD Follow up in 1 week(s).   Specialty: Family Medicine Contact information: [address] [facility] [place] [address] [phone]  Discharge Exam: Filed Weights   03/29/23 0402 03/31/23 0500 04/02/23 0429  Weight: 66.6 kg 66.6 kg 68.9 kg   General exam: Appears calm and comfortable  Respiratory system: Clear to auscultation. Respiratory effort normal. Cardiovascular system: S1 & S2 heard, RRR. No JVD, murmurs, rubs, gallops or clicks. No pedal edema. Gastrointestinal system: Abdomen is nondistended, soft and nontender. No organomegaly or masses felt. Normal bowel sounds heard. Central nervous system: Alert and oriented x2. No focal neurological deficits. Extremities: Symmetric 5 x 5 power. Skin: No rashes, lesions or ulcers Psychiatry:  Mood & affect appropriate.    Condition at discharge: fair  The results of significant diagnostics from this hospitalization (including imaging, microbiology, ancillary and laboratory) are  listed below for reference.   Imaging Studies: US RENAL  Result Date: 03/26/2023 CLINICAL DATA:  [id number] AKI (acute kidney injury) [id number] EXAM: RENAL / URINARY TRACT ULTRASOUND COMPLETE COMPARISON:  None Available. FINDINGS: Right Kidney: Renal measurements: 10.4 x 4.8 x 3.6 cm = volume: 93.6 mL. Echogenicity within normal limits. Fullness of the right collecting system. No mass. No frank hydronephrosis visualized. Left Kidney: Renal measurements: 9.6 x 5.7 x 4.8 cm = volume: 137 mL. Echogenicity within normal limits. No mass or hydronephrosis visualized. Urinary bladder: Distended with urine. Debris/sediment noted throughout the urinary bladder lumen. Other: None. IMPRESSION: 1. Fullness of the right collecting system likely due to urinary bladder filled with urine. Unable to make patient void as patient altered. 2. Debris throughout the urinary bladder lumen. Finding may represent sediment versus infection. Correlate with urinalysis. Electronically Signed   By: Tish Frederickson M.D.   On: 03/26/2023 18:56   CT Head Wo Contrast  Result Date: 03/26/2023 CLINICAL DATA:  Mental status change, unknown cause EXAM: CT HEAD WITHOUT CONTRAST TECHNIQUE: Contiguous axial images were obtained from the base of the skull through the vertex without intravenous contrast. RADIATION DOSE REDUCTION: This exam was performed according to the departmental dose-optimization program which includes automated exposure control, adjustment of the mA and/or kV according to patient size and/or use of iterative reconstruction technique. COMPARISON:  CT head 11/29/2022. FINDINGS: Brain: No evidence of acute infarction, hemorrhage, hydrocephalus, extra-axial collection or mass lesion/mass effect. Remote right PCA territory infarct. Vascular: Calcific atherosclerosis. No hyperdense vessel identified. Skull: No acute fracture. Sinuses/Orbits: Right maxillary sinus mucosal thickening. No acute orbital findings. Other: No mastoid effusions.  IMPRESSION: 1. No evidence of acute abnormality. 2. Remote right PCA territory infarct. Electronically Signed   By: Feliberto Harts M.D.   On: 03/26/2023 13:43   DG Chest Portable 1 View  Result Date: 03/26/2023 CLINICAL DATA:  Weakness, cough. EXAM: PORTABLE CHEST 1 VIEW COMPARISON:  Chest x-ray dated 11/29/2022 FINDINGS: Heart size and mediastinal contours are grossly stable. Lungs are clear. No pleural effusion or pneumothorax is seen. Osseous structures about the chest are unremarkable. IMPRESSION: No active disease. No evidence of pneumonia or pulmonary edema. Electronically Signed   By: Bary Richard M.D.   On: 03/26/2023 12:57    Microbiology: Results for orders placed or performed during the hospital encounter of 03/26/23  Resp panel by RT-PCR (RSV, Flu A&B, Covid) Anterior Nasal Swab     Status: None   Collection Time: 03/26/23  1:02 PM   Specimen: Anterior Nasal Swab  Result Value Ref Range Status   SARS Coronavirus 2 by RT PCR NEGATIVE NEGATIVE Final    Comment: (NOTE) SARS-CoV-2 target nucleic acids are NOT DETECTED.  The SARS-CoV-2 RNA is generally detectable in upper respiratory specimens during  the acute phase of infection. The lowest concentration of SARS-CoV-2 viral copies this assay can detect is 138 copies/mL. A negative result does not preclude SARS-Cov-2 infection and should not be used as the sole basis for treatment or other patient management decisions. A negative result may occur with  improper specimen collection/handling, submission of specimen other than nasopharyngeal swab, presence of viral mutation(s) within the areas targeted by this assay, and inadequate number of viral copies(<138 copies/mL). A negative result must be combined with clinical observations, patient history, and epidemiological information. The expected result is Negative.  Fact Sheet for Patients:  BloggerCourse.com  Fact Sheet for Healthcare Providers:   SeriousBroker.it  This test is no t yet approved or cleared by the [place] FDA and  has been authorized for detection and/or diagnosis of SARS-CoV-2 by FDA under an Emergency Use Authorization (EUA). This EUA will remain  in effect (meaning this test can be used) for the duration of the COVID-19 declaration under Section 564(b)(1) of the Act, 21 U.S.C.section 360bbb-3(b)(1), unless the authorization is terminated  or revoked sooner.       Influenza A by PCR NEGATIVE NEGATIVE Final   Influenza B by PCR NEGATIVE NEGATIVE Final    Comment: (NOTE) The Xpert Xpress SARS-CoV-2/FLU/RSV plus assay is intended as an aid in the diagnosis of influenza from Nasopharyngeal swab specimens and should not be used as a sole basis for treatment. Nasal washings and aspirates are unacceptable for Xpert Xpress SARS-CoV-2/FLU/RSV testing.  Fact Sheet for Patients: BloggerCourse.com  Fact Sheet for Healthcare Providers: SeriousBroker.it  This test is not yet approved or cleared by the [place] FDA and has been authorized for detection and/or diagnosis of SARS-CoV-2 by FDA under an Emergency Use Authorization (EUA). This EUA will remain in effect (meaning this test can be used) for the duration of the COVID-19 declaration under Section 564(b)(1) of the Act, 21 U.S.C. section 360bbb-3(b)(1), unless the authorization is terminated or revoked.     Resp Syncytial Virus by PCR NEGATIVE NEGATIVE Final    Comment: (NOTE) Fact Sheet for Patients: BloggerCourse.com  Fact Sheet for Healthcare Providers: SeriousBroker.it  This test is not yet approved or cleared by the [place] FDA and has been authorized for detection and/or diagnosis of SARS-CoV-2 by FDA under an Emergency Use Authorization (EUA). This EUA will remain in effect (meaning this test can be used) for  the duration of the COVID-19 declaration under Section 564(b)(1) of the Act, 21 U.S.C. section 360bbb-3(b)(1), unless the authorization is terminated or revoked.  Performed at [facility], [address] [place], [place] [address]   Urine Culture     Status: Abnormal   Collection Time: 03/26/23  6:35 PM   Specimen: Urine, Random  Result Value Ref Range Status   Specimen Description   Final    URINE, RANDOM Performed at [facility], [address] [place], [place] [address]    Special Requests   Final    NONE Reflexed from [id number] Performed at [facility], [address] [place], [place] [address]    Culture >=100,000 COLONIES/mL ESCHERICHIA COLI (A)  Final   Report Status 03/29/2023 FINAL  Final   Organism ID, Bacteria ESCHERICHIA COLI (A)  Final      Susceptibility   Escherichia coli - MIC*    AMPICILLIN <=2 SENSITIVE Sensitive     CEFAZOLIN <=4 SENSITIVE Sensitive     CEFEPIME <=0.12 SENSITIVE Sensitive     CEFTRIAXONE <=0.25 SENSITIVE Sensitive  CIPROFLOXACIN <=0.25 SENSITIVE Sensitive     GENTAMICIN <=1 SENSITIVE Sensitive     IMIPENEM <=0.25 SENSITIVE Sensitive     NITROFURANTOIN <=16 SENSITIVE Sensitive     TRIMETH/SULFA <=20 SENSITIVE Sensitive     AMPICILLIN/SULBACTAM <=2 SENSITIVE Sensitive     PIP/TAZO <=4 SENSITIVE Sensitive     * >=100,000 COLONIES/mL ESCHERICHIA COLI  Culture, blood (Routine X 2) w Reflex to ID Panel     Status: Abnormal   Collection Time: 03/26/23  6:45 PM   Specimen: BLOOD  Result Value Ref Range Status   Specimen Description   Final    BLOOD BLOOD RIGHT ARM Performed at [facility], [address], [place], [place] [address]    Special Requests   Final    BOTTLES DRAWN AEROBIC AND ANAEROBIC Blood Culture results may not be optimal due to an excessive volume of blood received in culture bottles Performed at [facility], [address] [place], [place] [address]    Culture   Setup Time   Final    GRAM NEGATIVE RODS IN BOTH AEROBIC AND ANAEROBIC BOTTLES CRITICAL VALUE NOTED.  VALUE IS CONSISTENT WITH PREVIOUSLY REPORTED AND CALLED VALUE. Performed at [facility], [address] [place], [place] [address]    Culture (A)  Final    ESCHERICHIA COLI SUSCEPTIBILITIES PERFORMED ON PREVIOUS CULTURE WITHIN THE LAST 5 DAYS. Performed at [facility], [address], [place], [place] [address]    Report Status 03/29/2023 FINAL  Final  Culture, blood (Routine X 2) w Reflex to ID Panel     Status: Abnormal   Collection Time: 03/26/23  6:45 PM   Specimen: BLOOD  Result Value Ref Range Status   Specimen Description   Final    BLOOD BLOOD LEFT FOREARM Performed at [facility], [address] [place], [place] [address]    Special Requests   Final    BOTTLES DRAWN AEROBIC AND ANAEROBIC Blood Culture results may not be optimal due to an excessive volume of blood received in culture bottles Performed at [facility], [address] [place], [place] [address]    Culture  Setup Time   Final    GRAM NEGATIVE RODS IN BOTH AEROBIC AND ANAEROBIC BOTTLES CRITICAL RESULT CALLED TO, READ BACK BY AND VERIFIED WITHPrincella Ion [facility] 5784 03/27/23 HNM Performed at [facility], [address] [place], [place] [address]    Culture ESCHERICHIA COLI (A)  Final   Report Status 03/29/2023 FINAL  Final   Organism ID, Bacteria ESCHERICHIA COLI  Final      Susceptibility   Escherichia coli - MIC*    AMPICILLIN <=2 SENSITIVE Sensitive     CEFEPIME <=0.12 SENSITIVE Sensitive     CEFTAZIDIME <=1 SENSITIVE Sensitive     CEFTRIAXONE <=0.25 SENSITIVE Sensitive     CIPROFLOXACIN <=0.25 SENSITIVE Sensitive     GENTAMICIN <=1 SENSITIVE Sensitive     IMIPENEM <=0.25 SENSITIVE Sensitive     TRIMETH/SULFA <=20 SENSITIVE Sensitive     AMPICILLIN/SULBACTAM <=2 SENSITIVE Sensitive     PIP/TAZO <=4 SENSITIVE Sensitive     * ESCHERICHIA COLI   Blood Culture ID Panel (Reflexed)     Status: Abnormal   Collection Time: 03/26/23  6:45 PM  Result Value Ref Range Status   Enterococcus faecalis NOT DETECTED NOT DETECTED Final   Enterococcus Faecium NOT DETECTED NOT DETECTED Final   Listeria monocytogenes NOT DETECTED NOT DETECTED Final   Staphylococcus species  NOT DETECTED NOT DETECTED Final   Staphylococcus aureus (BCID) NOT DETECTED NOT DETECTED Final   Staphylococcus epidermidis NOT DETECTED NOT DETECTED Final   Staphylococcus lugdunensis NOT DETECTED NOT DETECTED Final   Streptococcus species NOT DETECTED NOT DETECTED Final   Streptococcus agalactiae NOT DETECTED NOT DETECTED Final   Streptococcus pneumoniae NOT DETECTED NOT DETECTED Final   Streptococcus pyogenes NOT DETECTED NOT DETECTED Final   A.calcoaceticus-baumannii NOT DETECTED NOT DETECTED Final   Bacteroides fragilis NOT DETECTED NOT DETECTED Final   Enterobacterales DETECTED (A) NOT DETECTED Final    Comment: Enterobacterales represent a large order of gram negative bacteria, not a single organism. CRITICAL RESULT CALLED TO, READ BACK BY AND VERIFIED WITH: JASON ROBBINS PHARMD 0535 03/27/23 HNM    Enterobacter cloacae complex NOT DETECTED NOT DETECTED Final   Escherichia coli DETECTED (A) NOT DETECTED Final    Comment: CRITICAL RESULT CALLED TO, READ BACK BY AND VERIFIED WITH: Princella Ion PHARMD 0535 03/27/23 HNM    Klebsiella aerogenes NOT DETECTED NOT DETECTED Final   Klebsiella oxytoca NOT DETECTED NOT DETECTED Final   Klebsiella pneumoniae NOT DETECTED NOT DETECTED Final   Proteus species NOT DETECTED NOT DETECTED Final   Salmonella species NOT DETECTED NOT DETECTED Final   Serratia marcescens NOT DETECTED NOT DETECTED Final   Haemophilus influenzae NOT DETECTED NOT DETECTED Final   Neisseria meningitidis NOT DETECTED NOT DETECTED Final   Pseudomonas aeruginosa NOT DETECTED NOT DETECTED Final   Stenotrophomonas maltophilia NOT DETECTED NOT DETECTED Final    Candida albicans NOT DETECTED NOT DETECTED Final   Candida auris NOT DETECTED NOT DETECTED Final   Candida glabrata NOT DETECTED NOT DETECTED Final   Candida krusei NOT DETECTED NOT DETECTED Final   Candida parapsilosis NOT DETECTED NOT DETECTED Final   Candida tropicalis NOT DETECTED NOT DETECTED Final   Cryptococcus neoformans/gattii NOT DETECTED NOT DETECTED Final   CTX-M ESBL NOT DETECTED NOT DETECTED Final   Carbapenem resistance IMP NOT DETECTED NOT DETECTED Final   Carbapenem resistance KPC NOT DETECTED NOT DETECTED Final   Carbapenem resistance NDM NOT DETECTED NOT DETECTED Final   Carbapenem resist OXA 48 LIKE NOT DETECTED NOT DETECTED Final   Carbapenem resistance VIM NOT DETECTED NOT DETECTED Final    Comment: Performed at [facility], [address] [place], [place] [address]  Culture, blood (Routine X 2) w Reflex to ID Panel     Status: None (Preliminary result)   Collection Time: 03/29/23  8:12 AM   Specimen: BLOOD RIGHT HAND  Result Value Ref Range Status   Specimen Description BLOOD RIGHT HAND  Final   Special Requests   Final    BOTTLES DRAWN AEROBIC AND ANAEROBIC Blood Culture adequate volume   Culture   Final    NO GROWTH 4 DAYS Performed at [facility], [address] [place], [place] [address]    Report Status PENDING  Incomplete  Culture, blood (Routine X 2) w Reflex to ID Panel     Status: None (Preliminary result)   Collection Time: 03/29/23  8:12 AM   Specimen: BLOOD LEFT HAND  Result Value Ref Range Status   Specimen Description BLOOD LEFT HAND  Final   Special Requests   Final    BOTTLES DRAWN AEROBIC AND ANAEROBIC Blood Culture results may not be optimal due to an inadequate volume of blood received in culture bottles   Culture   Final    NO GROWTH 4 DAYS Performed at [facility], [address] [place], [place] [address]    Report Status PENDING  Incomplete    Labs: CBC: Recent Labs  Lab 03/28/23 0950  03/31/23 0649 04/01/23 0518  WBC 5.6 4.4 4.0  HGB 10.4* 9.6* 10.0*  HCT 30.5* 28.5* 29.8*  MCV 94.4 95.6 96.8  PLT 137* 140* 160   Basic Metabolic Panel: Recent Labs  Lab 03/28/23 0950 03/29/23 0812 03/30/23 0512 03/31/23 0649 04/01/23 0518  NA 131* 134* 137 135 136  K 3.3* 3.6 3.4* 3.7 4.0  CL 97* 99 104 105 105  CO2 23 23 21* 22 25  GLUCOSE 127* 124* 111* 115* 101*  BUN 13 21 27* 20 14  CREATININE 0.65 0.96 1.10* 0.84 0.75  CALCIUM 8.7* 9.2 8.7* 8.5* 8.7*  MG  --  1.7 1.9  --  1.7  PHOS  --   --  4.1  --   --    Liver Function Tests: No results for input(s): "AST", "ALT", "ALKPHOS", "BILITOT", "PROT", "ALBUMIN" in the last 168 hours. CBG: Recent Labs  Lab 04/02/23 0810 04/02/23 1200 04/02/23 1623 04/02/23 2256 04/03/23 0848  GLUCAP 113* 112* 116* 96 102*    Discharge time spent: greater than 30 minutes.  Signed: Marrion Coy, MD [facility] 04/03/2023

## 2023-04-03 NOTE — Care Management Important Message (Signed)
Important Message  Patient Details  Name: Jacqueline Tate MRN: [id number] Date of Birth: 1931-02-16   Medicare Important Message Given:  Yes  Patient asleep upon time of visit, no family in room.  Medicare IM left in room for reference.      Johnell Comings 04/03/2023, 10:53 AM

## 2023-04-05 ENCOUNTER — Other Ambulatory Visit: Payer: Self-pay

## 2023-04-05 DIAGNOSIS — Z515 Encounter for palliative care: Secondary | ICD-10-CM

## 2023-04-05 LAB — BLOOD GAS, VENOUS
Acid-Base Excess: 2.8 mmol/L — ABNORMAL HIGH (ref 0.0–2.0)
O2 Saturation: 22.9 %
Patient temperature: 37
pCO2, Ven: 47 mmHg (ref 44–60)
pH, Ven: 7.39 (ref 7.25–7.43)

## 2023-04-07 NOTE — Progress Notes (Signed)
PATIENT NAME: GINNI EICHLER DOB: May 20, 1931 MRN: [id number]  PRIMARY CARE PROVIDER: Kandyce Rud, MD  RESPONSIBLE PARTY:  Acct ID - Guarantor Home Phone Work Phone Relationship Acct Type  [id number] - Weathers,BOBB* [phone]  Self P/F     [address], [place], [place] [address]   I connected with Simonne Maffucci and [name] for  [facility] on 04/07/23 by telephone and verified that I am speaking with the correct person using two identifiers.   I discussed the limitations of evaluation and management by telemedicine. The patient expressed understanding and agreed to proceed.   Connected with Khaylee Mcevoy to follow up on Palliative Care Referral.  Daughter advises she just arrived at patient's home.  They have hired caregivers in place from 7am-6 pm and her and [name] are alternating staying with patient during the night.   Po intake has improved slightly and home health has started.   Israella Hubert has requested that I contact [name] as he handles all of patient's medical appointments.  Phone call made to [facility].  Discussed Palliative Care services and differences between hospice.  Discussed code status and son advised that this discussion was not completed in the hospital.  All children were to be contacted if everyone agreed with DNR status then patient would be changed.  Son advised some siblings did not respond the Palliative Care NP's call when patient was in the hospital so they decided to keep her a full code.  [name] shared his siblings have differing opinions on patient's situation so it has been difficult to make decisions.  [name] is open to having a Palliative Care visit next week.  Visit scheduled for Monday.    Truitt Merle, RN

## 2023-04-10 ENCOUNTER — Other Ambulatory Visit: Payer: Self-pay

## 2023-05-13 DEATH — deceased
